# Patient Record
Sex: Female | Born: 1955
Health system: Southern US, Community
[De-identification: ages and names within clinical notes are randomized; demographics above are authoritative.]

## PROBLEM LIST (undated history)

## (undated) ENCOUNTER — Ambulatory Visit: Admission: EM

## (undated) DIAGNOSIS — R739 Hyperglycemia, unspecified: Secondary | ICD-10-CM

## (undated) DIAGNOSIS — D649 Anemia, unspecified: Secondary | ICD-10-CM

## (undated) DIAGNOSIS — E785 Hyperlipidemia, unspecified: Secondary | ICD-10-CM

## (undated) DIAGNOSIS — O159 Eclampsia, unspecified as to time period: Secondary | ICD-10-CM

## (undated) DIAGNOSIS — E669 Obesity, unspecified: Secondary | ICD-10-CM

## (undated) DIAGNOSIS — K219 Gastro-esophageal reflux disease without esophagitis: Secondary | ICD-10-CM

## (undated) DIAGNOSIS — I10 Essential (primary) hypertension: Secondary | ICD-10-CM

## (undated) HISTORY — DX: Obesity, unspecified: E66.9

## (undated) HISTORY — DX: Gastro-esophageal reflux disease without esophagitis: K21.9

## (undated) HISTORY — DX: Hyperlipidemia, unspecified: E78.5

## (undated) HISTORY — DX: Anemia, unspecified: D64.9

## (undated) HISTORY — PX: OTHER SURGICAL HISTORY: SHX169

## (undated) HISTORY — DX: Essential (primary) hypertension: I10

## (undated) HISTORY — DX: Hyperglycemia, unspecified: R73.9

## (undated) HISTORY — DX: Eclampsia, unspecified as to time period: O15.9

## (undated) HISTORY — PX: ABDOMINAL HYSTERECTOMY: SHX81

---

## 1997-09-28 ENCOUNTER — Inpatient Hospital Stay (HOSPITAL_COMMUNITY): Admission: AD | Admit: 1997-09-28 | Discharge: 1997-10-13 | Payer: Self-pay | Admitting: Obstetrics and Gynecology

## 1999-06-04 ENCOUNTER — Other Ambulatory Visit: Admission: RE | Admit: 1999-06-04 | Discharge: 1999-06-04 | Payer: Self-pay | Admitting: Obstetrics and Gynecology

## 2000-08-15 ENCOUNTER — Other Ambulatory Visit: Admission: RE | Admit: 2000-08-15 | Discharge: 2000-08-15 | Payer: Self-pay | Admitting: Obstetrics and Gynecology

## 2000-09-12 ENCOUNTER — Encounter (INDEPENDENT_AMBULATORY_CARE_PROVIDER_SITE_OTHER): Payer: Self-pay

## 2000-09-12 ENCOUNTER — Other Ambulatory Visit: Admission: RE | Admit: 2000-09-12 | Discharge: 2000-09-12 | Payer: Self-pay | Admitting: Obstetrics and Gynecology

## 2001-05-12 ENCOUNTER — Encounter: Admission: RE | Admit: 2001-05-12 | Discharge: 2001-05-12 | Payer: Self-pay | Admitting: Obstetrics and Gynecology

## 2001-05-12 ENCOUNTER — Encounter: Payer: Self-pay | Admitting: Obstetrics and Gynecology

## 2002-03-19 ENCOUNTER — Encounter: Payer: Self-pay | Admitting: Internal Medicine

## 2002-05-03 ENCOUNTER — Other Ambulatory Visit: Admission: RE | Admit: 2002-05-03 | Discharge: 2002-05-03 | Payer: Self-pay | Admitting: Obstetrics and Gynecology

## 2003-02-11 ENCOUNTER — Encounter: Admission: RE | Admit: 2003-02-11 | Discharge: 2003-02-11 | Payer: Self-pay | Admitting: Obstetrics and Gynecology

## 2003-02-11 ENCOUNTER — Encounter: Payer: Self-pay | Admitting: Obstetrics and Gynecology

## 2003-06-10 ENCOUNTER — Ambulatory Visit (HOSPITAL_COMMUNITY): Admission: RE | Admit: 2003-06-10 | Discharge: 2003-06-10 | Payer: Self-pay | Admitting: Obstetrics and Gynecology

## 2004-03-01 ENCOUNTER — Other Ambulatory Visit: Admission: RE | Admit: 2004-03-01 | Discharge: 2004-03-01 | Payer: Self-pay | Admitting: Obstetrics and Gynecology

## 2004-03-14 ENCOUNTER — Encounter: Payer: Self-pay | Admitting: Internal Medicine

## 2004-04-12 ENCOUNTER — Encounter (INDEPENDENT_AMBULATORY_CARE_PROVIDER_SITE_OTHER): Payer: Self-pay | Admitting: *Deleted

## 2004-04-12 ENCOUNTER — Ambulatory Visit (HOSPITAL_COMMUNITY): Admission: RE | Admit: 2004-04-12 | Discharge: 2004-04-12 | Payer: Self-pay | Admitting: Obstetrics and Gynecology

## 2004-05-07 ENCOUNTER — Encounter: Admission: RE | Admit: 2004-05-07 | Discharge: 2004-05-07 | Payer: Self-pay | Admitting: Internal Medicine

## 2004-05-07 ENCOUNTER — Encounter: Payer: Self-pay | Admitting: Internal Medicine

## 2004-10-23 ENCOUNTER — Ambulatory Visit: Payer: Self-pay | Admitting: Internal Medicine

## 2004-11-21 ENCOUNTER — Observation Stay (HOSPITAL_COMMUNITY): Admission: RE | Admit: 2004-11-21 | Discharge: 2004-11-22 | Payer: Self-pay | Admitting: Obstetrics and Gynecology

## 2004-11-21 ENCOUNTER — Encounter (INDEPENDENT_AMBULATORY_CARE_PROVIDER_SITE_OTHER): Payer: Self-pay | Admitting: *Deleted

## 2004-12-03 ENCOUNTER — Encounter: Payer: Self-pay | Admitting: Obstetrics and Gynecology

## 2004-12-03 ENCOUNTER — Inpatient Hospital Stay (HOSPITAL_COMMUNITY): Admission: AD | Admit: 2004-12-03 | Discharge: 2004-12-06 | Payer: Self-pay | Admitting: Obstetrics and Gynecology

## 2005-05-17 ENCOUNTER — Encounter: Admission: RE | Admit: 2005-05-17 | Discharge: 2005-05-17 | Payer: Self-pay | Admitting: Obstetrics and Gynecology

## 2005-06-02 ENCOUNTER — Emergency Department (HOSPITAL_COMMUNITY): Admission: EM | Admit: 2005-06-02 | Discharge: 2005-06-02 | Payer: Self-pay | Admitting: Family Medicine

## 2005-07-08 ENCOUNTER — Other Ambulatory Visit: Admission: RE | Admit: 2005-07-08 | Discharge: 2005-07-08 | Payer: Self-pay | Admitting: Obstetrics and Gynecology

## 2005-07-23 ENCOUNTER — Ambulatory Visit: Payer: Self-pay | Admitting: Internal Medicine

## 2005-07-30 ENCOUNTER — Ambulatory Visit: Payer: Self-pay | Admitting: Internal Medicine

## 2005-10-29 ENCOUNTER — Ambulatory Visit: Payer: Self-pay | Admitting: Internal Medicine

## 2006-05-19 ENCOUNTER — Encounter: Admission: RE | Admit: 2006-05-19 | Discharge: 2006-05-19 | Payer: Self-pay | Admitting: Obstetrics and Gynecology

## 2006-06-16 ENCOUNTER — Other Ambulatory Visit: Admission: RE | Admit: 2006-06-16 | Discharge: 2006-06-16 | Payer: Self-pay | Admitting: Obstetrics and Gynecology

## 2006-07-09 ENCOUNTER — Ambulatory Visit (HOSPITAL_COMMUNITY): Admission: RE | Admit: 2006-07-09 | Discharge: 2006-07-09 | Payer: Self-pay | Admitting: Obstetrics and Gynecology

## 2006-07-25 ENCOUNTER — Ambulatory Visit: Payer: Self-pay | Admitting: Internal Medicine

## 2006-07-25 LAB — CONVERTED CEMR LAB
ALT: 24 units/L (ref 0–40)
AST: 25 units/L (ref 0–37)
Albumin: 3.8 g/dL (ref 3.5–5.2)
Alkaline Phosphatase: 54 units/L (ref 39–117)
BUN: 6 mg/dL (ref 6–23)
Basophils Absolute: 0 10*3/uL (ref 0.0–0.1)
Basophils Relative: 0.4 % (ref 0.0–1.0)
CO2: 31 meq/L (ref 19–32)
Calcium: 9.4 mg/dL (ref 8.4–10.5)
Chloride: 106 meq/L (ref 96–112)
Chol/HDL Ratio, serum: 4.8
Cholesterol: 204 mg/dL (ref 0–200)
Creatinine, Ser: 0.9 mg/dL (ref 0.4–1.2)
Eosinophil percent: 1.7 % (ref 0.0–5.0)
GFR calc non Af Amer: 71 mL/min
Glomerular Filtration Rate, Af Am: 86 mL/min/{1.73_m2}
Glucose, Bld: 100 mg/dL — ABNORMAL HIGH (ref 70–99)
HCT: 41.7 % (ref 36.0–46.0)
HDL: 42.5 mg/dL (ref >39.0)
Hemoglobin: 14.2 g/dL (ref 12.0–15.0)
LDL DIRECT: 135.8 mg/dL
Lymphocytes Relative: 41.1 % (ref 12.0–46.0)
MCHC: 33.9 g/dL (ref 30.0–36.0)
MCV: 95.6 fL (ref 78.0–100.0)
Monocytes Absolute: 0.3 10*3/uL (ref 0.2–0.7)
Monocytes Relative: 6.9 % (ref 3.0–11.0)
Neutro Abs: 2.2 10*3/uL (ref 1.4–7.7)
Neutrophils Relative %: 49.9 % (ref 43.0–77.0)
Platelets: 328 10*3/uL (ref 150–400)
Potassium: 4.4 meq/L (ref 3.5–5.1)
RBC: 4.37 M/uL (ref 3.87–5.11)
RDW: 11.4 % — ABNORMAL LOW (ref 11.5–14.6)
Sodium: 141 meq/L (ref 135–145)
TSH: 0.8 microintl units/mL (ref 0.35–5.50)
Total Bilirubin: 1 mg/dL (ref 0.3–1.2)
Total Protein: 6.9 g/dL (ref 6.0–8.3)
Triglyceride fasting, serum: 149 mg/dL (ref 0–149)
VLDL: 30 mg/dL (ref 0–40)
WBC: 4.4 10*3/uL — ABNORMAL LOW (ref 4.5–10.5)

## 2006-08-01 ENCOUNTER — Ambulatory Visit: Payer: Self-pay | Admitting: Internal Medicine

## 2007-01-19 DIAGNOSIS — I10 Essential (primary) hypertension: Secondary | ICD-10-CM | POA: Insufficient documentation

## 2007-02-11 ENCOUNTER — Ambulatory Visit: Payer: Self-pay | Admitting: Internal Medicine

## 2007-02-17 ENCOUNTER — Telehealth: Payer: Self-pay | Admitting: Internal Medicine

## 2007-04-10 ENCOUNTER — Ambulatory Visit: Payer: Self-pay | Admitting: Internal Medicine

## 2007-04-14 ENCOUNTER — Ambulatory Visit: Payer: Self-pay | Admitting: Internal Medicine

## 2007-05-04 ENCOUNTER — Ambulatory Visit: Payer: Self-pay | Admitting: Gastroenterology

## 2007-05-18 ENCOUNTER — Ambulatory Visit: Payer: Self-pay | Admitting: Gastroenterology

## 2007-05-18 ENCOUNTER — Encounter: Payer: Self-pay | Admitting: Internal Medicine

## 2007-05-21 ENCOUNTER — Encounter: Admission: RE | Admit: 2007-05-21 | Discharge: 2007-05-21 | Payer: Self-pay | Admitting: Obstetrics and Gynecology

## 2007-06-15 ENCOUNTER — Ambulatory Visit: Payer: Self-pay | Admitting: Internal Medicine

## 2007-06-17 ENCOUNTER — Telehealth: Payer: Self-pay | Admitting: Internal Medicine

## 2007-07-23 LAB — CONVERTED CEMR LAB: Pap Smear: NORMAL

## 2007-09-09 ENCOUNTER — Ambulatory Visit: Payer: Self-pay | Admitting: Internal Medicine

## 2007-09-11 LAB — CONVERTED CEMR LAB
BUN: 12 mg/dL (ref 6–23)
CO2: 29 meq/L (ref 19–32)
Calcium: 9.9 mg/dL (ref 8.4–10.5)
Chloride: 99 meq/L (ref 96–112)
Creatinine, Ser: 0.9 mg/dL (ref 0.4–1.2)
GFR calc Af Amer: 85 mL/min
GFR calc non Af Amer: 70 mL/min
Glucose, Bld: 93 mg/dL (ref 70–99)
Potassium: 4.3 meq/L (ref 3.5–5.1)
Sodium: 138 meq/L (ref 135–145)

## 2007-10-31 IMAGING — CT CT ABDOMEN W/ CM
2 of 5 series · 13 of 32 positions shown, 18 images · IV contrast (omnipaque)
Comparison: 12/03/04

CLINICAL DATA: Right-sided lower abdominal and pelvic pain.
 ABDOMEN CT WITH CONTRAST:
TECHNIQUE: Multidetector CT imaging of the abdomen was performed following the standard protocol during bolus administration of intravenous contrast.
 Contrast:  100 cc Omnipaque 300 and oral contrast.
TECHNIQUE: Multidetector CT imaging of the pelvis was performed following the standard protocol during bolus administration of intravenous contrast.

[Series 2: abd pelvis · axial · 0.70mm/px · z∈[-372,-122]mm · 5 of 76 slices shown, 10 images]
[im 13/76  soft-tissue]
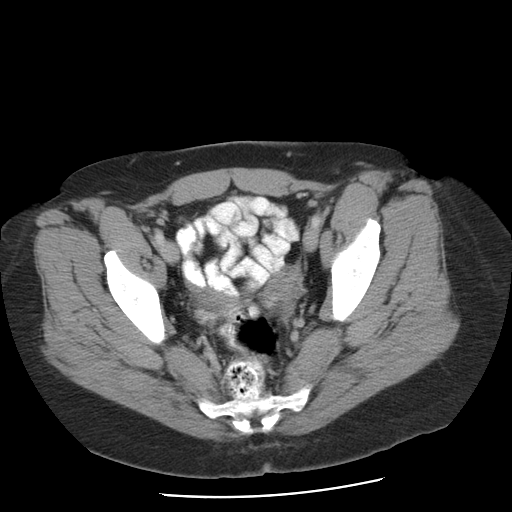
[im 13/76  bone]
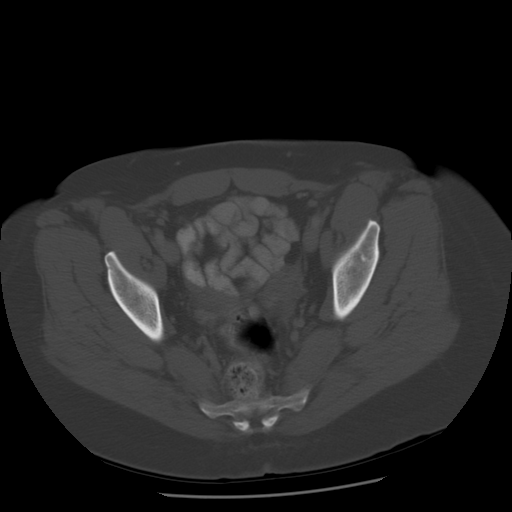
[im 26/76  soft-tissue]
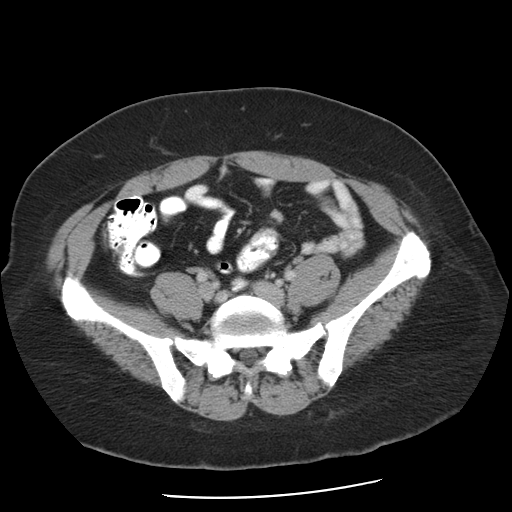
[im 26/76  lung]
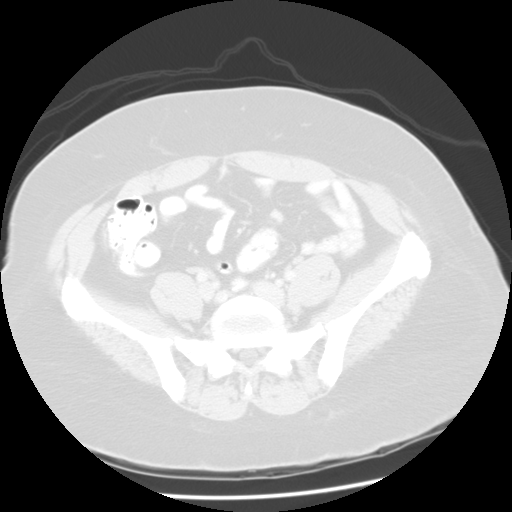
[im 38/76  soft-tissue]
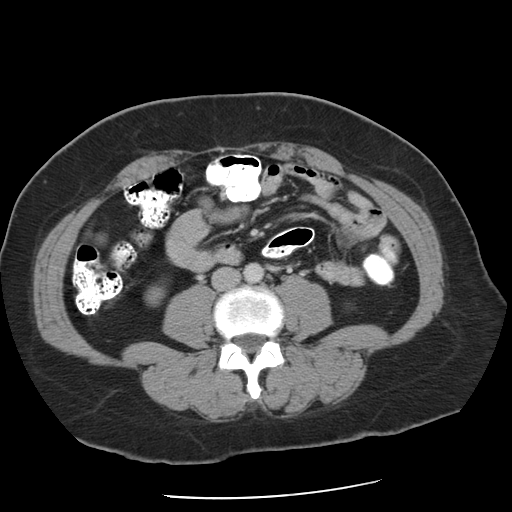
[im 38/76  lung]
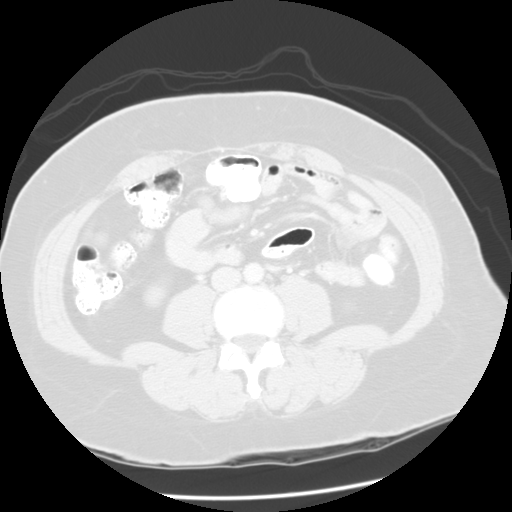
[im 51/76  soft-tissue]
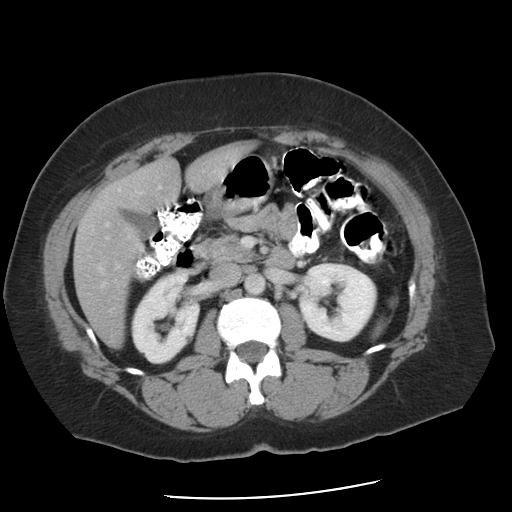
[im 51/76  lung]
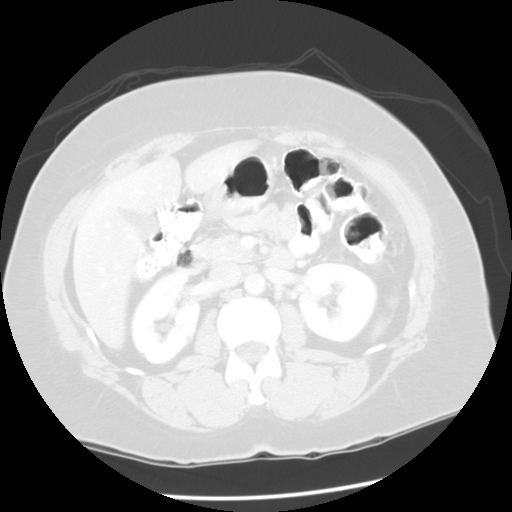
[im 63/76  soft-tissue]
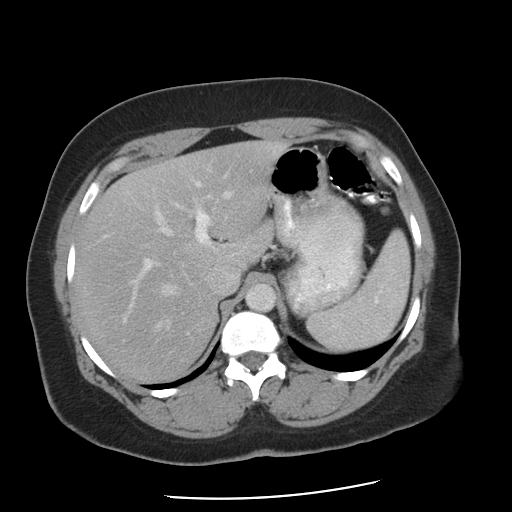
[im 63/76  lung]
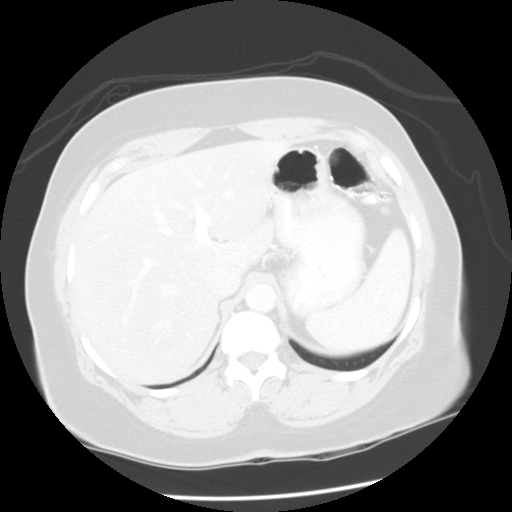

[Series 401: reformatted · sagittal · 0.80mm/px · 8 of 153 slices shown]
[im 13/153  soft-tissue]
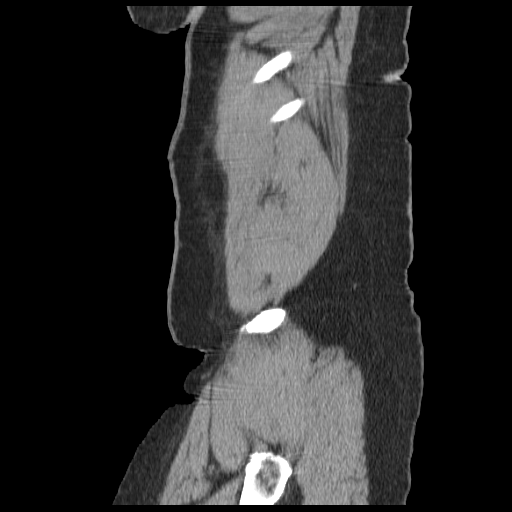
[im 39/153  soft-tissue]
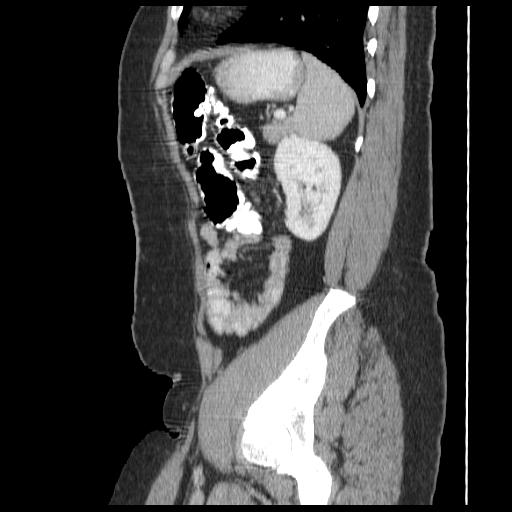
[im 51/153  soft-tissue]
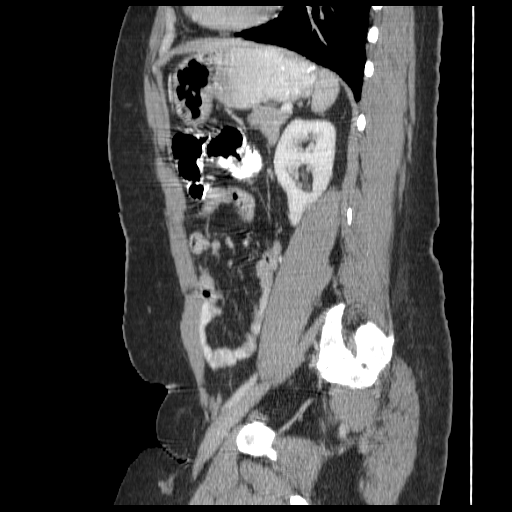
[im 64/153  soft-tissue]
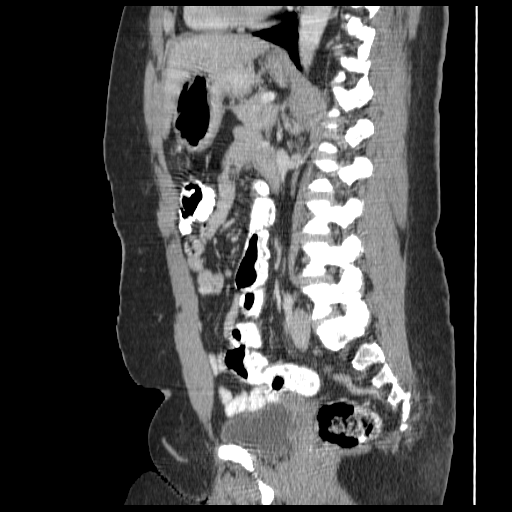
[im 89/153  soft-tissue]
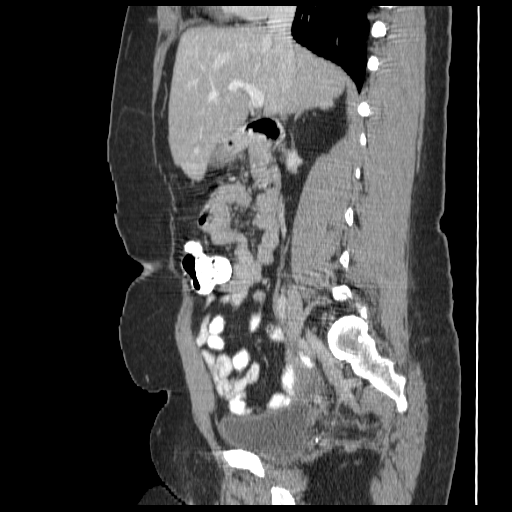
[im 102/153  soft-tissue]
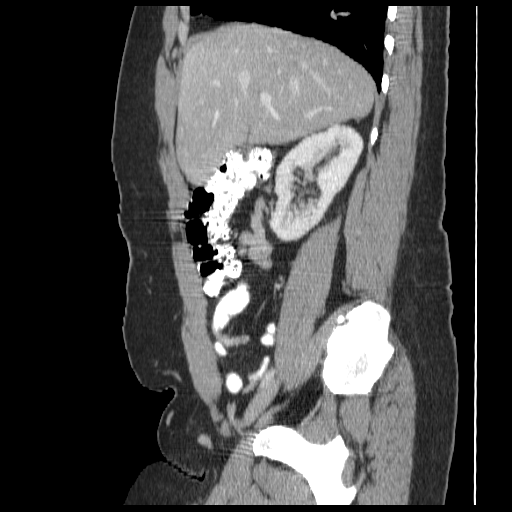
[im 115/153  soft-tissue]
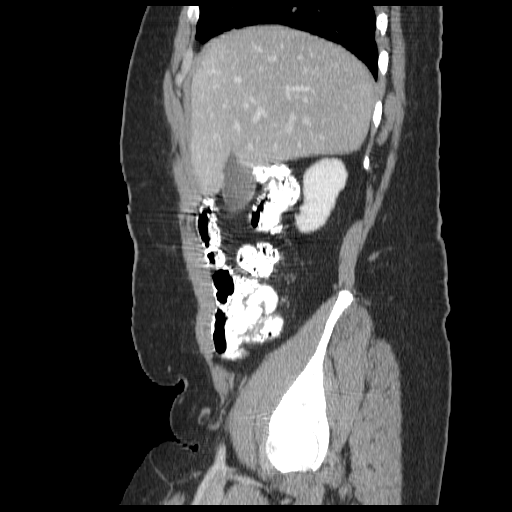
[im 140/153  soft-tissue]
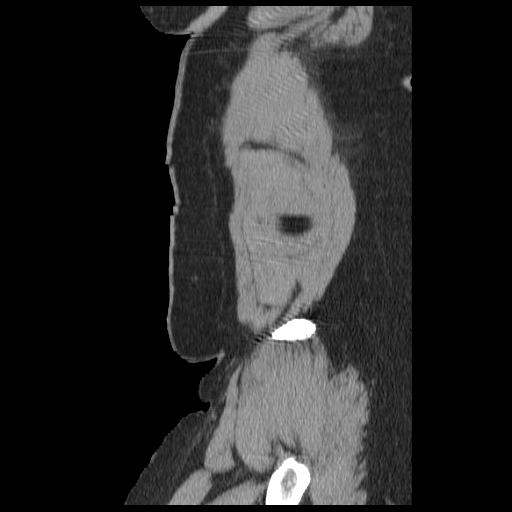

[13 of 32 positions shown; findings below may reference images not displayed]

FINDINGS: Tiny left hepatic lobe cysts remain stable. The liver is otherwise normal in appearance. Small right renal cysts are also unchanged. The abdominal parenchymal organs are otherwise normal in appearance.  There is no evidence of mass or adenopathy. There is no evidence of inflammatory process or abnormal fluid collections. The gallbladder is normal in appearance. The bowel loops are unremarkable.
IMPRESSION: 1.  No acute findings. 
 2.  Stable right renal and hepatic cysts. 
 PELVIS CT WITH CONTRAST:
FINDINGS: The patient has undergone a previous hysterectomy. There is no evidence of pelvic mass or lymphadenopathy.  There is no evidence of inflammatory process or abnormal fluid collections. Pelvic bowel loops are unremarkable in appearance. The abscess seen on the prior study has resolved.
IMPRESSION: Negative pelvis CT.

## 2008-02-26 ENCOUNTER — Ambulatory Visit: Payer: Self-pay | Admitting: Internal Medicine

## 2008-03-07 LAB — CONVERTED CEMR LAB
ALT: 24 units/L (ref 0–35)
AST: 21 units/L (ref 0–37)
Albumin: 4.1 g/dL (ref 3.5–5.2)
Alkaline Phosphatase: 61 units/L (ref 39–117)
BUN: 12 mg/dL (ref 6–23)
Basophils Absolute: 0 10*3/uL (ref 0.0–0.1)
Basophils Relative: 0.5 % (ref 0.0–3.0)
Bilirubin, Direct: 0.1 mg/dL (ref 0.0–0.3)
CO2: 31 meq/L (ref 19–32)
Calcium: 9.5 mg/dL (ref 8.4–10.5)
Chloride: 104 meq/L (ref 96–112)
Cholesterol: 173 mg/dL (ref 0–200)
Creatinine, Ser: 1.1 mg/dL (ref 0.4–1.2)
Eosinophils Absolute: 0 10*3/uL (ref 0.0–0.7)
Eosinophils Relative: 0.5 % (ref 0.0–5.0)
GFR calc Af Amer: 67 mL/min
GFR calc non Af Amer: 56 mL/min
Glucose, Bld: 112 mg/dL — ABNORMAL HIGH (ref 70–99)
HCT: 37.4 % (ref 36.0–46.0)
HDL: 44.1 mg/dL (ref >39.0)
Hemoglobin: 13 g/dL (ref 12.0–15.0)
LDL Cholesterol: 109 mg/dL — ABNORMAL HIGH (ref 0–99)
Lymphocytes Relative: 34.2 % (ref 12.0–46.0)
MCHC: 34.7 g/dL (ref 30.0–36.0)
MCV: 94.7 fL (ref 78.0–100.0)
Monocytes Absolute: 0.2 10*3/uL (ref 0.1–1.0)
Monocytes Relative: 6.9 % (ref 3.0–12.0)
Neutro Abs: 2 10*3/uL (ref 1.4–7.7)
Neutrophils Relative %: 57.9 % (ref 43.0–77.0)
Platelets: 327 10*3/uL (ref 150–400)
Potassium: 3.6 meq/L (ref 3.5–5.1)
RBC: 3.94 M/uL (ref 3.87–5.11)
RDW: 11.7 % (ref 11.5–14.6)
Sodium: 140 meq/L (ref 135–145)
TSH: 0.56 microintl units/mL (ref 0.35–5.50)
Total Bilirubin: 1.2 mg/dL (ref 0.3–1.2)
Total CHOL/HDL Ratio: 3.9
Total Protein: 7.3 g/dL (ref 6.0–8.3)
Triglycerides: 99 mg/dL (ref 0–149)
VLDL: 20 mg/dL (ref 0–40)
WBC: 3.4 10*3/uL — ABNORMAL LOW (ref 4.5–10.5)

## 2008-04-08 ENCOUNTER — Ambulatory Visit: Payer: Self-pay | Admitting: Internal Medicine

## 2008-04-08 LAB — CONVERTED CEMR LAB
Bilirubin Urine: NEGATIVE
Glucose, Urine, Semiquant: NEGATIVE
Ketones, urine, test strip: NEGATIVE
Nitrite: POSITIVE
Specific Gravity, Urine: 1.02
Urobilinogen, UA: 1
pH: 7

## 2008-04-12 ENCOUNTER — Telehealth: Payer: Self-pay | Admitting: Internal Medicine

## 2008-04-13 ENCOUNTER — Ambulatory Visit: Payer: Self-pay | Admitting: Internal Medicine

## 2008-04-19 LAB — CONVERTED CEMR LAB
Bilirubin Urine: NEGATIVE
Blood in Urine, dipstick: NEGATIVE
Glucose, Urine, Semiquant: NEGATIVE
Ketones, urine, test strip: NEGATIVE
Nitrite: NEGATIVE
Protein, U semiquant: NEGATIVE
Specific Gravity, Urine: 1.01
Urobilinogen, UA: 0.2
pH: 7

## 2008-05-23 ENCOUNTER — Encounter: Admission: RE | Admit: 2008-05-23 | Discharge: 2008-05-23 | Payer: Self-pay | Admitting: Obstetrics and Gynecology

## 2008-07-06 LAB — HM PAP SMEAR

## 2008-09-22 ENCOUNTER — Encounter: Payer: Self-pay | Admitting: Internal Medicine

## 2008-11-08 ENCOUNTER — Ambulatory Visit: Payer: Self-pay | Admitting: Internal Medicine

## 2008-11-21 ENCOUNTER — Ambulatory Visit: Payer: Self-pay

## 2008-11-21 ENCOUNTER — Encounter: Payer: Self-pay | Admitting: Internal Medicine

## 2009-04-07 ENCOUNTER — Ambulatory Visit: Payer: Self-pay | Admitting: Internal Medicine

## 2009-04-19 ENCOUNTER — Ambulatory Visit: Payer: Self-pay | Admitting: Internal Medicine

## 2009-04-21 ENCOUNTER — Telehealth: Payer: Self-pay | Admitting: *Deleted

## 2009-04-24 ENCOUNTER — Encounter: Admission: RE | Admit: 2009-04-24 | Discharge: 2009-04-24 | Payer: Self-pay | Admitting: Internal Medicine

## 2009-05-02 ENCOUNTER — Encounter: Admission: RE | Admit: 2009-05-02 | Discharge: 2009-06-13 | Payer: Self-pay | Admitting: Internal Medicine

## 2009-05-03 ENCOUNTER — Encounter: Payer: Self-pay | Admitting: Internal Medicine

## 2009-05-24 ENCOUNTER — Encounter: Admission: RE | Admit: 2009-05-24 | Discharge: 2009-05-24 | Payer: Self-pay | Admitting: Obstetrics and Gynecology

## 2009-05-29 ENCOUNTER — Ambulatory Visit: Payer: Self-pay | Admitting: Internal Medicine

## 2009-06-02 ENCOUNTER — Encounter (INDEPENDENT_AMBULATORY_CARE_PROVIDER_SITE_OTHER): Payer: Self-pay | Admitting: *Deleted

## 2009-06-13 ENCOUNTER — Encounter (INDEPENDENT_AMBULATORY_CARE_PROVIDER_SITE_OTHER): Payer: Self-pay | Admitting: *Deleted

## 2009-07-16 ENCOUNTER — Encounter: Payer: Self-pay | Admitting: Internal Medicine

## 2009-10-18 ENCOUNTER — Encounter: Payer: Self-pay | Admitting: Internal Medicine

## 2009-10-25 ENCOUNTER — Encounter: Payer: Self-pay | Admitting: Internal Medicine

## 2010-01-17 ENCOUNTER — Ambulatory Visit: Payer: Self-pay | Admitting: Family Medicine

## 2010-05-25 ENCOUNTER — Encounter: Admission: RE | Admit: 2010-05-25 | Discharge: 2010-05-25 | Payer: Self-pay | Admitting: Obstetrics and Gynecology

## 2010-05-25 LAB — HM MAMMOGRAPHY

## 2010-07-31 ENCOUNTER — Other Ambulatory Visit: Payer: Self-pay | Admitting: Internal Medicine

## 2010-07-31 ENCOUNTER — Ambulatory Visit
Admission: RE | Admit: 2010-07-31 | Discharge: 2010-07-31 | Payer: Self-pay | Source: Home / Self Care | Attending: Internal Medicine | Admitting: Internal Medicine

## 2010-07-31 LAB — LIPID PANEL
Cholesterol: 186 mg/dL (ref 0–200)
HDL: 54.7 mg/dL (ref >39.00)
LDL Cholesterol: 107 mg/dL — ABNORMAL HIGH (ref 0–99)
Total CHOL/HDL Ratio: 3
Triglycerides: 122 mg/dL (ref 0.0–149.0)
VLDL: 24.4 mg/dL (ref 0.0–40.0)

## 2010-07-31 LAB — BASIC METABOLIC PANEL
BUN: 13 mg/dL (ref 6–23)
CO2: 31 mEq/L (ref 19–32)
Calcium: 9.4 mg/dL (ref 8.4–10.5)
Chloride: 106 mEq/L (ref 96–112)
Creatinine, Ser: 0.8 mg/dL (ref 0.4–1.2)
GFR: 94.85 mL/min (ref >60.00)
Glucose, Bld: 79 mg/dL (ref 70–99)
Potassium: 4.6 mEq/L (ref 3.5–5.1)
Sodium: 142 mEq/L (ref 135–145)

## 2010-07-31 LAB — CBC WITH DIFFERENTIAL/PLATELET
Basophils Absolute: 0 10*3/uL (ref 0.0–0.1)
Basophils Relative: 0.3 % (ref 0.0–3.0)
Eosinophils Absolute: 0.1 10*3/uL (ref 0.0–0.7)
Eosinophils Relative: 1.2 % (ref 0.0–5.0)
HCT: 35.9 % — ABNORMAL LOW (ref 36.0–46.0)
Hemoglobin: 12.3 g/dL (ref 12.0–15.0)
Lymphocytes Relative: 39.9 % (ref 12.0–46.0)
Lymphs Abs: 1.7 10*3/uL (ref 0.7–4.0)
MCHC: 34.2 g/dL (ref 30.0–36.0)
MCV: 95.4 fl (ref 78.0–100.0)
Monocytes Absolute: 0.3 10*3/uL (ref 0.1–1.0)
Monocytes Relative: 7.8 % (ref 3.0–12.0)
Neutro Abs: 2.1 10*3/uL (ref 1.4–7.7)
Neutrophils Relative %: 50.8 % (ref 43.0–77.0)
Platelets: 303 10*3/uL (ref 150.0–400.0)
RBC: 3.76 Mil/uL — ABNORMAL LOW (ref 3.87–5.11)
RDW: 12.9 % (ref 11.5–14.6)
WBC: 4.2 10*3/uL — ABNORMAL LOW (ref 4.5–10.5)

## 2010-07-31 LAB — HEPATIC FUNCTION PANEL
ALT: 16 U/L (ref 0–35)
AST: 18 U/L (ref 0–37)
Albumin: 3.8 g/dL (ref 3.5–5.2)
Alkaline Phosphatase: 59 U/L (ref 39–117)
Bilirubin, Direct: 0.1 mg/dL (ref 0.0–0.3)
Total Bilirubin: 1 mg/dL (ref 0.3–1.2)
Total Protein: 6.5 g/dL (ref 6.0–8.3)

## 2010-07-31 LAB — TSH: TSH: 0.55 u[IU]/mL (ref 0.35–5.50)

## 2010-08-21 NOTE — Letter (Signed)
Summary: [facility]  [facility]   Imported By: Maryln Gottron 11/20/2009 09:52:00  _____________________________________________________________________  External Attachment:    Type:   Image     Comment:   External Document

## 2010-08-21 NOTE — Assessment & Plan Note (Signed)
Summary: abd pain/ok per dm/njr   Vital Signs:  Patient profile:   55 year old female Weight:      185 pounds O2 Sat:      91 % Temp:     98.2 degrees F Pulse rate:   93 / minute BP sitting:   108 / 78  Vitals Entered By: Pura Spice, RN (January 17, 2010 3:41 PM) CC: upper epigastric pain that started this AM states better now but area feels tender   History of Present Illness: History 33-year-old African American female complains of epigastric pain her pain up into her sternal area of her chest and has increased in severity over the past week she relates she has never had pain of this nature. She has no nausea vomiting no diarrhea no cough. On questioning she relates at home some movement she does have pain in this area  Allergies (verified): No Known Drug Allergies  Past History:  Past Medical History: Last updated: 01/19/2007 Anemia-iron deficiency Hypertension pre-eclampsia  Past Surgical History: Last updated: 01/19/2007 Hysterectomy, post op infection  Social History: Last updated: 02/11/2007 Occupation: HR mgr  Risk Factors: Smoking Status: quit > 6 months (05/29/2009)  Review of Systems      See HPI  The patient denies anorexia, fever, weight loss, weight gain, vision loss, decreased hearing, hoarseness, chest pain, syncope, dyspnea on exertion, peripheral edema, prolonged cough, headaches, hemoptysis, abdominal pain, melena, hematochezia, severe indigestion/heartburn, hematuria, incontinence, genital sores, muscle weakness, suspicious skin lesions, transient blindness, difficulty walking, depression, unusual weight change, abnormal bleeding, enlarged lymph nodes, angioedema, breast masses, and testicular masses.    Physical Exam  General:  Well-developed,well-nourished,in no acute distress; alert,appropriate and cooperative throughout examinationoverweight-appearing.   Neck:  No deformities, masses, or tenderness noted. Chest Wall:  marked tenderness over the  left costochondral area of the chest 5 through 8 Lungs:  Normal respiratory effort, chest expands symmetrically. Lungs are clear to auscultation, no crackles or wheezes. Heart:  Normal rate and regular rhythm. S1 and S2 normal without gallop, murmur, click, rub or other extra sounds. Abdomen:  Bowel sounds positive,abdomen soft and non-tender without masses, organomegaly or hernias noted. Msk:  has tenderness over the right sacroiliac joint   Impression & Recommendations:  Problem # 1:  COSTOCHONDRITIS, LEFT (ICD-733.6) Assessment New diclofenac 75 mg b.i.d.  Problem # 2:  SACROILIAC STRAIN (ICD-846.9) Assessment: New diclofenac 75 mg b.i.d. p.c.  Complete Medication List: 1)  Benazepril Hcl 20 Mg Tabs (Benazepril hcl) .... Take 1 tablet by mouth once a day 2)  Hydrochlorothiazide 25 Mg Tabs (Hydrochlorothiazide) .... Take 1 tablet by mouth once a day 3)  Amlodipine Besylate 5 Mg Tabs (Amlodipine besylate) .... Take 1 tablet by mouth once a day 4)  Diclofenac Sodium 75 Mg Tbec (Diclofenac sodium) .Marland Kitchen.. 1 two times a day, am and pm for infkamation chest wall and sacroiliac joints  Patient Instructions: 1)  CHEST PAIN IS COSTOCHONDRITIS WHICH WILL RESPOND TO DICLOFENAC AS i HVE PRESCRIBED 2)  It will also helpSaro iliac inflammation and stop tingling in feet Prescriptions: DICLOFENAC SODIUM 75 MG TBEC (DICLOFENAC SODIUM) 1 two times a day, AM and PM for infkamation chest wall AND SACROILIAC JOINTS  #60 x 5   Entered and Authorized by:   Judithann Sheen MD   Signed by:   Judithann Sheen MD on 01/17/2010   Method used:   Electronically to        [facility] [address] (retail)  [address]       [place], [place]  [id number]       Ph: [phone]       Fax: [phone]   RxID:   [id number]

## 2010-08-21 NOTE — Letter (Signed)
Summary: [facility]  [facility]   Imported By: Maryln Gottron 11/08/2009 14:01:21  _____________________________________________________________________  External Attachment:    Type:   Image     Comment:   External Document

## 2010-08-23 NOTE — Assessment & Plan Note (Signed)
Summary: med check//ccm   Vital Signs:  Patient profile:   55 year old female Weight:      188 pounds Temp:     97.7 degrees F oral Pulse rate:   68 / minute Pulse rhythm:   regular BP sitting:   128 / 80  (left arm) Cuff size:   regular  Vitals Entered By: Azucena Freed,  MA Student CC: med check    CC:  med check .  History of Present Illness:  Follow-Up Visit      This is a 55 year old woman who presents for Follow-up visit.  The patient denies chest pain and palpitations.  Since the last visit the patient notes no new problems or concerns.  The patient reports taking meds as prescribed and dietary compliance.  When questioned about possible medication side effects, the patient notes none.  has not taken BP meds in 3 days.   All other systems reviewed and were negative   Current Problems (verified): 1)  Preventive Health Care  (ICD-V70.0) 2)  Hypertension  (ICD-401.9) 3)  Anemia-iron Deficiency  (ICD-280.9)  Medications Prior to Update: 1)  Benazepril Hcl 20 Mg Tabs (Benazepril Hcl) .... Take 1 Tablet By Mouth Once A Day 2)  Hydrochlorothiazide 25 Mg  Tabs (Hydrochlorothiazide) .... Take 1 Tablet By Mouth Once A Day 3)  Amlodipine Besylate 5 Mg Tabs (Amlodipine Besylate) .... Take 1 Tablet By Mouth Once A Day 4)  Diclofenac Sodium 75 Mg Tbec (Diclofenac Sodium) .Marland Kitchen.. 1 Two Times A Day, Am and Pm For Infkamation Chest Wall and Sacroiliac Joints  Current Medications (verified): 1)  Benazepril Hcl 20 Mg Tabs (Benazepril Hcl) .... Take 1 Tablet By Mouth Once A Day 2)  Hydrochlorothiazide 25 Mg  Tabs (Hydrochlorothiazide) .... Take 1 Tablet By Mouth Once A Day 3)  Amlodipine Besylate 5 Mg Tabs (Amlodipine Besylate) .... Take 1 Tablet By Mouth Once A Day 4)  Diclofenac Sodium 75 Mg Tbec (Diclofenac Sodium) .Marland Kitchen.. 1 Two Times A Day, Am and Pm For Infkamation Chest Wall and Sacroiliac Joints  Allergies (verified): No Known Drug Allergies  Past History:  Past Medical  History: Last updated: 01/19/2007 Anemia-iron deficiency Hypertension pre-eclampsia  Family History: Last updated: 03/05/07 mother deceased 84 Family History Diabetes 1st degree relative-mother father-lever disease 41  Social History: Last updated: Mar 05, 2007 Occupation: HR mgr  Risk Factors: Smoking Status: quit > 6 months (05/29/2009)  Past Surgical History: Hysterectomy, post op infection shoulder surgery  Physical Exam  General:   overweight female in no acute distress. HEENT exam atraumatic, normocephalic symmetric muscles are intact her neck is supple. Chest clear to auscultation cardiac exam S1 and S2 are regular. Abdominal exam overweight white bowel sounds, soft. Extremities no edema.   Impression & Recommendations:  Problem # 1:  HYPERTENSION (ICD-401.9)  Her updated medication list for this problem includes:    Benazepril Hcl 20 Mg Tabs (Benazepril hcl) .Marland Kitchen... Take 1 tablet by mouth once a day    Hydrochlorothiazide 25 Mg Tabs (Hydrochlorothiazide) .Marland Kitchen... Take 1 tablet by mouth once a day    Amlodipine Besylate 5 Mg Tabs (Amlodipine besylate) .Marland Kitchen... Take 1 tablet by mouth once a day  Orders: Venipuncture ([address]109) TLB-Lipid Panel (80061-LIPID) TLB-CBC Platelet - w/Differential (85025-CBCD) TLB-BMP (Basic Metabolic Panel-BMET) (80048-METABOL) TLB-Hepatic/Liver Function Pnl (80076-HEPATIC) TLB-TSH (Thyroid Stimulating Hormone) (84443-TSH)  Complete Medication List: 1)  Benazepril Hcl 20 Mg Tabs (Benazepril hcl) .... Take 1 tablet by mouth once a day 2)  Hydrochlorothiazide 25 Mg  Tabs (Hydrochlorothiazide) .... Take 1 tablet by mouth once a day 3)  Amlodipine Besylate 5 Mg Tabs (Amlodipine besylate) .... Take 1 tablet by mouth once a day 4)  Diclofenac Sodium 75 Mg Tbec (Diclofenac sodium) .Marland Kitchen.. 1 two times a day, am and pm for infkamation chest wall and sacroiliac joints  Patient Instructions: 1)  . Prescriptions: AMLODIPINE BESYLATE 5 MG TABS (AMLODIPINE  BESYLATE) Take 1 tablet by mouth once a day  #90 x 3   Entered and Authorized by:   Birdie Sons MD   Signed by:   Birdie Sons MD on 07/31/2010   Method used:   Electronically to        [facility] (retail)       [address]       [place], [place]  [id number]       Ph: [phone]       Fax: [phone]   RxID:   [id number] HYDROCHLOROTHIAZIDE 25 MG  TABS (HYDROCHLOROTHIAZIDE) Take 1 tablet by mouth once a day  #90 Tablet x 3   Entered and Authorized by:   Birdie Sons MD   Signed by:   Birdie Sons MD on 07/31/2010   Method used:   Electronically to        [facility] (retail)       [address]       [place], [place]  [id number]       Ph: [phone]       Fax: [phone]   RxID:   [id number] BENAZEPRIL HCL 20 MG TABS (BENAZEPRIL HCL) Take 1 tablet by mouth once a day  #90 x 3   Entered and Authorized by:   Birdie Sons MD   Signed by:   Birdie Sons MD on 07/31/2010   Method used:   Electronically to        [facility] (retail)       [address]       [place], [place]  [id number]       Ph: [phone]       Fax: [phone]   RxID:   [id number]    Orders Added: 1)  Venipuncture [62694] 2)  TLB-Lipid Panel [80061-LIPID] 3)  TLB-CBC Platelet - w/Differential [85025-CBCD] 4)  TLB-BMP (Basic Metabolic Panel-BMET) [80048-METABOL] 5)  TLB-Hepatic/Liver Function Pnl [80076-HEPATIC] 6)  TLB-TSH (Thyroid Stimulating Hormone) [84443-TSH] 7)  Est. Patient Level III [85462]  Appended Document: Orders Update     Clinical Lists Changes  Orders: Added new Service order of Specimen Handling (70350) - Signed

## 2010-12-07 NOTE — H&P (Signed)
NAMEYENNIFER, Erica Sutton                  ACCOUNT NO.:  [id number]   MEDICAL RECORD NO.:  [id number]          PATIENT TYPE:  INP   LOCATION:  9320                          [facility]ILITY:  [facility]   PHYSICIAN:  Janine Limbo, M.D.DATE OF BIRTH:  [birth year]   DATE OF ADMISSION:  12/03/2004  DATE OF DISCHARGE:                                HISTORY & PHYSICAL   HISTORY OF PRESENT ILLNESS:  [name] is a 55 year old female, para 3-1-1-3,  who presents complaining of abdominal pain and fever.  The patient had an  uneventful vaginal hysterectomy on Nov 21, 2004.  Operative findings included  a fibroid uterus.  The patient reports that on Dec 02, 2004, she had a  temperature of 103.  She presented to the office today for evaluation.  She  reports that she is having constipation, but no rectal bleeding. She is  having daily bowel movements.  She denies nausea and vomiting.  She denies  urinary tract infection symptoms.   PAST MEDICAL HISTORY:  Significant for hysteroscopy with dilatation and  curettage in September of 2005.  Her pathology report was benign.  The  patient had a normal Pap smear in August of 2005.  The patient has  hypertension.   OBSTETRICAL HISTORY:  The patient had a cesarean section in 1999.  She has  had three normal spontaneous vaginal deliveries.  She had a miscarriage in  1988.   ALLERGIES:  No known drug allergies.   CURRENT MEDICATIONS:  1. Norvasc 5 mg each day.  2. Toprol 50 mg each day.     SOCIAL HISTORY:  The patient denies cigarette use, alcohol use, and  recreational drug use.   REVIEW OF SYSTEMS:  Please see history of present illness.   FAMILY HISTORY:  Noncontributory.   PHYSICAL EXAMINATION:  VITAL SIGNS:  Temperature 98.3, pulse 93,  respirations 18, blood pressure 147/99.  HEENT:  Within normal limits.  CHEST:  Clear.  HEART:  Regular rate and rhythm.  ABDOMEN:  Soft and nontender.  EXTREMITIES:  Grossly normal.  NEUROLOGY:  Grossly normal.  PELVIC:  External genitalia is normal.  Vagina is normal.  Cervix is absent.  Uterus is absent.  The bimanual examination shows appropriate tenderness,  but no masses were appreciated.   LABORATORY DATA:  Hemoglobin 10.5, white blood cell count 16,200, hematocrit  33.4%, platelet count 582,000.  Chemistries are within normal limits except  for a glucose of 101.  CT scan of the abdomen and pelvis shows a 6 cm pelvic  mass at the vaginal cuff consistent with a pelvic abscess.  Bowel was noted  anteriorly such that the radiologist felt that it would be difficult to do a  transabdominal drainage of the mass.  Large blood vessels were noted  posteriorly and there was concern that a gluteal approach to the mass would  be difficult.   ASSESSMENT:  1. Pelvic abscess.  2. Status post vaginal hysterectomy on Nov 21, 2004.  3. Hypertension.     PLAN:  The patient will be admitted for IV Zosyn.  She will be  made NPO  after midnight tonight.  I have spoken with the radiologist and they will  consider trying a transvaginal approach to the pelvic abscess.  If they feel  that they will not be able to perform this procedure, then we will consider  taking the patient back to the operating room and opening the vaginal cuff  to drain the abscess.      AVS/MEDQ  D:  12/03/2004  T:  12/03/2004  Job:  [id number]

## 2010-12-07 NOTE — H&P (Signed)
NAME:  Erica Sutton, Erica Sutton                  ACCOUNT NO.:  [id number]   MEDICAL RECORD NO.:  [id number]          PATIENT TYPE:  INP   LOCATION:  NA                            [facility]   PHYSICIAN:  Janine Limbo, M.D.DATE OF BIRTH:  Oct 03, 1955   DATE OF ADMISSION:  11/22/2004  DATE OF DISCHARGE:                                HISTORY & PHYSICAL   HISTORY OF PRESENT ILLNESS:  Erica Sutton is a 55 year old female, para 3-1-1-3,  who presents for a vaginal hysterectomy. The patient has been followed at  the [facility] division of [facility]. She  complains of menorrhagia, anemia, and fibroids. The patient had hysteroscopy  and dilatation and curettage in September of 2005. The pathology report  returned showing microglandular hyperplasia. There was no evidence of  malignancy. An ultrasound was performed consistent with the above-mentioned  diagnosis. The patient's most recent Pap smear was in August of 2005 and it  was within normal limits.   The patient has not responded to hormonal therapy nor nonsteroidal anti-  inflammatory agents.   OBSTETRICAL HISTORY:  The patient had a cesarean section in 1999 for  premature infant. Prior to that, she has had three normal spontaneous  vaginal deliveries. She had a miscarriage in 1998 in the first trimester.   ALLERGIES:  None known.   PAST MEDICAL HISTORY:  The patient has hypertension.   CURRENT MEDICATIONS:  Norvasc, Toprol, and iron therapy.   SOCIAL HISTORY:  The patient denies cigarette use, alcohol use, and  recreational drug use.   REVIEW OF SYMPTOMS:  The patient complains of fatigue as well as irregular  cycles.   FAMILY HISTORY:  Noncontributory.   PHYSICAL EXAMINATION:  VITAL SIGNS:  Weight is 192 pounds.  HEENT:  Within normal limits.  CHEST:  Clear.  HEART:  Regular rate and rhythm.  BREASTS:  Without masses.  ABDOMEN:  Nontender.  EXTREMITIES:  Within normal limits.  NEUROLOGICAL:   Grossly normal.  PELVIC:  External genitalia is normal. The vagina is normal. Cervix is  nontender. The uterus is 10 to 12 weeks size and irregular. Adnexa has no  masses. Rectovaginal exam confirms.   ASSESSMENT:  1.  Menorrhagia.  2.  Anemia.  3.  Fibroid uterus.   PLAN:  The patient will undergo a vaginal hysterectomy. She wishes to keep  her ovaries if they appear normal but agrees to have an oophorectomy  performed if they are abnormal. The patient understands the indications for  her procedure and she accepts the risks of, but not limited to, anesthetic  complications, bleeding, infections, and possible damage to the surrounding  organs.      AVS/MEDQ  D:  11/20/2004  T:  11/20/2004  Job:  [id number]

## 2010-12-07 NOTE — H&P (Signed)
NAME:  Erica Sutton, Erica Sutton                  ACCOUNT NO.:  [id number]   MEDICAL RECORD NO.:  [id number]          PATIENT TYPE:  INP   LOCATION:  NA                            [facility]   PHYSICIAN:  Janine Limbo, M.D.DATE OF BIRTH:  Nov 28, 1955   DATE OF ADMISSION:  11/22/2004  DATE OF DISCHARGE:                                HISTORY & PHYSICAL   HISTORY OF PRESENT ILLNESS:  Ms. Storbeck is a 55 year old female, para 3-1-1-3,  who presents for a vaginal hysterectomy because of menorrhagia, anemia, and  fibroids. The patient has been followed at the [facility]  division of [facility]. The patient has had a NovaSure  endometrial ablation but this did not stop her bleeding.   Dictation ended at this point.      AVS/MEDQ  D:  11/20/2004  T:  11/20/2004  Job:  [id number]

## 2010-12-07 NOTE — Discharge Summary (Signed)
NAMEDREMA, EDDINGTON                  ACCOUNT NO.:  [id number]   MEDICAL RECORD NO.:  [id number]          PATIENT TYPE:  OBV   LOCATION:  9310                          [facility]   PHYSICIAN:  Janine Limbo, M.D.DATE OF BIRTH:  [birth year]   DATE OF ADMISSION:  11/21/2004  DATE OF DISCHARGE:  11/22/2004                                 DISCHARGE SUMMARY   DISCHARGE DIAGNOSES:  1.  Adenomyosis.  2.  Endometrial polyp.  3.  Menometrorrhagia.  4.  Anemia (hemoglobin 10.9).   OPERATION:  On the date of admission the patient underwent a vaginal  hysterectomy, tolerating procedure well. The patient was found to have an  enlarged uterus measuring 10- to 12-week size with normal-appearing tubes  and ovaries bilaterally.   HISTORY OF PRESENT ILLNESS:  Ms. Groesbeck is a 55 year old female para 3-1-1-3  who presents for vaginal hysterectomy because of menometrorrhagia, severe  anemia, and questionable uterine fibroids. Please see the patient's dictated  History and Physical Examination for details.   PREOPERATIVE PHYSICAL EXAMINATION:  VITAL SIGNS:  Weight is 192 pounds.  GENERAL:  Within normal limits.  PELVIC:  External genitalia is normal, vagina is normal, cervix is  nontender. Uterus is 10- to 12-week size and irregular. Adnexa without  masses. Rectovaginal exam confirms.   HOSPITAL COURSE:  On the date of admission the patient underwent  aforementioned procedure, tolerating it well. Postoperative course was  unremarkable with the patient tolerating a postoperative hemoglobin of 8.5  (preoperative hemoglobin 10.9). By postoperative day #1 the patient had  resumed bowel and bladder function and was therefore deemed ready for  discharge home.   DISCHARGE MEDICATIONS:  1.  Vicodin one to two tablets q.4h. as needed for pain.  2.  Iron 325 mg one tablet q.12h. for 6 weeks.  3.  Motrin 200 mg three tablets q.6h. with food as needed for pain.   FOLLOW-UP:  The patient is to call  [facility] for an  appointment with Dr. Stefano Gaul in 6 weeks.   DISCHARGE INSTRUCTIONS:  The patient was given a copy of [facility] postoperative instruction sheet. She was further advised to call for  temperature greater than 100.4 or severe problems, not to drive for 2 weeks,  no heavy lifting for 4 weeks, and no intercourse for 6 weeks. The patient's  diet was without restrictions.   FINAL PATHOLOGY:  Uterus, excision:  Benign cervical mucosa with nabothian  cyst, no dysplasia identified. Findings consistent with infarcted  endometrial polyp, benign proliferative endometrium, extensive adenomyosis,  serosa with no pathologic abnormalities.       EJP/MEDQ  D:  12/20/2004  T:  12/20/2004  Job:  [id number]

## 2010-12-07 NOTE — H&P (Signed)
NAME:  Erica Sutton, Erica Sutton NO.:  [id number]   MEDICAL RECORD NO.:  [id number]          PATIENT TYPE:  AMB   LOCATION:  SDC                           FACILITY:  [facility]   PHYSICIAN:  Janine Limbo, M.D.DATE OF BIRTH:  10-15-1955   DATE OF ADMISSION:  04/12/2004  DATE OF DISCHARGE:                                HISTORY & PHYSICAL   HISTORY OF PRESENT ILLNESS:  Erica Sutton is a 55 year old female, para 3-1-1-3,  who presents for uterine curettage and endometrial ablation, using NovaSure.  The patient complains of menometrorrhagia.  Gonorrhea and Chlamydia cultures  of the cervix have been negative.  The patient had an endometrial biopsy in  January of 2005 that showed benign elements.  An ultrasound of the pelvis  was performed, including a sonohistogram, which showed a very small area in  the endometrium which measured 0.51 cm that may represent a polyp.  The  patient's most recent Pap smear was in August of 2005, and it was within  normal limits.   OBSTETRICAL HISTORY:  The patient had a cesarean section in 1999 for a  premature infant.  Prior to that, she had 3 normal spontaneous vaginal  deliveries.  She also had a miscarriage in 1998 in the first trimester.   DRUG ALLERGIES:  None known.   PAST MEDICAL HISTORY:  The patient has hypertension.   CURRENT MEDICATIONS:  1.  Norvasc.  2.  Toprol.  3.  Iron.   SOCIAL HISTORY:  The patient denies cigarettes use, alcohol use, and  recreational drug use.   REVIEW OF SYSTEMS:  The patient complains of irregular bleeding and fatigue  (hemoglobin 8.8).   FAMILY HISTORY:  Noncontributory.   PHYSICAL EXAMINATION:  VITAL SIGNS:  Weight is 192 pounds.  HEENT:  Within normal limits.  CHEST:  Clear.  HEART:  Regular rate and rhythm.  BREASTS:  Without masses.  ABDOMEN : Nontender.  EXTREMITIES:  Within normal limits.  NEUROLOGIC:  Grossly normal.  PELVIC:  External genitalia is normal.  Vagina is normal.  Cervix is  nontender.  The uterus is 10-12 weeks size.  Adnexa - no masses.  RECTOVAGINAL:  Confirms.   ASSESSMENT:  1.  Menometrorrhagia.  2.  Anemia.  3.  Questionable small endometrial polyp.   PLAN:  The patient will undergo hysteroscopy with uterine curettage.  She  will also undergo NovaSure endometrial ablation.  The patient understands  the indications for her procedure, and she accepts the risk of, but not  limited to, anesthetic complications, bleeding, infections, and possible  damage to the surrounding organs.      AVS/MEDQ  D:  04/10/2004  T:  04/10/2004  Job:  [id number]

## 2010-12-07 NOTE — Op Note (Signed)
Erica Sutton, Erica Sutton                  ACCOUNT NO.:  [id number]   MEDICAL RECORD NO.:  [id number]          PATIENT TYPE:  INP   LOCATION:  9320                          FACILITY:  [facility]   PHYSICIAN:  Janine Limbo, M.D.DATE OF BIRTH:  1955-10-03   DATE OF PROCEDURE:  12/04/2004  DATE OF DISCHARGE:                                 OPERATIVE REPORT   PREOPERATIVE DIAGNOSIS:  Pelvic abscess.   POSTOPERATIVE DIAGNOSIS:  Pelvic abscess.   PROCEDURE:  Vaginal drainage of pelvic abscess.   SURGEON:  Janine Limbo, M.D.   FIRST ASSISTANT:  Osborn Coho, M.D.   ANESTHETIC:  General.   DISPOSITION:  Erica Sutton is a 55 year old female who had a vaginal  hysterectomy on Nov 21, 2004.  She presented on Dec 03, 2004 complaining of  fever and abdominal pain. A CT scan showed a 6 cm pelvic abscess at the  vaginal cuff. Her white blood cell count was elevated to 16,200. The patient  was offered radiologic drainage of the abscess. The radiologist felt,  however that transabdominal and transgluteal drainage were not appropriate  options. They also felt that vaginal drainage via radiologic techniques were  not appropriate for this patient. For that reason we elected to proceed with  operative vaginal drainage. The patient understands the indications for  procedure and she accepts the risk of, but not limited to, anesthetic  complications, bleeding, infection, and possible damage to surrounding  organs.   FINDINGS:  100 mL of blood-tinged fluid was removed from the vaginal cuff.  No purulent material was actually identified. The procedure was monitored  using the ultrasound and the ovaries appeared normal. We were able to  visualize her right ovary through the vagina. It appeared normal. The left  ovary could be palpated and it palpated normally.   PROCEDURE:  The patient was taken to the operating room where general  anesthetic was given. The patient's abdomen, perineum, and vagina were  prepped with multiple layers of Betadine. Foley catheter was placed in the  bladder but then was clamped. The patient was sterilely draped. Examination  under anesthesia was performed. The vaginal cuff was opened and 100 mL of  old blood and serosanguineous fluid was drained from the cuff of the vagina.  The vagina was then explored digitally and using a Babcock clamp. No other  masses were appreciated. Ultrasound was used and the large fluid collection  in the pelvis was noted to resolve. Hemostasis was felt to be adequate. The  vaginal cuff was then sutured using a running locking suture of zero Vicryl.  The cuff was left open. The pelvis was vigorously drained with a liter of  normal saline. Sponge, needle, instrument counts were correct. The  Foley catheter was unclamped and the patient was noted to drain clear yellow  urine. Estimated blood loss for the procedure was 10 mL. The patient  tolerated her procedure well. She was awakened from her anesthetic and taken  to the recovery room in stable condition.      AVS/MEDQ  D:  12/04/2004  T:  12/04/2004  Job:  [id number]

## 2010-12-07 NOTE — Op Note (Signed)
NAMEGEORGANNE, SIPLE                  ACCOUNT NO.:  [id number]   MEDICAL RECORD NO.:  [id number]          PATIENT TYPE:  INP   LOCATION:  9399                          [facility]ACILITY:  [facility]   PHYSICIAN:  Janine Limbo, M.D.DATE OF BIRTH:  1955-11-05   DATE OF PROCEDURE:  11/21/2004  DATE OF DISCHARGE:                                 OPERATIVE REPORT   PREOPERATIVE DIAGNOSES:  1.  Fibroid uterus.  2.  Menometrorrhagia.  3.  Anemia (hemoglobin 10.9).   POSTOPERATIVE DIAGNOSES:  1.  Fibroid uterus.  2.  Menometrorrhagia.  3.  Anemia (hemoglobin 10.9).   OPERATION/PROCEDURE:  Vaginal hysterectomy.   SURGEON:  Janine Limbo, M.D.   FIRST ASSISTANT:  Naima A. Normand Sloop, M.D.   ANESTHESIA:  General.   DISPOSITION:  Mrs. Barna is a 55 year old female who presents with the above-  mentioned diagnosis.  She has not responded to hormonal therapy,  nonsteroidal therapy, nor dilatation and curettage.  She understands the  indications for her procedure and she accepts the risks of but not limited  to anesthetic complications, bleeding, infections, and possible damage to  the surrounding organs.   FINDINGS:  A 10-week size fibroid uterus was present.  The fallopian tubes  and the ovaries were normal.   DESCRIPTION OF PROCEDURE:  The patient was taken to the operating room where  a general anesthesia was given.  The patient's lower abdomen, perineum, and  vagina were prepped with multiple areas of Betadine.  The examination under  anesthesia was performed.  A Foley catheter was placed in the bladder.  The  patient was sterilely draped.  A tenaculum was placed in the cervix. The  cervix was injected with 30 mL of diluted solution of Pitressin and saline.  A circumferential incision was made around the cervix and the vaginal mucosa  was advanced both anteriorly and posteriorly.  The posterior cul-de-sac was  sharply entered.  Alternating from left to right, the uterosacral ligaments,  paracervical tissues, parametrial tissues, and the uterine arteries were  clamped, cut, sutured, and tied securely.  The uterus was inverted through  the posterior colpotomy.  We then pushed the bladder out of the way.  We  sharply entered the anterior cul-de-sac.  The upper pedicles were then  secured and the uterus was transected from the apex of the vagina.  The  upper pedicles were then suture ligated.  Hemostasis was adequate.  The  sutures attached to the uterosacral ligaments were brought out through the  vaginal angles and tied securely.  A McCall culdoplasty suture was placed in  the posterior cul-de-sac incorporating the posterior cul-de-sac and the  uterosacral ligaments bilaterally.  There was noted to be a small amount of  bleeding from the posterior cuff of vagina and hemostasis was achieved using  figure-of-eight sutures.  We then felt that hemostasis was adequate  throughout.  The vaginal cuff was closed using figure-of-eight sutures  incorporating the anterior vaginal mucosa, the anterior peritoneum, the  posterior peritoneum, and the posterior vaginal mucosa.  Hemostasis was  again noted to be adequate.  The McCall culdoplasty suture was tied securely  and the apex of the vagina was noted to elevate into the mid pelvis.  The  patient was awakened from her anesthesia and taken to the recovery room in  stable condition.  0 Vicryl was the suture material used throughout the  procedure.  Sponge, needle and instrument counts were correct on two  occasions.      AVS/MEDQ  D:  11/21/2004  T:  11/21/2004  Job:  [id number]

## 2010-12-07 NOTE — Discharge Summary (Signed)
Sutton, Erica                  ACCOUNT NO.:  [id number]   MEDICAL RECORD NO.:  [id number]          PATIENT TYPE:  INP   LOCATION:  9320                          [facility]ACILITY:  [facility]   PHYSICIAN:  Janine Limbo, M.D.DATE OF BIRTH:  [birth year]   DATE OF ADMISSION:  12/03/2004  DATE OF DISCHARGE:  12/06/2004                                 DISCHARGE SUMMARY   DISCHARGE DIAGNOSES:  1.  Pelvic abscess.  2.  Status post total vaginal hysterectomy.   OPERATION:  On hospital day #2, the patient underwent the vaginal drainage  of a pelvic abscess, tolerating procedure well. The patient was found to  have 100 mL of fluid which was drained from the patient's vaginal cuff.   HISTORY OF PRESENT ILLNESS:  [name] is a 55 year old female para 3-1-1-3  who presents to [facility] with complaints of abdominal pain  and fever. The patient had had an uneventful vaginal hysterectomy on Nov 21, 2004. However, she also had on the day prior to presentation a temperature  of 103 degrees Fahrenheit orally. The patient was admitted for further  evaluation of her symptoms. Please see the patient's dictated History and  Physical Examination for details.   PREOPERATIVE PHYSICAL EXAMINATION:  VITAL SIGNS:  Blood pressure 147/99,  temperature 98.3, pulse 93, respirations 18.  GENERAL:  Within normal limits.  PELVIC:  External genitalia is normal, vagina is normal, cervix is absent,  uterus is absent. The bimanual exam showed appropriate tenderness but no  masses were appreciated.   ADMISSION LABORATORY DATA:  White blood count 16,200; hemoglobin 10.5;  hematocrit 33.4%; platelet count 582,000. Blood chemistries were within  normal limits with the exception of a glucose of 101. CT scan of the abdomen  and pelvis revealed a 6 cm pelvic mass in the vaginal cuff consistent with a  pelvic abscess.   HOSPITAL COURSE:  On the date of admission the patient was placed on IV  antibiotics and given  the results of her abdominopelvic CT scan which showed  a 6 cm pelvic mass of the vaginal cuff. The patient was given the option of  IV therapy versus IV therapy with abscess drainage. The patient opted for  the latter, and on hospital day #2 proceeded with vaginal drainage of her  pelvic abscess. The patient tolerated the procedure well along with her  postoperative hemoglobin of 8.3 (preoperative hemoglobin 8.6). By  postoperative day #1 (hospital day #3) the patient's white blood count had  decreased to 7900 with both urine and blood cultures being negative for  growth. By postoperative day #2 and hospital day #4, the patient had resumed  the maximum benefit of her hospital stay, was afebrile, and admitted to  minimal pain, and was therefore deemed ready for discharge home.   DISCHARGE MEDICATIONS:  1.  Vicodin one to two tablets q.4h. as needed for pain.  2.  Iron 325 mg one tablet twice daily for 6 weeks.  3.  Augmentin 500 mg one tablet twice daily for 10 days.   FOLLOW-UP:  The patient has a  2-week follow-up with Dr. Stefano Gaul at [facility].   DISCHARGE INSTRUCTIONS:  The patient was advised to call for any temperature  greater than 100.4 or other problems. Her diet was without restriction.       EJP/MEDQ  D:  12/20/2004  T:  12/20/2004  Job:  [id number]

## 2010-12-07 NOTE — Op Note (Signed)
NAME:  Erica Sutton, Erica Sutton                  ACCOUNT NO.:  [id number]   MEDICAL RECORD NO.:  [id number]          PATIENT TYPE:  AMB   LOCATION:  SDC                           [facility]ILITY:  [facility]   PHYSICIAN:  Janine Limbo, M.D.DATE OF BIRTH:  1956/02/17   DATE OF PROCEDURE:  04/12/2004  DATE OF DISCHARGE:                                 OPERATIVE REPORT   PREOPERATIVE DIAGNOSES:  1.  Fibroid uterus.  2.  Anemia (hemoglobin 7.8).  3.  Menorrhagia.   POSTOPERATIVE DIAGNOSES:  1.  Fibroid uterus.  2.  Anemia (hemoglobin 7.8).  3.  Menorrhagia.   PROCEDURE:  1.  Dilatation and curettage.  2.  Endometrial ablation using Novasure.  3.  Hysteroscopy.   ANESTHESIA:  Monitored anesthetic control, paracervical block using 0.5%  Marcaine with epinephrine.   DISPOSITION:  Erica Sutton is a 55 year old female who presents with the above  diagnosis.  She understands the indications for her procedure and she  accepts the risk of, but not limited to, anesthetic complications, bleeding,  infections and possible damage to the surrounding organs.   FINDINGS:  The uterus was 10-12 week size and it was irregular. The uterus  was firm and this was felt to be consistent with a fibroid uterus.  Adnexa  had no masses.  The uterus sounded to 10 cm. The endocervix sounded to 4 cm.  A large amount of endometrial tissue was obtained on curettage.   DESCRIPTION OF PROCEDURE:  The patient was taken to the operating room where  she was given medication through her IV line. The patient's abdomen,  perineum and vagina were prepped with multiple layers of Betadine. The  bladder was drained of urine.  The patient was then sterilely draped.  Examination under anesthesia was performed. A paracervical block was placed  using 10 mL of 0.5% Marcaine with epinephrine.  An endocervical curettage  was performed. The endocervix sounded to 4 cm.  The uterus sounded to 10 cm.  The cervix was grasped with a dilator.  The uterine  cavity was then curetted  using a medium sharp curette.  The Novasure device was then inserted  according to instructions.  The cavity was then ablated using the Novasure.  The cavity width was noted to be 4.7 cm.  The power gauge was set at 155.  The total ablation time was 1 minute and 23 seconds. The patient tolerated  her procedure well. After the ablation was completed, the diagnostic  hysteroscope was inserted and the cavity was inspected.  The endometrial  cavity was noted to be nicely ablated. No lesions were seen.  All  instruments were removed.  Silver nitrate was placed on the cervix where  there was a small amount of bleeding from the single tooth tenaculum.  Examination was repeated and again the uterus was noted to be firm and 10-12  week size. Sponge, needle and instrument counts were correct.  The estimated  blood loss was less than 5 mL.  The estimated fluid deficit from the  hysteroscopy was less than 5 mL.  The patient tolerated  her procedure well.  She was awakened from her anesthetic without difficulty and taken to the  recovery room in stable condition.   FOLLOW UP:  The patient was given a prescription for Vicodin and she will  take 1 or 2 tablets every 4 hours as she needs it for severe pain.  She will  use ibuprofen 600 mg every 6 hours for mild to moderate pain. She was given  a copy of the postoperative instruction sheet as prepared by the [facility] for patients who have undergone a dilatation and  curettage.  She will return to see Dr. Stefano Gaul in 2-3 weeks for a followup  examination.  She was told to refrain from intercourse until all bleeding  and discharge had stopped.  She will call for questions or concerns.      AVS/MEDQ  D:  04/12/2004  T:  04/13/2004  Job:  [id number]

## 2011-05-06 ENCOUNTER — Other Ambulatory Visit: Payer: Self-pay | Admitting: Obstetrics and Gynecology

## 2011-05-06 DIAGNOSIS — Z1231 Encounter for screening mammogram for malignant neoplasm of breast: Secondary | ICD-10-CM

## 2011-05-28 ENCOUNTER — Ambulatory Visit
Admission: RE | Admit: 2011-05-28 | Discharge: 2011-05-28 | Disposition: A | Discharge status: Home / Self Care | Payer: 59 | Source: Ambulatory Visit | Attending: Obstetrics and Gynecology | Admitting: Obstetrics and Gynecology

## 2011-05-28 DIAGNOSIS — Z1231 Encounter for screening mammogram for malignant neoplasm of breast: Secondary | ICD-10-CM

## 2011-10-25 ENCOUNTER — Other Ambulatory Visit: Payer: Self-pay | Admitting: Internal Medicine

## 2011-11-11 ENCOUNTER — Other Ambulatory Visit (INDEPENDENT_AMBULATORY_CARE_PROVIDER_SITE_OTHER): Payer: 59

## 2011-11-11 DIAGNOSIS — Z0000 Encounter for general adult medical examination without abnormal findings: Secondary | ICD-10-CM

## 2011-11-11 LAB — CBC WITH DIFFERENTIAL/PLATELET
Basophils Absolute: 0 10*3/uL (ref 0.0–0.1)
Eosinophils Absolute: 0.1 10*3/uL (ref 0.0–0.7)
Hemoglobin: 13.6 g/dL (ref 12.0–15.0)
Lymphocytes Relative: 41.2 % (ref 12.0–46.0)
MCHC: 34.2 g/dL (ref 30.0–36.0)
Monocytes Relative: 7.8 % (ref 3.0–12.0)
Neutrophils Relative %: 48.7 % (ref 43.0–77.0)
Platelets: 290 10*3/uL (ref 150.0–400.0)
RDW: 12.8 % (ref 11.5–14.6)

## 2011-11-11 LAB — BASIC METABOLIC PANEL
CO2: 25 mEq/L (ref 19–32)
Calcium: 9.3 mg/dL (ref 8.4–10.5)
Creatinine, Ser: 1 mg/dL (ref 0.4–1.2)
GFR: 75.77 mL/min (ref >60.00)
Glucose, Bld: 97 mg/dL (ref 70–99)
Sodium: 140 mEq/L (ref 135–145)

## 2011-11-11 LAB — HEPATIC FUNCTION PANEL
AST: 23 U/L (ref 0–37)
Albumin: 4.2 g/dL (ref 3.5–5.2)
Alkaline Phosphatase: 70 U/L (ref 39–117)
Bilirubin, Direct: 0.1 mg/dL (ref 0.0–0.3)
Total Bilirubin: 0.8 mg/dL (ref 0.3–1.2)

## 2011-11-11 LAB — POCT URINALYSIS DIPSTICK
Bilirubin, UA: NEGATIVE
Blood, UA: NEGATIVE
Glucose, UA: NEGATIVE
Spec Grav, UA: 1.025
Urobilinogen, UA: 1

## 2011-11-11 LAB — LIPID PANEL
Cholesterol: 195 mg/dL (ref 0–200)
HDL: 48.5 mg/dL (ref >39.00)
LDL Cholesterol: 117 mg/dL — ABNORMAL HIGH (ref 0–99)
Triglycerides: 148 mg/dL (ref 0.0–149.0)

## 2011-11-18 ENCOUNTER — Ambulatory Visit (INDEPENDENT_AMBULATORY_CARE_PROVIDER_SITE_OTHER): Payer: 59 | Admitting: Internal Medicine

## 2011-11-18 ENCOUNTER — Encounter: Payer: Self-pay | Admitting: Internal Medicine

## 2011-11-18 VITALS — BP 130/90 | HR 64 | Temp 97.6°F | Ht 64.0 in | Wt 198.0 lb

## 2011-11-18 DIAGNOSIS — Z0000 Encounter for general adult medical examination without abnormal findings: Secondary | ICD-10-CM

## 2011-11-18 DIAGNOSIS — Z23 Encounter for immunization: Secondary | ICD-10-CM

## 2011-11-18 MED ORDER — HYDROCHLOROTHIAZIDE 25 MG PO TABS: 25.0000 mg | ORAL_TABLET | Freq: Every day | ORAL | Status: DC

## 2011-11-18 MED ORDER — AMLODIPINE BESYLATE 5 MG PO TABS: 5.0000 mg | ORAL_TABLET | Freq: Every day | ORAL | Status: DC

## 2011-11-18 MED ORDER — BENAZEPRIL HCL 20 MG PO TABS: 20.0000 mg | ORAL_TABLET | Freq: Every day | ORAL | Status: DC

## 2011-11-18 NOTE — Progress Notes (Signed)
Patient ID: Erica Sutton, female   DOB: 13-Jun-1956, 56 y.o.   MRN: [id number]  CPX  Past Medical History  Diagnosis Date  . Anemia   . Hypertension   . Eclampsia     History   Social History  . Marital Status: Married    Spouse Name: N/A    Number of Children: N/A  . Years of Education: N/A   Occupational History  . Not on file.   Social History Main Topics  . Smoking status: Never Smoker   . Smokeless tobacco: Not on file  . Alcohol Use:   . Drug Use:   . Sexually Active:    Other Topics Concern  . Not on file   Social History Narrative  . No narrative on file    Past Surgical History  Procedure Date  . Abdominal hysterectomy   . Shoulder shoulder     Family History  Problem Relation Age of Onset  . Diabetes Mother   . Liver disease Father     No Known Allergies  Current Outpatient Prescriptions on File Prior to Visit  Medication Sig Dispense Refill  . amLODipine (NORVASC) 5 MG tablet Take 5 mg by mouth daily.      . benazepril (LOTENSIN) 20 MG tablet Take 20 mg by mouth daily.      . hydrochlorothiazide (HYDRODIURIL) 25 MG tablet Take 25 mg by mouth daily.         patient denies chest pain, shortness of breath, orthopnea. Denies lower extremity edema, abdominal pain, change in appetite, change in bowel movements. Patient denies rashes, musculoskeletal complaints. No other specific complaints in a complete review of systems.   BP 130/90  Pulse 64  Temp(Src) 97.6 F (36.4 C) (Oral)  Ht 5\' 4"  (1.626 m)  Wt 198 lb (89.812 kg)  BMI 33.99 kg/m2  Well-developed well-nourished female in no acute distress. HEENT exam atraumatic, normocephalic, extraocular muscles are intact. Neck is supple. No jugular venous distention no thyromegaly. Chest clear to auscultation without increased work of breathing. Cardiac exam S1 and S2 are regular. Abdominal exam active bowel sounds, soft, nontender. Extremities no edema. Neurologic exam she is alert without any motor  sensory deficits. Gait is normal.   Well Visit: health maint UTD

## 2012-05-08 ENCOUNTER — Other Ambulatory Visit: Payer: Self-pay | Admitting: Internal Medicine

## 2012-05-08 DIAGNOSIS — Z1231 Encounter for screening mammogram for malignant neoplasm of breast: Secondary | ICD-10-CM

## 2012-06-09 ENCOUNTER — Ambulatory Visit
Admission: RE | Admit: 2012-06-09 | Discharge: 2012-06-09 | Disposition: A | Discharge status: Home / Self Care | Payer: 59 | Source: Ambulatory Visit | Attending: Internal Medicine | Admitting: Internal Medicine

## 2012-06-09 DIAGNOSIS — Z1231 Encounter for screening mammogram for malignant neoplasm of breast: Secondary | ICD-10-CM

## 2012-08-06 ENCOUNTER — Ambulatory Visit: Payer: 59 | Admitting: Obstetrics and Gynecology

## 2012-08-06 ENCOUNTER — Encounter: Payer: Self-pay | Admitting: Obstetrics and Gynecology

## 2012-08-06 VITALS — BP 140/98 | HR 72 | Ht 62.0 in | Wt 199.0 lb

## 2012-08-06 DIAGNOSIS — Z01419 Encounter for gynecological examination (general) (routine) without abnormal findings: Secondary | ICD-10-CM

## 2012-08-06 NOTE — Progress Notes (Signed)
Subjective:    Erica Sutton is a 57 y.o. female G4P4 who presents for annual exam. The patient complaints of hypertension. She is status post hysterectomy.  The following portions of the patient's history were reviewed and updated as appropriate: allergies, current medications, past family history, past medical history, past social history, past surgical history and problem list.  Review of Systems Pertinent items are noted in HPI. Gastrointestinal:No change in bowel habits, no abdominal pain, no rectal bleeding Genitourinary:negative for dysuria, frequency, hematuria, nocturia and urinary incontinence    Objective:     BP 140/98  Pulse 72  Ht 5\' 2"  (1.575 m)  Wt 199 lb (90.266 kg)  BMI 36.40 kg/m2  Weight:  Wt Readings from Last 1 Encounters:  08/06/12 199 lb (90.266 kg)     BMI: Body mass index is 36.40 kg/(m^2). General Appearance: Alert, appropriate appearance for age. No acute distress HEENT: Grossly normal Neck / Thyroid: Supple, no masses, nodes or enlargement Lungs: clear to auscultation bilaterally Back: No CVA tenderness Breast Exam: No masses or nodes.No dimpling, nipple retraction or discharge. Cardiovascular: Regular rate and rhythm. S1, S2, no murmur Gastrointestinal: Soft, non-tender, no masses or organomegaly  ++++++++++++++++++++++++++++++++++++++++++++++++++++++++  Pelvic Exam: External genitalia: normal general appearance Vaginal: normal without tenderness, induration or masses and relaxation noted Cervix: absent Adnexa: normal bimanual exam Uterus: absent Rectovaginal: normal rectal, no masses  ++++++++++++++++++++++++++++++++++++++++++++++++++++++++  Lymphatic Exam: Non-palpable nodes in neck, clavicular, axillary, or inguinal regions  Psychiatric: Alert and oriented, appropriate affect.      Assessment:    Normal gyn exam   Overweight or obese: Yes  Pelvic relaxation: Yes  Menopausal symptoms: No. Severe: No.  hypertension   Plan:    hypertension discussed   Follow-up:  for annual exam  The updated Pap smear screening guidelines were discussed with the patient. The patient requested that I obtain a Pap smear: No.  Kegel exercises discussed: Yes.  Proper diet and regular exercise were reviewed.  Annual mammograms recommended starting at age 54. Proper breast care was discussed.  Screening colonoscopy is recommended beginning at age 87.  Regular health maintenance was reviewed.  Sleep hygiene was discussed.  Adequate calcium and vitamin D intake was emphasized.  Leonard Schwartz M.D.   Regular Periods: no Mammogram: yes  Monthly Breast Ex.: yes Exercise: yes  Tetanus < 10 years: yes Seatbelts: yes  NI. Bladder Functn.: yes Abuse at home: no   Daily BM's: yes Stressful Work: yes  Healthy Diet: yes Sigmoid-Colonoscopy: 5 years ago  Calcium: no Medical problems this year: none   LAST PAP: 07/05/08  Contraception: hysterectomy   Mammogram:  06/2012  PCP: Dr. Cato Mulligan  PMH: no changes  FMH: no changes  Last Bone Scan: n/a

## 2012-10-02 ENCOUNTER — Ambulatory Visit (INDEPENDENT_AMBULATORY_CARE_PROVIDER_SITE_OTHER): Payer: 59 | Admitting: Family Medicine

## 2012-10-02 ENCOUNTER — Encounter: Payer: Self-pay | Admitting: Family Medicine

## 2012-10-02 VITALS — BP 102/76 | HR 71 | Temp 98.2°F | Wt 200.0 lb

## 2012-10-02 DIAGNOSIS — R109 Unspecified abdominal pain: Secondary | ICD-10-CM

## 2012-10-02 DIAGNOSIS — K5289 Other specified noninfective gastroenteritis and colitis: Secondary | ICD-10-CM

## 2012-10-02 DIAGNOSIS — K529 Noninfective gastroenteritis and colitis, unspecified: Secondary | ICD-10-CM

## 2012-10-02 NOTE — Progress Notes (Signed)
Chief Complaint  Patient presents with  . Abdominal Pain    on right side; sharpe pain,     HPI:  Acute visit for abdominal pain: -last night started having some R sided abd pain again and then stopped after a few hours - had several loose bowel movements today and yesterday - no hematochezia, melena, mucus in stools -Denies: fevers, chills, vomiting constipation, vaginal symptoms or urinary symptoms, vaginal bleeding, bloating - feels fine today -Postmenopausal, no vaginal bleeding -no hx abd surgeries, thinks had similar episode about 1 month ago    ROS: See pertinent positives and negatives per HPI.  Past Medical History  Diagnosis Date  . Anemia   . Hypertension   . Eclampsia     Family History  Problem Relation Age of Onset  . Diabetes Mother   . Liver disease Father   . Alcohol abuse Father     History   Social History  . Marital Status: Married    Spouse Name: N/A    Number of Children: N/A  . Years of Education: N/A   Social History Main Topics  . Smoking status: Never Smoker   . Smokeless tobacco: None  . Alcohol Use: 0.6 oz/week    1 Glasses of wine per week  . Drug Use: No  . Sexually Active: Yes    Birth Control/ Protection: Surgical     Comment: hysterectomy    Other Topics Concern  . None   Social History Narrative  . None    Current outpatient prescriptions:amLODipine (NORVASC) 5 MG tablet, Take 1 tablet (5 mg total) by mouth daily., Disp: 90 tablet, Rfl: 3;  benazepril (LOTENSIN) 20 MG tablet, Take 1 tablet (20 mg total) by mouth daily., Disp: 90 tablet, Rfl: 3;  hydrochlorothiazide (HYDRODIURIL) 25 MG tablet, Take 1 tablet (25 mg total) by mouth daily., Disp: 90 tablet, Rfl: 3 Multiple Vitamins-Minerals (MULTIVITAMIN WITH MINERALS) tablet, Take 1 tablet by mouth daily., Disp: , Rfl:   EXAM:  Filed Vitals:   10/02/12 1554  BP: 102/76  Pulse: 71  Temp: 98.2 F (36.8 C)    Body mass index is 36.57 kg/(m^2).  GENERAL: vitals reviewed  and listed above, alert, oriented, appears well hydrated and in no acute distress  HEENT: atraumatic, conjunttiva clear, no obvious abnormalities on inspection of external nose and ears  NECK: no obvious masses on inspection  LUNGS: clear to auscultation bilaterally, no wheezes, rales or rhonchi, Ganser air movement  CV: HRRR, no peripheral edema  ABD:BS+, soft, NTTP, no rebound or guarding with deep palpation  MS: moves all extremities without noticeable abnormality  PSYCH: pleasant and cooperative, no obvious depression or anxiety  ASSESSMENT AND PLAN:  Discussed the following assessment and plan:  Gastroenteritis  Abdominal pain, unspecified site  -57 yo f with brief episode of abd pain and several loose bowels now resolved with completely normal exam and no symptoms today - suspect gastroenteritis or gas discussed other etiologies for RLQ pain but all seem much less likely with brief nature and resolution of symptoms  -advised if recurs, persists to return -advised to schedule yearly physical -Patient advised to return or notify a doctor immediately if symptoms worsen or persist or new concerns arise.  There are no Patient Instructions on file for this visit.   Kriste Basque R.

## 2012-10-09 ENCOUNTER — Encounter: Payer: Self-pay | Admitting: Family Medicine

## 2012-10-09 ENCOUNTER — Ambulatory Visit: Payer: 59

## 2012-10-09 ENCOUNTER — Telehealth: Payer: Self-pay | Admitting: Internal Medicine

## 2012-10-09 ENCOUNTER — Ambulatory Visit (INDEPENDENT_AMBULATORY_CARE_PROVIDER_SITE_OTHER): Payer: 59 | Admitting: Family Medicine

## 2012-10-09 VITALS — BP 140/80 | Temp 98.3°F | Wt 201.0 lb

## 2012-10-09 DIAGNOSIS — R1011 Right upper quadrant pain: Secondary | ICD-10-CM

## 2012-10-09 LAB — HEPATIC FUNCTION PANEL
Alkaline Phosphatase: 68 U/L (ref 39–117)
Bilirubin, Direct: 0.1 mg/dL (ref 0.0–0.3)
Total Bilirubin: 0.8 mg/dL (ref 0.3–1.2)

## 2012-10-09 LAB — CBC WITH DIFFERENTIAL/PLATELET
Eosinophils Relative: 1 % (ref 0–5)
HCT: 39.9 % (ref 36.0–46.0)
Lymphocytes Relative: 45 % (ref 12–46)
Lymphs Abs: 2.8 10*3/uL (ref 0.7–4.0)
MCV: 91.9 fL (ref 78.0–100.0)
Monocytes Absolute: 0.4 10*3/uL (ref 0.1–1.0)
Monocytes Relative: 7 % (ref 3–12)
RBC: 4.34 MIL/uL (ref 3.87–5.11)
WBC: 6.3 10*3/uL (ref 4.0–10.5)

## 2012-10-09 NOTE — Telephone Encounter (Signed)
°  Patient Information:  Caller Name: [place]  Phone: [phone]  Patient: Erica Sutton  Gender: Female  DOB: Sep 22, 1955  Age: 57 Years  PCP: Birdie Sons (Adults only)  Pregnant: No  Office Follow Up:  Does the office need to follow up with this patient?: No  Instructions For The Office: N/A   Symptoms  Reason For Call & Symptoms: Pt was seen on Thursday 10/02/16  for an acute severe right sided abdominal pain she had had the night before. She had a similar episode 26month before that and again last night. She was not satisfied with the diagnosis at that time because she didn't feel that a "stomach bug" would last this long. . Pt states last night again she had severe right sided abdominal pain from 1-3am. Pt states the pain usually happens at night - it is severe for several hours and then it stops. Pt is pain free this am/.afeb.  Reviewed Health History In EMR: Yes  Reviewed Medications In EMR: Yes  Reviewed Allergies In EMR: Yes  Reviewed Surgeries / Procedures: Yes  Date of Onset of Symptoms: 10/09/2012 OB / GYN:  LMP: Unknown  Guideline(s) Used:  Abdominal Pain - Female  Disposition Per Guideline:   See Today in Office  Reason For Disposition Reached:   Patient wants to be seen  Advice Given:  N/A  Patient Will Follow Care Advice:  YES  Appointment Scheduled:  10/09/2012 14:45:00 Appointment Scheduled Provider:  Evelena Peat (Family Practice)

## 2012-10-09 NOTE — Progress Notes (Signed)
  Subjective:    Patient ID: Erica Sutton, female    DOB: Mar 05, 1956, 57 y.o.   MRN: [id number]  HPI  Abdominal pain off and on for one month Late night pain. RUQ 2-3 hours with 3 separate episodes. Moderate to severe pain.  Burning pain to stabbing pain. No pleuritic pain Mild nausea with no vomiting. No fever. Normal bowel movements. Radiates to shoulder blade area. Normal appetite and no weight loss. No clear exacerbating or alleviating factors.  Past Medical History  Diagnosis Date  . Anemia   . Hypertension   . Eclampsia    Past Surgical History  Procedure Laterality Date  . Abdominal hysterectomy    . Shoulder shoulder      reports that she has never smoked. She does not have any smokeless tobacco history on file. She reports that she drinks about 0.6 ounces of alcohol per week. She reports that she does not use illicit drugs. family history includes Alcohol abuse in her father; Diabetes in her mother; and Liver disease in her father. No Known Allergies    Review of Systems  Constitutional: Negative for fever, chills, appetite change and unexpected weight change.  Respiratory: Negative for cough and shortness of breath.   Cardiovascular: Negative for chest pain.  Gastrointestinal: Positive for abdominal pain. Negative for abdominal distention.  Endocrine: Negative for polyuria.  Genitourinary: Negative for dysuria.       Objective:   Physical Exam  Constitutional: She appears well-developed and well-nourished.  Cardiovascular: Normal rate and normal heart sounds.   Pulmonary/Chest: Effort normal and breath sounds normal. No respiratory distress. She has no wheezes. She has no rales.  Abdominal: Soft. Bowel sounds are normal. She exhibits no distension and no mass. There is tenderness. There is no rebound and no guarding.  Patient has tenderness right upper quadrant. No guarding or rebound. No hepatomegaly.  Skin: No rash noted.          Assessment &  Plan:  Intermittent right upper quadrant abdominal pain. Suspect symptomatic gallstones. She's not having fever or other indicators of acute infection. Check labs with CBC, lipase, hepatic panel. Set up abdominal ultrasound. Followup immediately for fever, recurrent vomiting, or worsening pain. Avoid high fat foods in the meantime.

## 2012-10-09 NOTE — Patient Instructions (Addendum)
Follow up immediately for any fever or worsening pain We will call you about ultrasound Avoid fatty foods until evaluated.

## 2012-10-12 NOTE — Progress Notes (Signed)
Quick Note:  All labs are back, pt informed on VM ______

## 2012-10-12 NOTE — Progress Notes (Signed)
Quick Note:  Pt informed on VM ______ 

## 2012-10-15 ENCOUNTER — Ambulatory Visit
Admission: RE | Admit: 2012-10-15 | Discharge: 2012-10-15 | Disposition: A | Discharge status: Home / Self Care | Payer: 59 | Source: Ambulatory Visit | Attending: Family Medicine | Admitting: Family Medicine

## 2012-10-15 DIAGNOSIS — R1011 Right upper quadrant pain: Secondary | ICD-10-CM

## 2012-10-19 NOTE — Progress Notes (Signed)
Quick Note:  Pt informed on personally identified VM, I requested a call back if she is interrested in referral ______

## 2012-10-20 ENCOUNTER — Telehealth: Payer: Self-pay | Admitting: *Deleted

## 2012-10-20 DIAGNOSIS — K8020 Calculus of gallbladder without cholecystitis without obstruction: Secondary | ICD-10-CM

## 2012-10-20 NOTE — Progress Notes (Signed)
Quick Note:  Yes, pt requesting referral ______

## 2012-10-20 NOTE — Telephone Encounter (Signed)
VM received from pt and yes, she is interested in a referall for gall stones found on [place]

## 2012-10-21 NOTE — Telephone Encounter (Signed)
Set up referral to [facility].

## 2012-10-28 ENCOUNTER — Encounter (INDEPENDENT_AMBULATORY_CARE_PROVIDER_SITE_OTHER): Payer: Self-pay | Admitting: General Surgery

## 2012-10-28 ENCOUNTER — Ambulatory Visit (INDEPENDENT_AMBULATORY_CARE_PROVIDER_SITE_OTHER): Payer: 59 | Admitting: General Surgery

## 2012-10-28 VITALS — BP 126/74 | HR 69 | Temp 98.6°F | Resp 12 | Ht 64.0 in | Wt 198.6 lb

## 2012-10-28 DIAGNOSIS — K8020 Calculus of gallbladder without cholecystitis without obstruction: Secondary | ICD-10-CM | POA: Insufficient documentation

## 2012-10-28 NOTE — Progress Notes (Signed)
Patient ID: Erica Sutton, female   DOB: 1955/09/17, 57 y.o.   MRN: [id number]  Chief Complaint  Patient presents with  . Abdominal Pain    gallstones    HPI Erica Sutton is a 57 y.o. female.  57 year old [place] American female referred by Dr. Caryl Never for evaluation of abdominal pain. The patient states she's had 3 episodes of where she developed right upper quadrant pain that radiates to her back. She describes it as a intense, sharp, and burning sensation. It would generally occur at night. The pain will generally last 2-3 hours. It is associated with nausea but no emesis. She denies any change in her bowel habits, weight loss, NSAID use. She denies any jaundice. She did not receive any relief with Mylanta. She denies any belching or burning sensation in the back of her throat her mouth. She has noticed some correlation with fatty and greasy foods.  Abdominal Pain Associated symptoms include nausea. Pertinent negatives include no arthralgias, constipation, diarrhea, fever, headaches, hematuria or vomiting.     Past Medical History  Diagnosis Date  . Anemia   . Hypertension   . Eclampsia     Past Surgical History  Procedure Laterality Date  . Abdominal hysterectomy    . Shoulder shoulder      Family History  Problem Relation Age of Onset  . Diabetes Mother   . Liver disease Father   . Alcohol abuse Father     Social History History  Substance Use Topics  . Smoking status: Never Smoker   . Smokeless tobacco: Not on file  . Alcohol Use: 0.6 oz/week    1 Glasses of wine per week    No Known Allergies  Current Outpatient Prescriptions  Medication Sig Dispense Refill  . amLODipine (NORVASC) 5 MG tablet Take 1 tablet (5 mg total) by mouth daily.  90 tablet  3  . benazepril (LOTENSIN) 20 MG tablet Take 1 tablet (20 mg total) by mouth daily.  90 tablet  3  . hydrochlorothiazide (HYDRODIURIL) 25 MG tablet Take 1 tablet (25 mg total) by mouth daily.  90 tablet  3  .  Multiple Vitamins-Minerals (MULTIVITAMIN WITH MINERALS) tablet Take 1 tablet by mouth daily.       No current facility-administered medications for this visit.    Review of Systems Review of Systems  Constitutional: Negative for fever, chills and unexpected weight change.  HENT: Negative for hearing loss, congestion, sore throat, trouble swallowing and voice change.   Eyes: Negative for visual disturbance.  Respiratory: Negative for cough and wheezing.   Cardiovascular: Negative for chest pain, palpitations and leg swelling.       Denies chest pain, shortness of breath, orthopnea, paroxysmal nocturnal dyspnea  Gastrointestinal: Positive for nausea and abdominal pain. Negative for vomiting, diarrhea, constipation, blood in stool, abdominal distention and anal bleeding.  Genitourinary: Negative for hematuria, vaginal bleeding and difficulty urinating.  Musculoskeletal: Negative for arthralgias.  Skin: Negative for rash and wound.  Neurological: Negative for seizures, syncope and headaches.       Denies amaurosis fugax and TIAs  Hematological: Negative for adenopathy. Does not bruise/bleed easily.  Psychiatric/Behavioral: Negative for confusion.    Blood pressure 126/74, pulse 69, temperature 98.6 F (37 C), temperature source Temporal, resp. rate 12, height 5\' 4"  (1.626 m), weight 198 lb 9.6 oz (90.084 kg).  Physical Exam Physical Exam  Vitals reviewed. Constitutional: She is oriented to person, place, and time. She appears well-developed and well-nourished. No distress.  HENT:  Head: Normocephalic and atraumatic.  Right Ear: External ear normal.  Left Ear: External ear normal.  Eyes: Conjunctivae are normal. No scleral icterus.  Neck: Normal range of motion. Neck supple. No tracheal deviation present.  Cardiovascular: Normal rate, regular rhythm and normal heart sounds.   Pulmonary/Chest: Effort normal and breath sounds normal. No respiratory distress.  Abdominal: Soft. She  exhibits no distension. There is no tenderness. There is no rebound and no guarding.    Musculoskeletal: She exhibits no edema and no tenderness.  Lymphadenopathy:    She has no cervical adenopathy.  Neurological: She is alert and oriented to person, place, and time.  Skin: Skin is warm and dry. No rash noted. She is not diaphoretic. No erythema.  Psychiatric: She has a normal mood and affect. Her behavior is normal. Judgment and thought content normal.    Data Reviewed Drs Erica Sutton and Erica Sutton's office notes  nml cbc, lfts, lipase  COMPLETE ABDOMINAL ULTRASOUND  Comparison: CT abdomen pelvis dated 07/09/2006  Findings:  Gallbladder: Multiple gallstones. No gallbladder wall thickening  or pericholecystic fluid. Negative sonographic Murphy's sign.  Common bile duct: Measures 6 mm.  Liver: Hyperechoic hepatic parenchyma, suggesting hepatic  steatosis. No focal hepatic lesion is seen.  IVC: Appears normal.  Pancreas: Poorly visualized due to overlying bowel gas.  Spleen: Measures 5.7 cm.  Right Kidney: Measures 11.2 cm. 1.7 x 2.1 x 1.6 cm lower pole  cyst. No hydronephrosis.  Left Kidney: Measures 12.7 cm. No mass or hydronephrosis  Abdominal aorta: No aneurysm identified.  IMPRESSION:  Cholelithiasis, without associated findings to suggest acute  cholecystitis.  Hepatic steatosis.  2.1 cm right lower pole cyst.   Assessment    Symptomatic cholelithiasis     Plan    I believe the patient's symptoms are consistent with gallbladder disease.  We discussed gallbladder disease. The patient was given Agricultural engineer. We discussed non-operative and operative management. We discussed the signs & symptoms of acute cholecystitis  I discussed laparoscopic cholecystectomy with IOC in detail.  The patient was given educational material as well as diagrams detailing the procedure.  We discussed the risks and benefits of a laparoscopic cholecystectomy including, but not limited to  bleeding, infection, injury to surrounding structures such as the intestine or liver, bile leak, retained gallstones, need to convert to an open procedure, prolonged diarrhea, blood clots such as  DVT, common bile duct injury, anesthesia risks, and possible need for additional procedures.  We discussed the typical post-operative recovery course. I explained that the likelihood of improvement of their symptoms is Papadopoulos.  The patient has elected to proceed with scheduling surgery. She will be scheduled for gallbladder surgery in the near future  Zayonna Ayuso M. Andrey Campanile, MD, FACS General, Bariatric, & Minimally Invasive Surgery [facility], [place]         [facility] M 10/28/2012, 1:18 PM

## 2012-10-28 NOTE — Patient Instructions (Signed)
Cholelithiasis Cholelithiasis (also called gallstones) is a form of gallbladder disease where gallstones form in your gallbladder. The gallbladder is a non-essential organ that stores bile made in the liver, which helps digest fats. Gallstones begin as small crystals and slowly grow into stones. Gallstone pain occurs when the gallbladder spasms, and a gallstone is blocking the duct. Pain can also occur when a stone passes out of the duct.  Women are more likely to develop gallstones than men. Other factors that increase the risk of gallbladder disease are:  Having multiple pregnancies. Physicians sometimes advise removing diseased gallbladders before future pregnancies.  Obesity.  Diets heavy in fried foods and fat.  Increasing age (older than 60).  Prolonged use of medications containing female hormones.  Diabetes mellitus.  Rapid weight loss.  Family history of gallstones (heredity). SYMPTOMS  Feeling sick to your stomach (nauseous).  Abdominal pain.  Yellowing of the skin (jaundice).  Sudden pain. It may persist from several minutes to several hours.  Worsening pain with deep breathing or when jarred.  Fever.  Tenderness to the touch. In some cases, when gallstones do not move into the bile duct, people have no pain or symptoms. These are called "silent" gallstones. TREATMENT In severe cases, emergency surgery may be required. HOME CARE INSTRUCTIONS   Only take over-the-counter or prescription medicines for pain, discomfort, or fever as directed by your caregiver.  Follow a low-fat diet until seen again. Fat causes the gallbladder to contract, which can result in pain.  Follow up as instructed. Attacks are almost always recurrent and surgery is usually required for permanent treatment. SEEK IMMEDIATE MEDICAL CARE IF:   Your pain increases and is not controlled by medications.  You have an oral temperature above 102 F (38.9 C), not controlled by medication.  You  develop nausea and vomiting. MAKE SURE YOU:   Understand these instructions.  Will watch your condition.  Will get help right away if you are not doing well or get worse. Document Released: 07/04/2005 Document Revised: 09/30/2011 Document Reviewed: 09/06/2010 Exit[employer] Patient Information 2013 [employer].  

## 2012-11-10 ENCOUNTER — Other Ambulatory Visit (INDEPENDENT_AMBULATORY_CARE_PROVIDER_SITE_OTHER): Payer: Self-pay | Admitting: General Surgery

## 2012-11-10 DIAGNOSIS — K8010 Calculus of gallbladder with chronic cholecystitis without obstruction: Secondary | ICD-10-CM

## 2012-11-10 DIAGNOSIS — K824 Cholesterolosis of gallbladder: Secondary | ICD-10-CM

## 2012-11-10 HISTORY — PX: CHOLECYSTECTOMY: SHX55

## 2012-11-24 ENCOUNTER — Other Ambulatory Visit: Payer: Self-pay | Admitting: Internal Medicine

## 2012-12-02 ENCOUNTER — Ambulatory Visit (INDEPENDENT_AMBULATORY_CARE_PROVIDER_SITE_OTHER): Payer: 59 | Admitting: General Surgery

## 2012-12-02 ENCOUNTER — Encounter (INDEPENDENT_AMBULATORY_CARE_PROVIDER_SITE_OTHER): Payer: Self-pay | Admitting: General Surgery

## 2012-12-02 VITALS — BP 124/90 | HR 78 | Temp 96.7°F | Ht 64.0 in | Wt 193.4 lb

## 2012-12-02 DIAGNOSIS — Z09 Encounter for follow-up examination after completed treatment for conditions other than malignant neoplasm: Secondary | ICD-10-CM

## 2012-12-02 NOTE — Progress Notes (Signed)
Subjective:     Patient ID: [name], female   DOB: 12/03/1955, 57 y.o.   MRN: [id number]  HPI 57 year old [place] American female comes in for followup after undergoing laparoscopic cholecystectomy with cholangiogram on April 22 of the surgical center at [facility]. She states that she has done well. She still has some soreness in her umbilicus. She denies any fevers, vomiting, nausea, diarrhea or constipation. She denies any difficulty with her incisions.  Review of Systems     Objective:   Physical Exam BP 124/90  Pulse 78  Temp(Src) 96.7 F (35.9 C) (Temporal)  Ht 5\' 4"  (1.626 m)  Wt 193 lb 6.4 oz (87.726 kg)  BMI 33.18 kg/m2  SpO2 98% Alert, no apparent distress, no scleral icterus Abdomen-soft, nontender, nondistended. Well-healed trocar incisions. At her subxiphoid trocar incision she has spit a stitch.. No hernia    Assessment:     Status post laparoscopic cholecystectomy for chronic cholecystitis and cholelithiasis     Plan:     We reviewed her pathology report and she was given a copy of it. She had evidence of chronic cholecystitis and cholelithiasis. I have released her to full activities. I did trim the suture that was sticking out of her skin. Followup as needed  Mary Sella. Andrey Campanile, MD, FACS General, Bariatric, & Minimally Invasive Surgery [facility], [place]

## 2012-12-09 ENCOUNTER — Other Ambulatory Visit: Payer: 59

## 2012-12-16 ENCOUNTER — Encounter: Payer: 59 | Admitting: Internal Medicine

## 2013-02-12 ENCOUNTER — Other Ambulatory Visit (INDEPENDENT_AMBULATORY_CARE_PROVIDER_SITE_OTHER): Payer: 59

## 2013-02-12 DIAGNOSIS — Z0000 Encounter for general adult medical examination without abnormal findings: Secondary | ICD-10-CM

## 2013-02-12 DIAGNOSIS — I10 Essential (primary) hypertension: Secondary | ICD-10-CM

## 2013-02-12 LAB — BASIC METABOLIC PANEL
GFR: 92.65 mL/min (ref >60.00)
Glucose, Bld: 117 mg/dL — ABNORMAL HIGH (ref 70–99)
Potassium: 4 mEq/L (ref 3.5–5.1)
Sodium: 138 mEq/L (ref 135–145)

## 2013-02-12 LAB — POCT URINALYSIS DIPSTICK
Glucose, UA: NEGATIVE
Nitrite, UA: NEGATIVE
Protein, UA: NEGATIVE
Urobilinogen, UA: 1

## 2013-02-12 LAB — HEPATIC FUNCTION PANEL
AST: 22 U/L (ref 0–37)
Albumin: 4.1 g/dL (ref 3.5–5.2)
Total Bilirubin: 0.8 mg/dL (ref 0.3–1.2)

## 2013-02-12 LAB — LIPID PANEL
HDL: 43.8 mg/dL (ref >39.00)
Total CHOL/HDL Ratio: 5
VLDL: 21.2 mg/dL (ref 0.0–40.0)

## 2013-02-12 LAB — CBC WITH DIFFERENTIAL/PLATELET
Basophils Absolute: 0 10*3/uL (ref 0.0–0.1)
Basophils Relative: 0.3 % (ref 0.0–3.0)
Eosinophils Relative: 2.1 % (ref 0.0–5.0)
HCT: 40.5 % (ref 36.0–46.0)
Hemoglobin: 13.6 g/dL (ref 12.0–15.0)
Lymphocytes Relative: 46.8 % — ABNORMAL HIGH (ref 12.0–46.0)
Lymphs Abs: 2 10*3/uL (ref 0.7–4.0)
Monocytes Relative: 8.8 % (ref 3.0–12.0)
Neutro Abs: 1.8 10*3/uL (ref 1.4–7.7)
RBC: 4.19 Mil/uL (ref 3.87–5.11)
RDW: 12.8 % (ref 11.5–14.6)

## 2013-02-12 LAB — TSH: TSH: 0.69 u[IU]/mL (ref 0.35–5.50)

## 2013-02-12 LAB — LDL CHOLESTEROL, DIRECT: Direct LDL: 134.3 mg/dL

## 2013-02-19 ENCOUNTER — Ambulatory Visit (INDEPENDENT_AMBULATORY_CARE_PROVIDER_SITE_OTHER): Payer: 59 | Admitting: Internal Medicine

## 2013-02-19 ENCOUNTER — Encounter: Payer: Self-pay | Admitting: Internal Medicine

## 2013-02-19 VITALS — BP 132/88 | HR 68 | Temp 97.9°F | Ht 63.0 in | Wt 194.0 lb

## 2013-02-19 DIAGNOSIS — R739 Hyperglycemia, unspecified: Secondary | ICD-10-CM

## 2013-02-19 DIAGNOSIS — E785 Hyperlipidemia, unspecified: Secondary | ICD-10-CM

## 2013-02-19 DIAGNOSIS — Z0000 Encounter for general adult medical examination without abnormal findings: Secondary | ICD-10-CM

## 2013-02-19 DIAGNOSIS — R7309 Other abnormal glucose: Secondary | ICD-10-CM

## 2013-02-19 HISTORY — DX: Hyperlipidemia, unspecified: E78.5

## 2013-02-19 HISTORY — DX: Hyperglycemia, unspecified: R73.9

## 2013-02-19 NOTE — Progress Notes (Signed)
cpx  Past Medical History  Diagnosis Date  . Anemia   . Hypertension   . Eclampsia     History   Social History  . Marital Status: Married    Spouse Name: N/A    Number of Children: N/A  . Years of Education: N/A   Occupational History  . Not on file.   Social History Main Topics  . Smoking status: Never Smoker   . Smokeless tobacco: Not on file  . Alcohol Use: 0.6 oz/week    1 Glasses of wine per week  . Drug Use: No  . Sexually Active: Yes    Birth Control/ Protection: Surgical     Comment: hysterectomy    Other Topics Concern  . Not on file   Social History Narrative  . No narrative on file    Past Surgical History  Procedure Laterality Date  . Abdominal hysterectomy    . Shoulder shoulder    . Cholecystectomy  11/10/12    lap chole with IOC    Family History  Problem Relation Age of Onset  . Diabetes Mother   . Liver disease Father   . Alcohol abuse Father     No Known Allergies  Current Outpatient Prescriptions on File Prior to Visit  Medication Sig Dispense Refill  . amLODipine (NORVASC) 5 MG tablet take 1 tablet by mouth once daily  90 tablet  0  . benazepril (LOTENSIN) 20 MG tablet take 1 tablet by mouth once daily  90 tablet  0  . hydrochlorothiazide (HYDRODIURIL) 25 MG tablet take 1 tablet by mouth daily  90 tablet  3  . Multiple Vitamins-Minerals (MULTIVITAMIN WITH MINERALS) tablet Take 1 tablet by mouth daily.       No current facility-administered medications on file prior to visit.     patient denies chest pain, shortness of breath, orthopnea. Denies lower extremity edema, abdominal pain, change in appetite, change in bowel movements. Patient denies rashes, musculoskeletal complaints. No other specific complaints in a complete review of systems.   BP 132/88  Pulse 68  Temp(Src) 97.9 F (36.6 C) (Oral)  Ht 5\' 3"  (1.6 m)  Wt 194 lb (87.998 kg)  BMI 34.37 kg/m2   Well-developed well-nourished female in no acute distress. HEENT exam  atraumatic, normocephalic, extraocular muscles are intact. Neck is supple. No jugular venous distention no thyromegaly. Chest clear to auscultation without increased work of breathing. Cardiac exam S1 and S2 are regular. Abdominal exam active bowel sounds, soft, nontender. Extremities no edema. Neurologic exam she is alert without any motor sensory deficits. Gait is normal.  Well visit- health maint utd  Weight loss to address blood sugar and lipids

## 2013-05-01 ENCOUNTER — Other Ambulatory Visit: Payer: Self-pay | Admitting: Internal Medicine

## 2013-05-19 ENCOUNTER — Other Ambulatory Visit: Payer: Self-pay

## 2013-05-19 DIAGNOSIS — Z1231 Encounter for screening mammogram for malignant neoplasm of breast: Secondary | ICD-10-CM

## 2013-05-24 ENCOUNTER — Other Ambulatory Visit (INDEPENDENT_AMBULATORY_CARE_PROVIDER_SITE_OTHER): Payer: 59

## 2013-05-24 DIAGNOSIS — R739 Hyperglycemia, unspecified: Secondary | ICD-10-CM

## 2013-05-24 DIAGNOSIS — R7309 Other abnormal glucose: Secondary | ICD-10-CM

## 2013-05-24 DIAGNOSIS — E785 Hyperlipidemia, unspecified: Secondary | ICD-10-CM

## 2013-05-24 LAB — LIPID PANEL
Cholesterol: 213 mg/dL — ABNORMAL HIGH (ref 0–200)
HDL: 50.3 mg/dL (ref >39.00)
Triglycerides: 118 mg/dL (ref 0.0–149.0)

## 2013-05-24 LAB — LDL CHOLESTEROL, DIRECT: Direct LDL: 149 mg/dL

## 2013-05-24 LAB — BASIC METABOLIC PANEL
CO2: 31 mEq/L (ref 19–32)
Calcium: 9.4 mg/dL (ref 8.4–10.5)
Creatinine, Ser: 1.1 mg/dL (ref 0.4–1.2)
GFR: 68.82 mL/min (ref >60.00)
Sodium: 139 mEq/L (ref 135–145)

## 2013-05-24 LAB — HEMOGLOBIN A1C: Hgb A1c MFr Bld: 5.4 % (ref 4.6–6.5)

## 2013-05-28 NOTE — Progress Notes (Signed)
Quick Note:  Left a message for return call. ______ 

## 2013-06-09 ENCOUNTER — Ambulatory Visit (INDEPENDENT_AMBULATORY_CARE_PROVIDER_SITE_OTHER): Payer: 59 | Admitting: Family Medicine

## 2013-06-09 ENCOUNTER — Encounter: Payer: Self-pay | Admitting: Family Medicine

## 2013-06-09 VITALS — BP 140/84 | HR 78 | Temp 98.0°F | Wt 197.0 lb

## 2013-06-09 DIAGNOSIS — H6992 Unspecified Eustachian tube disorder, left ear: Secondary | ICD-10-CM

## 2013-06-09 DIAGNOSIS — H6990 Unspecified Eustachian tube disorder, unspecified ear: Secondary | ICD-10-CM

## 2013-06-09 NOTE — Progress Notes (Signed)
  Subjective:    Patient ID: Erica Sutton, female    DOB: 22-Nov-1955, 57 y.o.   MRN: [id number]  HPI Here for 2 months of intermittent decreased hearing or "muffling" in the left ear. No pain. No sinus symptoms. No HA.    Review of Systems  Constitutional: Negative.   HENT: Positive for congestion and hearing loss. Negative for ear discharge, ear pain, postnasal drip, rhinorrhea and sinus pressure.   Eyes: Negative.   Respiratory: Negative.        Objective:   Physical Exam  Constitutional: She appears well-developed and well-nourished.  HENT:  Right Ear: External ear normal.  Left Ear: External ear normal.  Nose: Nose normal.  Mouth/Throat: Oropharynx is clear and moist.  Eyes: Conjunctivae are normal.  Neck: No thyromegaly present.  Pulmonary/Chest: Effort normal and breath sounds normal.  Lymphadenopathy:    She has no cervical adenopathy.          Assessment & Plan:  Try Claritin D bid prn

## 2013-06-09 NOTE — Progress Notes (Signed)
Pre visit review using our clinic review tool, if applicable. No additional management support is needed unless otherwise documented below in the visit note. 

## 2013-06-21 ENCOUNTER — Ambulatory Visit: Payer: 59

## 2013-07-12 ENCOUNTER — Ambulatory Visit
Admission: RE | Admit: 2013-07-12 | Discharge: 2013-07-12 | Disposition: A | Discharge status: Home / Self Care | Payer: 59 | Source: Ambulatory Visit

## 2013-07-12 DIAGNOSIS — Z1231 Encounter for screening mammogram for malignant neoplasm of breast: Secondary | ICD-10-CM

## 2013-07-27 ENCOUNTER — Emergency Department (HOSPITAL_COMMUNITY)
Admission: EM | Admit: 2013-07-27 | Discharge: 2013-07-28 | Disposition: A | Discharge status: Home / Self Care | Payer: 59 | Attending: Emergency Medicine | Admitting: Emergency Medicine

## 2013-07-27 ENCOUNTER — Encounter (HOSPITAL_COMMUNITY): Payer: Self-pay | Admitting: Emergency Medicine

## 2013-07-27 ENCOUNTER — Emergency Department (INDEPENDENT_AMBULATORY_CARE_PROVIDER_SITE_OTHER): Payer: 59

## 2013-07-27 ENCOUNTER — Emergency Department (HOSPITAL_COMMUNITY)
Admission: EM | Admit: 2013-07-27 | Discharge: 2013-07-27 | Disposition: A | Discharge status: Nursing Home (Includes ICF) | Payer: 59 | Source: Home / Self Care | Attending: Family Medicine | Admitting: Family Medicine

## 2013-07-27 DIAGNOSIS — R0789 Other chest pain: Secondary | ICD-10-CM

## 2013-07-27 DIAGNOSIS — R21 Rash and other nonspecific skin eruption: Secondary | ICD-10-CM | POA: Insufficient documentation

## 2013-07-27 DIAGNOSIS — Z791 Long term (current) use of non-steroidal anti-inflammatories (NSAID): Secondary | ICD-10-CM | POA: Insufficient documentation

## 2013-07-27 DIAGNOSIS — M549 Dorsalgia, unspecified: Secondary | ICD-10-CM | POA: Insufficient documentation

## 2013-07-27 DIAGNOSIS — Z79899 Other long term (current) drug therapy: Secondary | ICD-10-CM | POA: Insufficient documentation

## 2013-07-27 DIAGNOSIS — I10 Essential (primary) hypertension: Secondary | ICD-10-CM | POA: Insufficient documentation

## 2013-07-27 DIAGNOSIS — Z862 Personal history of diseases of the blood and blood-forming organs and certain disorders involving the immune mechanism: Secondary | ICD-10-CM | POA: Insufficient documentation

## 2013-07-27 LAB — COMPREHENSIVE METABOLIC PANEL
ALK PHOS: 72 U/L (ref 39–117)
ALT: 19 U/L (ref 0–35)
AST: 22 U/L (ref 0–37)
Albumin: 4.1 g/dL (ref 3.5–5.2)
BILIRUBIN TOTAL: 0.5 mg/dL (ref 0.3–1.2)
BUN: 12 mg/dL (ref 6–23)
CO2: 26 mEq/L (ref 19–32)
Calcium: 9.5 mg/dL (ref 8.4–10.5)
Chloride: 101 mEq/L (ref 96–112)
Creatinine, Ser: 0.85 mg/dL (ref 0.50–1.10)
GFR calc non Af Amer: 75 mL/min — ABNORMAL LOW (ref >90)
GFR, EST AFRICAN AMERICAN: 87 mL/min — AB (ref >90)
GLUCOSE: 100 mg/dL — AB (ref 70–99)
POTASSIUM: 4.1 meq/L (ref 3.7–5.3)
Sodium: 139 mEq/L (ref 137–147)
Total Protein: 7.8 g/dL (ref 6.0–8.3)

## 2013-07-27 LAB — CBC WITH DIFFERENTIAL/PLATELET
Basophils Absolute: 0 10*3/uL (ref 0.0–0.1)
Basophils Relative: 0 % (ref 0–1)
EOS ABS: 0.1 10*3/uL (ref 0.0–0.7)
Eosinophils Relative: 1 % (ref 0–5)
HEMATOCRIT: 41 % (ref 36.0–46.0)
HEMOGLOBIN: 14.1 g/dL (ref 12.0–15.0)
LYMPHS ABS: 2.5 10*3/uL (ref 0.7–4.0)
Lymphocytes Relative: 41 % (ref 12–46)
MCH: 32.6 pg (ref 26.0–34.0)
MCHC: 34.4 g/dL (ref 30.0–36.0)
MCV: 94.7 fL (ref 78.0–100.0)
Monocytes Absolute: 0.4 10*3/uL (ref 0.1–1.0)
Monocytes Relative: 7 % (ref 3–12)
NEUTROS PCT: 51 % (ref 43–77)
Neutro Abs: 3.1 10*3/uL (ref 1.7–7.7)
Platelets: 333 10*3/uL (ref 150–400)
RBC: 4.33 MIL/uL (ref 3.87–5.11)
RDW: 12.2 % (ref 11.5–15.5)
WBC: 6.1 10*3/uL (ref 4.0–10.5)

## 2013-07-27 LAB — POCT I-STAT TROPONIN I: Troponin i, poc: 0 ng/mL (ref 0.00–0.08)

## 2013-07-27 NOTE — ED Provider Notes (Signed)
CSN: [id number]     Arrival date & time 07/27/13  1831 History   First MD Initiated Contact with Patient 07/27/13 1846     Chief Complaint  Patient presents with  . Chest Pain   (Consider location/radiation/quality/duration/timing/severity/associated sxs/prior Treatment) Patient is a 58 y.o. female presenting with chest pain. The history is provided by the patient.  Chest Pain Pain location:  R chest Pain quality: sharp and tightness   Pain radiates to:  Upper back Pain radiates to the back: yes   Onset quality:  Sudden Duration:  2 hours Progression:  Waxing and waning Chronicity:  New Context comment:  Onset while driving home tonight . Relieved by:  None tried Worsened by:  Nothing tried Associated symptoms: no abdominal pain, no claudication, no cough, no fever, no lower extremity edema, no nausea, no palpitations, no shortness of breath and not vomiting     Past Medical History  Diagnosis Date  . Anemia   . Hypertension   . Eclampsia    Past Surgical History  Procedure Laterality Date  . Abdominal hysterectomy    . Shoulder shoulder    . Cholecystectomy  11/10/12    lap chole with IOC   Family History  Problem Relation Age of Onset  . Diabetes Mother   . Liver disease Father   . Alcohol abuse Father    History  Substance Use Topics  . Smoking status: Never Smoker   . Smokeless tobacco: Never Used  . Alcohol Use: 0.6 oz/week    1 Glasses of wine per week     Comment: occ   OB History   Grav Para Term Preterm Abortions TAB SAB Ect Mult Living   4 4        4      Review of Systems  Constitutional: Negative.  Negative for fever.  HENT: Negative.   Respiratory: Positive for chest tightness. Negative for cough, shortness of breath and wheezing.   Cardiovascular: Negative for chest pain, palpitations, claudication and leg swelling.  Gastrointestinal: Negative.  Negative for nausea, vomiting and abdominal pain.    Allergies  Review of patient's allergies  indicates no known allergies.  Home Medications   Current Outpatient Rx  Name  Route  Sig  Dispense  Refill  . amLODipine (NORVASC) 5 MG tablet      take 1 tablet by mouth once daily   90 tablet   0     NEEDS OV   . benazepril (LOTENSIN) 20 MG tablet      take 1 tablet by mouth once daily   90 tablet   0     NEEDS OV   . hydrochlorothiazide (HYDRODIURIL) 25 MG tablet      take 1 tablet by mouth daily   90 tablet   3     NEEDS OV   . Multiple Vitamins-Minerals (MULTIVITAMIN WITH MINERALS) tablet   Oral   Take 1 tablet by mouth daily.          BP 186/91  Pulse 67  Temp(Src) 98.1 F (36.7 C) (Oral)  Resp 16  SpO2 100% Physical Exam  Nursing note and vitals reviewed. Constitutional: She is oriented to person, place, and time. She appears well-developed and well-nourished.  Neck: Normal range of motion. Neck supple.  Cardiovascular: Normal rate, regular rhythm, normal heart sounds and intact distal pulses.   Pulmonary/Chest: Effort normal and breath sounds normal. No respiratory distress. She exhibits tenderness.  Abdominal: Soft. Bowel sounds are normal.  Lymphadenopathy:  She has no cervical adenopathy.  Neurological: She is alert and oriented to person, place, and time.  Skin: Skin is warm and dry.    ED Course  Procedures (including critical care time) Labs Review Labs Reviewed - No data to display Imaging Review Dg Chest 2 View  07/27/2013   CLINICAL DATA:  Right-sided chest pain.  EXAM: CHEST - 2 VIEW  COMPARISON:  None  FINDINGS: There is no evidence of pulmonary edema, consolidation, pneumothorax, nodule or pleural fluid. The heart size is normal. The aorta is mildly tortuous. The visualized skeletal structures are unremarkable.  IMPRESSION: Normal chest x-ray.   Electronically Signed   By: Aletta Edouard M.D.   On: 07/27/2013 19:31    EKG Interpretation    Date/Time:    Ventricular Rate:    PR Interval:    QRS Duration:   QT Interval:     QTC Calculation:   R Axis:     Text Interpretation:              MDM  ecg -wnl. X-rays reviewed and report per radiologist.  Sent for eval to r/o PE or other etiol of sudden onset of right sided chest pain., no apparent risk factors.    Billy Fischer, MD 07/27/13 [phone]

## 2013-07-27 NOTE — ED Notes (Signed)
C/o R ant. Chest pain that radiates to R shoulder and arm onset today with numbness in R hand and arm.  No pain with movement or palpation. No known injury. No pain with deep breath.  She was driving home from work and stopped at a friends house.  C/o SOB while driving but thinks she was getting anxious. No SOB now.  No nausea or sweating.  C/o burning and stinging in R ant shin.  It was swollen last night but not much today.  She thought it was a bug bite, but did not see or feel it bite her.

## 2013-07-27 NOTE — ED Notes (Addendum)
Pt from urgent care. Pt states CP that started today and radiates to her back and causes right arm numbness.

## 2013-07-28 ENCOUNTER — Telehealth: Payer: Self-pay | Admitting: Internal Medicine

## 2013-07-28 ENCOUNTER — Emergency Department (HOSPITAL_COMMUNITY): Payer: 59

## 2013-07-28 ENCOUNTER — Encounter (HOSPITAL_COMMUNITY): Payer: Self-pay | Admitting: Radiology

## 2013-07-28 LAB — TROPONIN I: Troponin I: <0.3 ng/mL (ref <0.30)

## 2013-07-28 LAB — D-DIMER, QUANTITATIVE: D-Dimer, Quant: 0.39 ug/mL-FEU (ref 0.00–0.48)

## 2013-07-28 MED ORDER — NAPROXEN 500 MG PO TABS: 500.0000 mg | ORAL_TABLET | Freq: Two times a day (BID) | ORAL | Status: DC

## 2013-07-28 MED ORDER — IOHEXOL 350 MG/ML SOLN
100.0000 mL | Freq: Once | INTRAVENOUS | Status: AC | PRN
  Administered 2013-07-28: 100 mL via INTRAVENOUS

## 2013-07-28 MED ORDER — DIPHENHYDRAMINE HCL 25 MG PO CAPS
25.0000 mg | ORAL_CAPSULE | Freq: Once | ORAL | Status: AC
  Administered 2013-07-28: 25 mg via ORAL
  Filled 2013-07-28: qty 1

## 2013-07-28 NOTE — Discharge Instructions (Signed)
Chest Pain (Nonspecific) There is no evidence of heart attack or blood clot in the lung. Followup with your doctor. Return to the ED if you develop new or worsening symptoms. It is often hard to give a specific diagnosis for the cause of chest pain. There is always a chance that your pain could be related to something serious, such as a heart attack or a blood clot in the lungs. You need to follow up with your caregiver for further evaluation. CAUSES   Heartburn.  Pneumonia or bronchitis.  Anxiety or stress.  Inflammation around your heart (pericarditis) or lung (pleuritis or pleurisy).  A blood clot in the lung.  A collapsed lung (pneumothorax). It can develop suddenly on its own (spontaneous pneumothorax) or from injury (trauma) to the chest.  Shingles infection (herpes zoster virus). The chest wall is composed of bones, muscles, and cartilage. Any of these can be the source of the pain.  The bones can be bruised by injury.  The muscles or cartilage can be strained by coughing or overwork.  The cartilage can be affected by inflammation and become sore (costochondritis). DIAGNOSIS  Lab tests or other studies, such as X-rays, electrocardiography, stress testing, or cardiac imaging, may be needed to find the cause of your pain.  TREATMENT   Treatment depends on what may be causing your chest pain. Treatment may include:  Acid blockers for heartburn.  Anti-inflammatory medicine.  Pain medicine for inflammatory conditions.  Antibiotics if an infection is present.  You may be advised to change lifestyle habits. This includes stopping smoking and avoiding alcohol, caffeine, and chocolate.  You may be advised to keep your head raised (elevated) when sleeping. This reduces the chance of acid going backward from your stomach into your esophagus.  Most of the time, nonspecific chest pain will improve within 2 to 3 days with rest and mild pain medicine. HOME CARE INSTRUCTIONS   If  antibiotics were prescribed, take your antibiotics as directed. Finish them even if you start to feel better.  For the next few days, avoid physical activities that bring on chest pain. Continue physical activities as directed.  Do not smoke.  Avoid drinking alcohol.  Only take over-the-counter or prescription medicine for pain, discomfort, or fever as directed by your caregiver.  Follow your caregiver's suggestions for further testing if your chest pain does not go away.  Keep any follow-up appointments you made. If you do not go to an appointment, you could develop lasting (chronic) problems with pain. If there is any problem keeping an appointment, you must call to reschedule. SEEK MEDICAL CARE IF:   You think you are having problems from the medicine you are taking. Read your medicine instructions carefully.  Your chest pain does not go away, even after treatment.  You develop a rash with blisters on your chest. SEEK IMMEDIATE MEDICAL CARE IF:   You have increased chest pain or pain that spreads to your arm, neck, jaw, back, or abdomen.  You develop shortness of breath, an increasing cough, or you are coughing up blood.  You have severe back or abdominal pain, feel nauseous, or vomit.  You develop severe weakness, fainting, or chills.  You have a fever. THIS IS AN EMERGENCY. Do not wait to see if the pain will go away. Get medical help at once. Call your local emergency services (911 in [place] Do not drive yourself to the hospital. MAKE SURE YOU:   Understand these instructions.  Will watch your condition.  Will get help right away if you are not doing well or get worse. Document Released: 04/17/2005 Document Revised: 09/30/2011 Document Reviewed: 02/11/2008 [facility] Patient Information 2014 [place].

## 2013-07-28 NOTE — ED Provider Notes (Signed)
CSN: [id number]     Arrival date & time 07/27/13  2052 History   First MD Initiated Contact with Patient 07/27/13 2336     Chief Complaint  Patient presents with  . Chest Pain   (Consider location/radiation/quality/duration/timing/severity/associated sxs/prior Treatment) HPI Comments: Around 5 PM, patient had sudden onset of right-sided chest pain that radiated to her upper arm and upper back. This lasted a few seconds while she was driving. The sharp pain has resolved but she still has a soreness in her chest it goes into her back and upper arm. She had some tingling in her arm which is now resolved. No weakness in the arm. No fever, cough, shortness of breath, nausea or vomiting. She was sent from urgent care to rule out PE. Denies any cardiac history. She did not take her blood pressure medication today. No similar episodes in the past. Never had a stress test. Denies any difficulty breathing or swallowing. No new medications or recent car trips.  The history is provided by the patient.    Past Medical History  Diagnosis Date  . Anemia   . Hypertension   . Eclampsia    Past Surgical History  Procedure Laterality Date  . Abdominal hysterectomy    . Shoulder shoulder    . Cholecystectomy  11/10/12    lap chole with IOC   Family History  Problem Relation Age of Onset  . Diabetes Mother   . Liver disease Father   . Alcohol abuse Father    History  Substance Use Topics  . Smoking status: Never Smoker   . Smokeless tobacco: Never Used  . Alcohol Use: 0.6 oz/week    1 Glasses of wine per week     Comment: occ   OB History   Grav Para Term Preterm Abortions TAB SAB Ect Mult Living   4 4        4      Review of Systems  Constitutional: Negative for fever, activity change and appetite change.  HENT: Negative for congestion and rhinorrhea.   Respiratory: Positive for chest tightness. Negative for cough and shortness of breath.   Cardiovascular: Positive for chest pain.   Gastrointestinal: Negative for nausea, vomiting and abdominal pain.  Genitourinary: Negative for dysuria and hematuria.  Musculoskeletal: Positive for back pain. Negative for arthralgias and myalgias.  Skin: Negative for rash.  Neurological: Negative for dizziness, weakness and headaches.  A complete 10 system review of systems was obtained and all systems are negative except as noted in the HPI and PMH.    Allergies  Review of patient's allergies indicates no known allergies.  Home Medications   Current Outpatient Rx  Name  Route  Sig  Dispense  Refill  . amLODipine (NORVASC) 5 MG tablet      take 1 tablet by mouth once daily   90 tablet   0     NEEDS OV   . benazepril (LOTENSIN) 20 MG tablet      take 1 tablet by mouth once daily   90 tablet   0     NEEDS OV   . hydrochlorothiazide (HYDRODIURIL) 25 MG tablet      take 1 tablet by mouth daily   90 tablet   3     NEEDS OV   . Multiple Vitamins-Minerals (MULTIVITAMIN WITH MINERALS) tablet   Oral   Take 1 tablet by mouth daily.         . naproxen (NAPROSYN) 500 MG tablet  Oral   Take 1 tablet (500 mg total) by mouth 2 (two) times daily.   30 tablet   0    BP 157/64  Pulse 71  Temp(Src) 97.5 F (36.4 C) (Oral)  Resp 16  Wt 201 lb 3 oz (91.258 kg)  SpO2 95% Physical Exam  Constitutional: She is oriented to person, place, and time. She appears well-developed and well-nourished. No distress.  HENT:  Head: Normocephalic and atraumatic.  Mouth/Throat: Oropharynx is clear and moist. No oropharyngeal exudate.  Eyes: Conjunctivae and EOM are normal. Pupils are equal, round, and reactive to light.  Neck: Normal range of motion. Neck supple.  Cardiovascular: Normal rate, regular rhythm and normal heart sounds.   No murmur heard. Pulmonary/Chest: Effort normal and breath sounds normal. No respiratory distress. She has no wheezes.  Abdominal: Soft. There is no tenderness. There is no rebound and no guarding.   Musculoskeletal: Normal range of motion. She exhibits no edema and no tenderness.  Equal grip strength bilaterally. Equal distal pulses  Neurological: She is alert and oriented to person, place, and time. No cranial nerve deficit. She exhibits normal muscle tone. Coordination normal.  CN 2-12 intact, no ataxia on finger to nose, no nystagmus, 5/5 strength throughout, no pronator drift, Romberg negative, normal gait.   Skin: Skin is warm. Rash noted.  Macular papular rash to bilateral cheeks, upper chest and upper back    ED Course  Procedures (including critical care time) Labs Review Labs Reviewed  COMPREHENSIVE METABOLIC PANEL - Abnormal; Notable for the following:    Glucose, Bld 100 (*)    GFR calc non Af Amer 75 (*)    GFR calc Af Amer 87 (*)    All other components within normal limits  CBC WITH DIFFERENTIAL  D-DIMER, QUANTITATIVE  TROPONIN I  POCT I-STAT TROPONIN I   Imaging Review Dg Chest 2 View  07/27/2013   CLINICAL DATA:  Right-sided chest pain.  EXAM: CHEST - 2 VIEW  COMPARISON:  None  FINDINGS: There is no evidence of pulmonary edema, consolidation, pneumothorax, nodule or pleural fluid. The heart size is normal. The aorta is mildly tortuous. The visualized skeletal structures are unremarkable.  IMPRESSION: Normal chest x-ray.   Electronically Signed   By: Aletta Edouard M.D.   On: 07/27/2013 19:31   Ct Angio Chest Aortic Dissect W &/or W/o  07/28/2013   CLINICAL DATA:  Chest and back pain.  EXAM: CT ANGIOGRAPHY CHEST, ABDOMEN AND PELVIS  TECHNIQUE: Multidetector CT imaging through the chest, abdomen and pelvis was performed using the standard protocol during bolus administration of intravenous contrast. Multiplanar reconstructed images including MIPs were obtained and reviewed to evaluate the vascular anatomy.  CONTRAST:  152mL OMNIPAQUE IOHEXOL 350 MG/ML SOLN  COMPARISON:  None.  FINDINGS: CTA CHEST FINDINGS  No significant findings at the thoracic inlet.  No cardiomegaly  or pericardial effusion. No aortic aneurysm or dissection. No evidence of pulmonary embolism, although not optimized for this purpose. No mediastinal lymphadenopathy. Unremarkable esophagus.  No consolidation, edema, effusion, or pneumothorax. Scarring in the medial right lower lobe secondary to vertebral osteophytes.  Review of the MIP images confirms the above findings.  CTA ABDOMEN AND PELVIS FINDINGS  No focal abnormality within the liver. Cholecystectomy. The pancreas, spleen, adrenal glands are unremarkable. No hydronephrosis or nephrolithiasis. There is symmetric renal enhancement. Bilateral low dense renal lesions, those large enough to characterize appears simple and cystic. The largest are on the right, measuring up to 1.5 cm from the  anterior interpolar kidney.  The bladder is unremarkable for distention. Hysterectomy. Symmetric and unremarkable appearing ovaries. No bowel obstruction. Few colonic diverticula. Normal appendix. No ascites or free air. No retroperitoneal lymph adenopathy.  No aortic aneurysm or dissection. Standard arterial anatomy. There is a tiny accessory right renal artery to the lower pole. No significant arterial stenosis.  Osteitis pubis.  Lower lumbar facet osteoarthritis.  Review of the MIP images confirms the above findings.  IMPRESSION: 1. Negative for aortic aneurysm or dissection. 2. No acute intrathoracic or intra-abdominal findings.   Electronically Signed   By: Jorje Guild M.D.   On: 07/28/2013 01:58   Ct Angio Abd/pel W/ And/or W/o  07/28/2013   CLINICAL DATA:  Chest and back pain.  EXAM: CT ANGIOGRAPHY CHEST, ABDOMEN AND PELVIS  TECHNIQUE: Multidetector CT imaging through the chest, abdomen and pelvis was performed using the standard protocol during bolus administration of intravenous contrast. Multiplanar reconstructed images including MIPs were obtained and reviewed to evaluate the vascular anatomy.  CONTRAST:  119mL OMNIPAQUE IOHEXOL 350 MG/ML SOLN  COMPARISON:   None.  FINDINGS: CTA CHEST FINDINGS  No significant findings at the thoracic inlet.  No cardiomegaly or pericardial effusion. No aortic aneurysm or dissection. No evidence of pulmonary embolism, although not optimized for this purpose. No mediastinal lymphadenopathy. Unremarkable esophagus.  No consolidation, edema, effusion, or pneumothorax. Scarring in the medial right lower lobe secondary to vertebral osteophytes.  Review of the MIP images confirms the above findings.  CTA ABDOMEN AND PELVIS FINDINGS  No focal abnormality within the liver. Cholecystectomy. The pancreas, spleen, adrenal glands are unremarkable. No hydronephrosis or nephrolithiasis. There is symmetric renal enhancement. Bilateral low dense renal lesions, those large enough to characterize appears simple and cystic. The largest are on the right, measuring up to 1.5 cm from the anterior interpolar kidney.  The bladder is unremarkable for distention. Hysterectomy. Symmetric and unremarkable appearing ovaries. No bowel obstruction. Few colonic diverticula. Normal appendix. No ascites or free air. No retroperitoneal lymph adenopathy.  No aortic aneurysm or dissection. Standard arterial anatomy. There is a tiny accessory right renal artery to the lower pole. No significant arterial stenosis.  Osteitis pubis.  Lower lumbar facet osteoarthritis.  Review of the MIP images confirms the above findings.  IMPRESSION: 1. Negative for aortic aneurysm or dissection. 2. No acute intrathoracic or intra-abdominal findings.   Electronically Signed   By: Jorje Guild M.D.   On: 07/28/2013 01:58    EKG Interpretation    Date/Time:  Tuesday July 27 2013 20:58:02 EST Ventricular Rate:  66 PR Interval:  142 QRS Duration: 72 QT Interval:  418 QTC Calculation: 438 R Axis:   51 Text Interpretation:  Normal sinus rhythm Normal ECG No significant change was found Confirmed by Wyvonnia Dusky  MD, Chester Sibert ([id number]) on 07/27/2013 11:35:46 PM            MDM   1.  Atypical chest pain   2. Hypertension    Sharp right-sided chest pain that radiated to upper back and arm. No shortness of breath, nausea or fever.  EKG normal sinus rhythm. 6 troponin is negative. D-dimer is negative. Patient is significantly hypertensive. She did not take her medications today. Upper extremity blood pressures are equal. No weakness or numbness in RUE currently.  Equal grip strengths and pulses.  No evidence of aortic dissection or pulmonary embolism. Patient's pain is likely musculoskeletal. There is no weakness, numbness or tingling in her right arm. She is equal grip strength and equal  pulses.   Ezequiel Essex, MD 07/28/13 [id number]

## 2013-07-28 NOTE — ED Notes (Signed)
Pt A&Ox4, ambulatory at discharge. 

## 2013-07-28 NOTE — Telephone Encounter (Signed)
Pt seen in ed for chest pain . They ruled out heart attack. But asked pt to fu w/ pcp. No appts except same day.  Is it ok to put two together to get pt in next week? pls advise?  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

## 2013-07-29 NOTE — Telephone Encounter (Signed)
Pt is aware waiting on md °

## 2013-07-30 NOTE — Telephone Encounter (Signed)
See if pt can come in next Friday at 9:30.  If not then see if she will follow up with [facility].

## 2013-08-06 NOTE — Telephone Encounter (Signed)
Pt had jury duty all week and will be there until mid week of the 20th. Offered feb2 sche

## 2013-08-23 ENCOUNTER — Ambulatory Visit (INDEPENDENT_AMBULATORY_CARE_PROVIDER_SITE_OTHER): Payer: 59 | Admitting: Internal Medicine

## 2013-08-23 ENCOUNTER — Encounter: Payer: Self-pay | Admitting: Internal Medicine

## 2013-08-23 VITALS — BP 144/90 | HR 72 | Temp 98.1°F | Ht 63.0 in | Wt 194.0 lb

## 2013-08-23 DIAGNOSIS — R079 Chest pain, unspecified: Secondary | ICD-10-CM

## 2013-08-23 MED ORDER — OMEPRAZOLE 20 MG PO CPDR: 20.0000 mg | DELAYED_RELEASE_CAPSULE | Freq: Every day | ORAL | Status: DC

## 2013-08-23 NOTE — Progress Notes (Signed)
Pre visit review using our clinic review tool, if applicable. No additional management support is needed unless otherwise documented below in the visit note. 

## 2013-08-23 NOTE — Progress Notes (Signed)
Reviewed ED note Had some right sided chest pains Pains are improved but discomfort still comes and goes She also can experience some discomfort to right leg  She has no specific sxs of GERD  Reviewed pmh, psh, sochx, meds  ROS- negative except above  BP 144/90  Pulse 72  Temp(Src) 98.1 F (36.7 C) (Oral)  Ht 5\' 3"  (1.6 m)  Wt 194 lb (87.998 kg)  BMI 34.37 kg/m2  Well-developed well-nourished female in no acute distress. HEENT exam atraumatic, normocephalic, extraocular muscles are intact. Neck is supple. No jugular venous distention no thyromegaly. Chest clear to auscultation without increased work of breathing. Cardiac exam S1 and S2 are regular. Abdominal exam active bowel sounds, soft, nontender. Extremities no edema. Neurologic exam she is alert without any motor sensory deficits. Gait is normal.  Chest pain- sill treat empirically for GERD Omeprazole- call if sxs persist

## 2013-11-14 ENCOUNTER — Other Ambulatory Visit: Payer: Self-pay | Admitting: Internal Medicine

## 2014-02-04 ENCOUNTER — Other Ambulatory Visit: Payer: Self-pay | Admitting: Internal Medicine

## 2014-02-06 IMAGING — US US ABDOMEN COMPLETE
1 series · 14 of 25 positions shown · non-contrast
Comparison: CT abdomen pelvis dated 07/09/2006

CLINICAL DATA: Right upper quadrant pain, nausea

COMPLETE ABDOMINAL ULTRASOUND

[Series 1: us abdomen complete · 0.39mm/px · 14 of 80 slices shown]
[im 1/80]
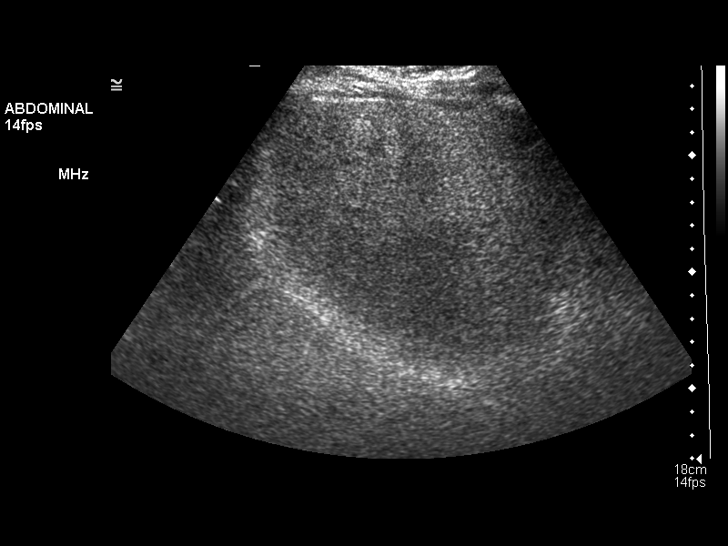
[im 7/80]
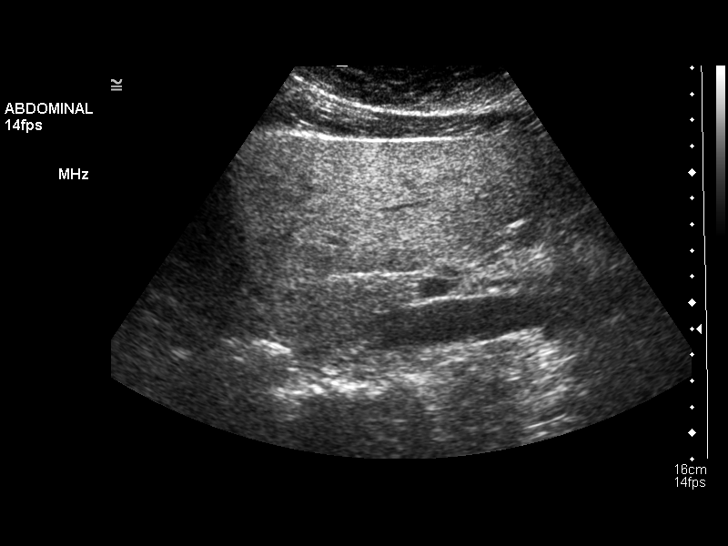
[im 14/80]
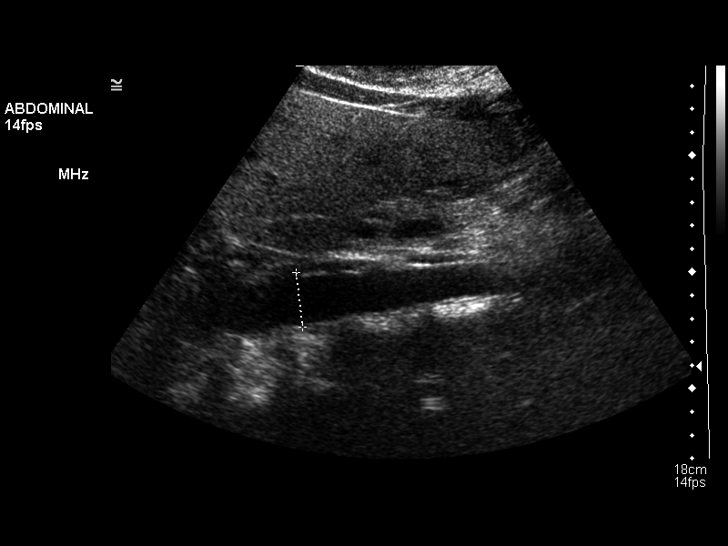
[im 20/80]
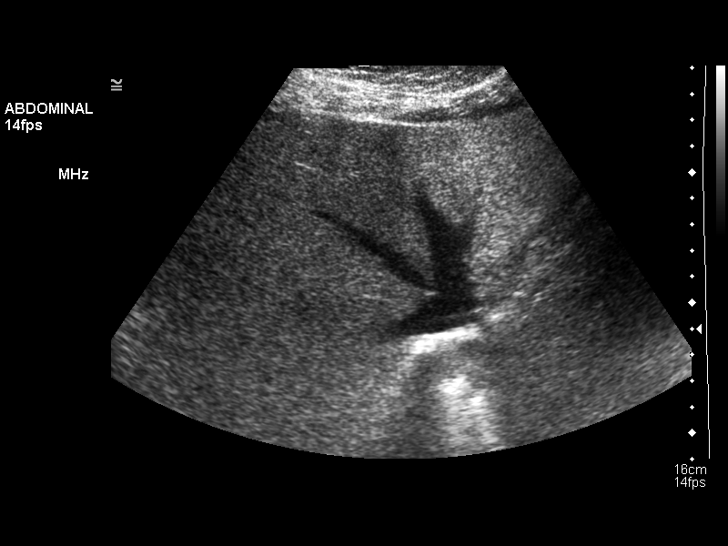
[im 27/80]
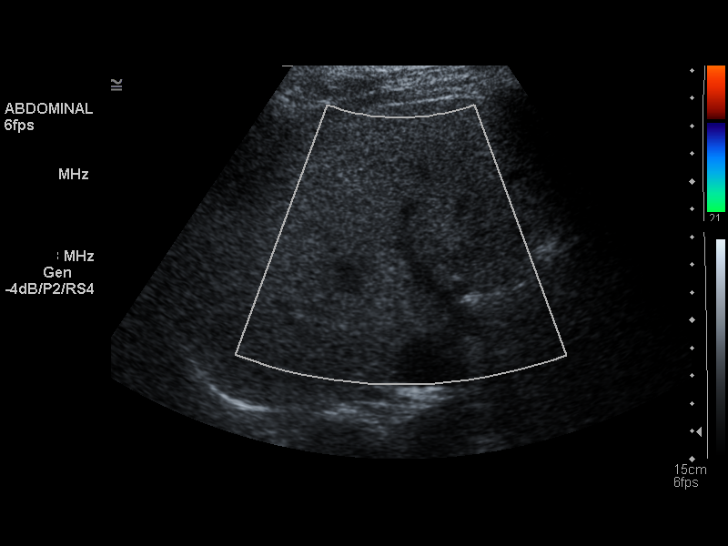
[im 30/80]
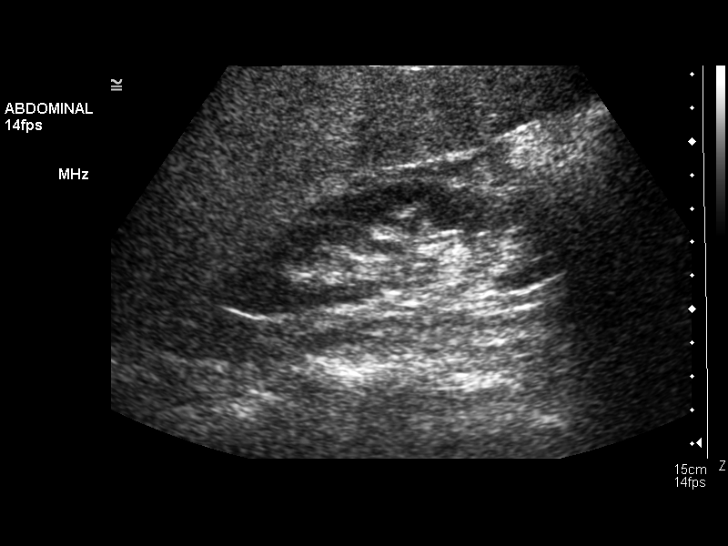
[im 37/80]
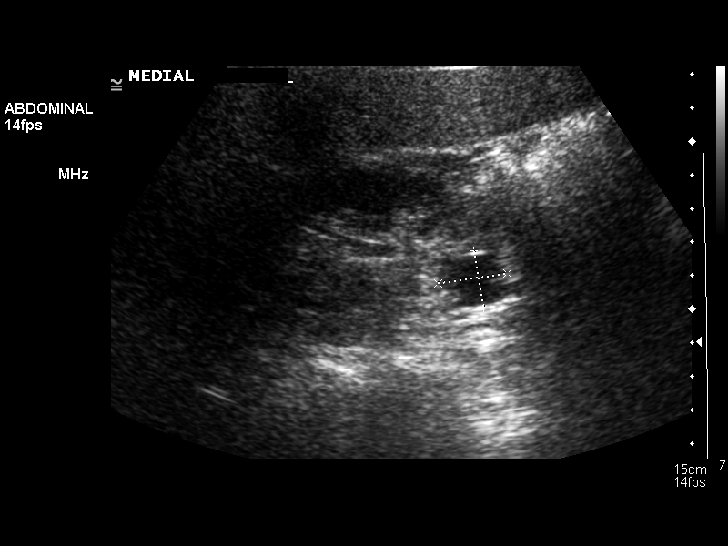
[im 43/80]
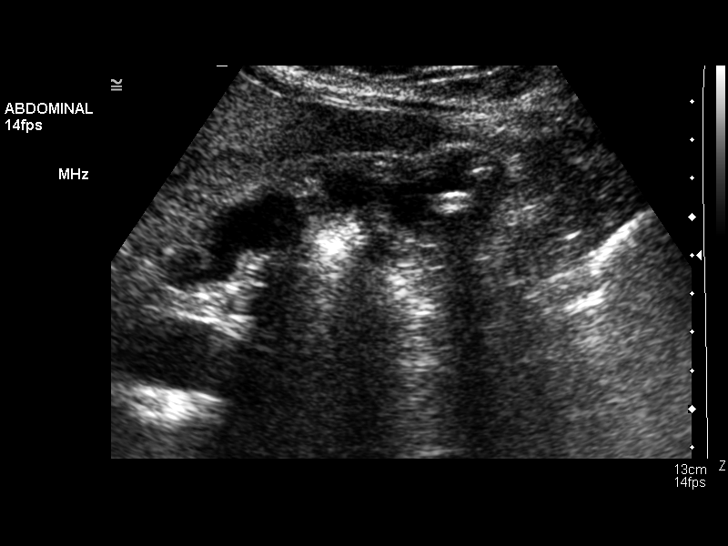
[im 50/80]
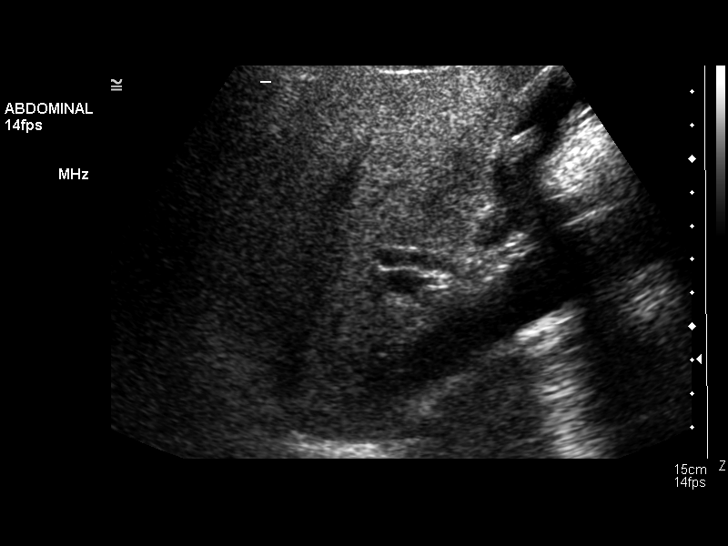
[im 53/80]
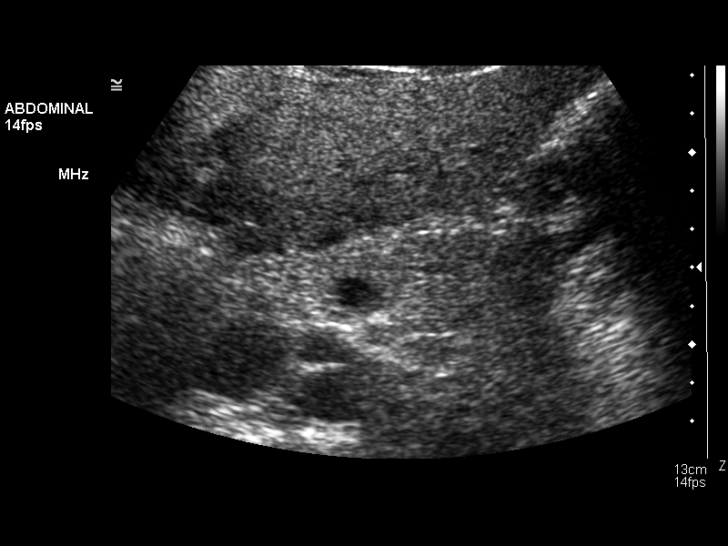
[im 60/80]
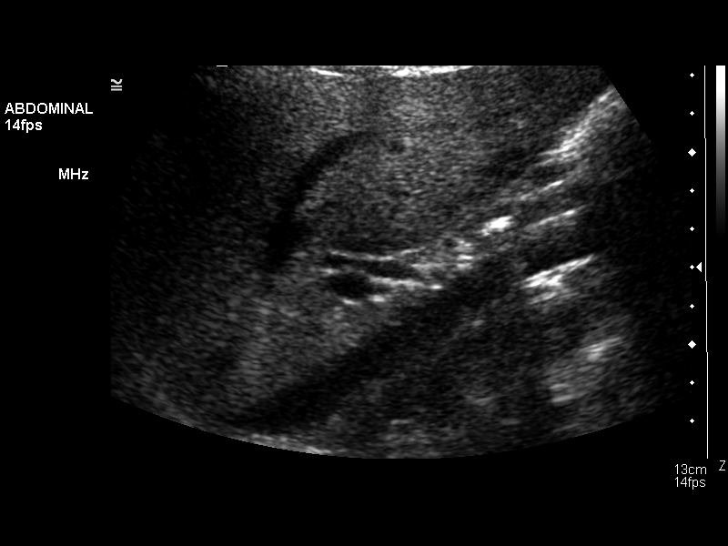
[im 66/80]
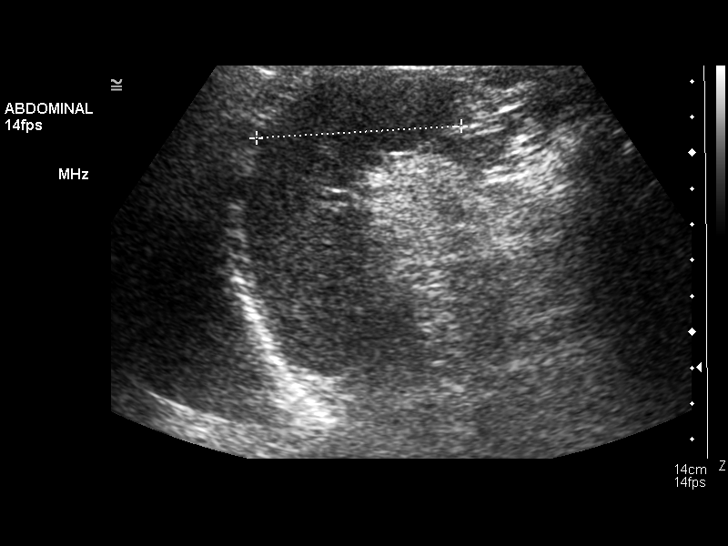
[im 73/80]
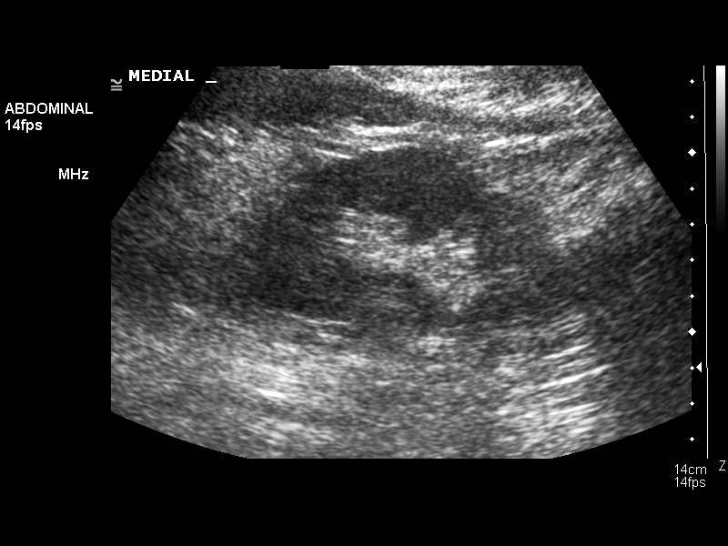
[im 80/80]
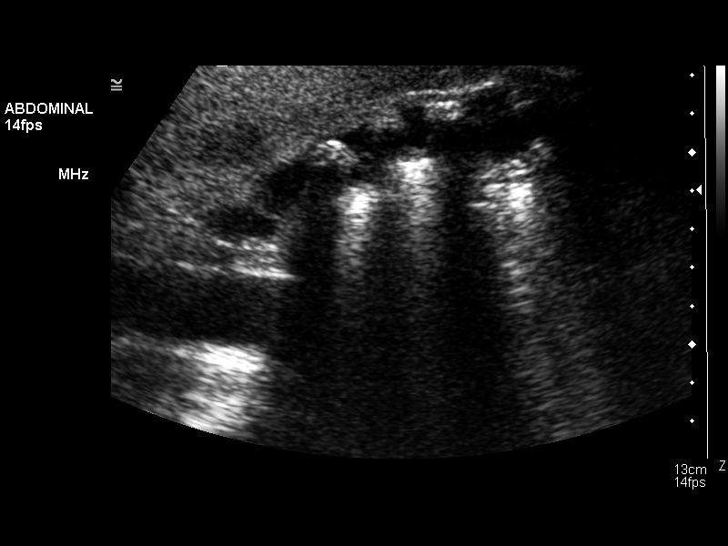

[14 of 25 positions shown; findings below may reference images not displayed]

FINDINGS: Gallbladder:  Multiple gallstones.  No gallbladder wall thickening
or pericholecystic fluid.  Negative sonographic Murphy's sign.

Common bile duct:  Measures 6 mm.

Liver:  Hyperechoic hepatic parenchyma, suggesting hepatic
steatosis.  No focal hepatic lesion is seen.

IVC:  Appears normal.

Pancreas:  Poorly visualized due to overlying bowel gas.

Spleen:  Measures 5.7 cm.

Right Kidney:  Measures 11.2 cm.  1.7 x 2.1 x 1.6 cm lower pole
cyst.  No hydronephrosis.

Left Kidney:  Measures 12.7 cm.  No mass or hydronephrosis

Abdominal aorta:  No aneurysm identified.
IMPRESSION: Cholelithiasis, without associated findings to suggest acute
cholecystitis.

Hepatic steatosis.

2.1 cm right lower pole cyst.

## 2014-05-23 ENCOUNTER — Encounter: Payer: Self-pay | Admitting: Internal Medicine

## 2014-05-25 ENCOUNTER — Other Ambulatory Visit: Payer: Self-pay | Admitting: Internal Medicine

## 2014-05-27 ENCOUNTER — Other Ambulatory Visit: Payer: Self-pay | Admitting: Internal Medicine

## 2014-06-24 ENCOUNTER — Other Ambulatory Visit: Payer: Self-pay

## 2014-06-24 DIAGNOSIS — Z1231 Encounter for screening mammogram for malignant neoplasm of breast: Secondary | ICD-10-CM

## 2014-07-13 ENCOUNTER — Ambulatory Visit
Admission: RE | Admit: 2014-07-13 | Discharge: 2014-07-13 | Disposition: A | Discharge status: Home / Self Care | Payer: 59 | Source: Ambulatory Visit

## 2014-07-13 DIAGNOSIS — Z1231 Encounter for screening mammogram for malignant neoplasm of breast: Secondary | ICD-10-CM

## 2014-11-11 ENCOUNTER — Other Ambulatory Visit: Payer: Self-pay | Admitting: Internal Medicine

## 2014-12-27 ENCOUNTER — Ambulatory Visit (INDEPENDENT_AMBULATORY_CARE_PROVIDER_SITE_OTHER): Payer: 59 | Admitting: Family Medicine

## 2014-12-27 ENCOUNTER — Encounter: Payer: Self-pay | Admitting: Family Medicine

## 2014-12-27 VITALS — BP 142/98 | HR 80 | Temp 97.7°F | Ht 63.0 in | Wt 197.2 lb

## 2014-12-27 DIAGNOSIS — R739 Hyperglycemia, unspecified: Secondary | ICD-10-CM | POA: Diagnosis not present

## 2014-12-27 DIAGNOSIS — Z6834 Body mass index (BMI) 34.0-34.9, adult: Secondary | ICD-10-CM | POA: Diagnosis not present

## 2014-12-27 DIAGNOSIS — Z7689 Persons encountering health services in other specified circumstances: Secondary | ICD-10-CM

## 2014-12-27 DIAGNOSIS — E785 Hyperlipidemia, unspecified: Secondary | ICD-10-CM | POA: Diagnosis not present

## 2014-12-27 DIAGNOSIS — I10 Essential (primary) hypertension: Secondary | ICD-10-CM

## 2014-12-27 DIAGNOSIS — Z7189 Other specified counseling: Secondary | ICD-10-CM

## 2014-12-27 LAB — BASIC METABOLIC PANEL
BUN: 15 mg/dL (ref 6–23)
CALCIUM: 9.9 mg/dL (ref 8.4–10.5)
CHLORIDE: 100 meq/L (ref 96–112)
CO2: 31 mEq/L (ref 19–32)
Creatinine, Ser: 0.89 mg/dL (ref 0.40–1.20)
GFR: 83.73 mL/min (ref >60.00)
GLUCOSE: 106 mg/dL — AB (ref 70–99)
Potassium: 4.4 mEq/L (ref 3.5–5.1)
SODIUM: 136 meq/L (ref 135–145)

## 2014-12-27 LAB — HEMOGLOBIN A1C: HEMOGLOBIN A1C: 5.5 % (ref 4.6–6.5)

## 2014-12-27 NOTE — Progress Notes (Signed)
Pre visit review using our clinic review tool, if applicable. No additional management support is needed unless otherwise documented below in the visit note. 

## 2014-12-27 NOTE — Patient Instructions (Signed)
BEFORE YOU LEAVE: -labs -schedule physical exam in 3 months - come fasting but drink plenty of water  Increase the benazapril to 1.5 tablets daily and monitor blood pressure. If running greater the 140/90 increase to 2 tablets once daily.  Take blood pressure medications every day in the morning.  We recommend the following healthy lifestyle measures: - eat a healthy diet consisting of lots of vegetables, fruits, beans, nuts, seeds, healthy meats such as white chicken and fish   - avoid fried foods, starches, sweets, fast food, processed foods, sodas, red meet and other fattening foods.  - get a least 150 minutes of aerobic exercise per week.

## 2014-12-27 NOTE — Progress Notes (Signed)
HPI:  Erica Sutton is here to establish care. Used to see Dr. Leanne Chang. Last PCP and physical: sees Dr. Trudee Grip for her gyn physicals  Has the following chronic problems that require follow up and concerns today:  HTN: -meds norvasc 5, benazapril 20, hctz 25 - takes at night, took her medications last night -denies: CP, SOB, swelling, HA, vision changes -reports BP is usually [place] at work  GERD: -hx of GERD and took PPI short term -denies any hx of dysphagia, hiatal hernia, esophagitis, stricture  Hx of mild anemia: -on and of her whole life -reports had remote eval for this and told just to take MV, resolved last check -denies: dizziness, syncope, hx blood transfusion, bleeding issues  HLD/Hyperglycemia: -mild -wellness program at work - CV exercise for 30-45 minutes 4 days per week -reports thinks could improve on watching starches in diet  ROS negative for unless reported above: fevers, unintentional weight loss, hearing or vision loss, chest pain, palpitations, struggling to breath, hemoptysis, melena, hematochezia, hematuria, falls, loc, si, thoughts of self harm  Past Medical History  Diagnosis Date  . Anemia   . Hypertension   . Eclampsia     Past Surgical History  Procedure Laterality Date  . Abdominal hysterectomy    . Shoulder shoulder    . Cholecystectomy  11/10/12    lap chole with IOC    Family History  Problem Relation Age of Onset  . Diabetes Mother   . Liver disease Father   . Alcohol abuse Father     History   Social History  . Marital Status: Married    Spouse Name: N/A  . Number of Children: N/A  . Years of Education: N/A   Social History Main Topics  . Smoking status: Former Research scientist (life sciences)  . Smokeless tobacco: Never Used     Comment: quit in her 55s, light smoker  . Alcohol Use: 0.6 oz/week    1 Glasses of wine per week     Comment: occ  . Drug Use: No  . Sexual Activity: Yes    Birth Control/ Protection: Surgical     Comment:  hysterectomy    Other Topics Concern  . None   Social History Narrative   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 Situation: lives with her husband and 30 yo son (baby of 4 children) (2016)      Spiritual Beliefs: Darrick Meigs, [employer]      Lifestyle: exercises 4 days per week; diet not great but working on this, needs to reduce starches and fats (2016)     Current outpatient prescriptions:  .  amLODipine (NORVASC) 5 MG tablet, take 1 tablet by mouth once daily, Disp: 90 tablet, Rfl: 0 .  benazepril (LOTENSIN) 20 MG tablet, take 1 tablet by mouth once daily, Disp: 90 tablet, Rfl: 0 .  hydrochlorothiazide (HYDRODIURIL) 25 MG tablet, take 1 tablet by mouth once daily, Disp: 90 tablet, Rfl: 3 .  Multiple Vitamins-Minerals (MULTIVITAMIN WITH MINERALS) tablet, Take 1 tablet by mouth daily., Disp: , Rfl:   EXAM:  Filed Vitals:   12/27/14 0926  BP: 142/98  Pulse: 80  Temp: 97.7 F (36.5 C)    Body mass index is 34.94 kg/(m^2).  GENERAL: vitals reviewed and listed above, alert, oriented, appears well hydrated and in no acute distress  HEENT: atraumatic, conjunttiva clear, no obvious abnormalities on inspection of external nose and ears  NECK: no obvious masses on  inspection  LUNGS: clear to auscultation bilaterally, no wheezes, rales or rhonchi, [place] air movement  CV: HRRR, no peripheral edema  MS: moves all extremities without noticeable abnormality  PSYCH: pleasant and cooperative, no obvious depression or anxiety  ASSESSMENT AND PLAN:  Discussed the following assessment and plan:  Essential hypertension - Plan: Basic metabolic panel -see pt instructions, increase benazapril, monitor, lifestyle changes  Hyperlipidemia - Plan: CANCELED: Lipid panel -do fasting at CPE, lifestyle recs  Hyperglycemia - Plan: Hemoglobin A1c  BMI 34.0-34.9,adult -lifestyle recs   Encounter to establish care -We reviewed the PMH, PSH, FH, SH, Meds and Allergies. -We  provided refills for any medications we will prescribe as needed. -We addressed current concerns per orders and patient instructions. -We have asked for records for pertinent exams, studies, vaccines and notes from previous providers. -We have advised patient to follow up per instructions below.   -Patient advised to return or notify a doctor immediately if symptoms worsen or persist or new concerns arise.  Patient Instructions  BEFORE YOU LEAVE: -labs -schedule physical exam in 3 months - come fasting but drink plenty of water  Increase the benazapril to 1.5 tablets daily and monitor blood pressure. If running greater the 140/90 increase to 2 tablets once daily.  Take blood pressure medications every day in the morning.  We recommend the following healthy lifestyle measures: - eat a healthy diet consisting of lots of vegetables, fruits, beans, nuts, seeds, healthy meats such as white chicken and fish   - avoid fried foods, starches, sweets, fast food, processed foods, sodas, red meet and other fattening foods.  - get a least 150 minutes of aerobic exercise per week.       Colin Benton R.

## 2015-03-16 ENCOUNTER — Other Ambulatory Visit: Payer: Self-pay | Admitting: Internal Medicine

## 2015-04-28 ENCOUNTER — Encounter: Payer: Self-pay | Admitting: Family Medicine

## 2015-04-28 ENCOUNTER — Ambulatory Visit (INDEPENDENT_AMBULATORY_CARE_PROVIDER_SITE_OTHER): Payer: 59 | Admitting: Family Medicine

## 2015-04-28 VITALS — BP 130/90 | HR 74 | Temp 97.8°F | Ht 62.0 in | Wt 197.3 lb

## 2015-04-28 DIAGNOSIS — I10 Essential (primary) hypertension: Secondary | ICD-10-CM

## 2015-04-28 DIAGNOSIS — Z0000 Encounter for general adult medical examination without abnormal findings: Secondary | ICD-10-CM

## 2015-04-28 DIAGNOSIS — E669 Obesity, unspecified: Secondary | ICD-10-CM

## 2015-04-28 DIAGNOSIS — E785 Hyperlipidemia, unspecified: Secondary | ICD-10-CM

## 2015-04-28 LAB — LIPID PANEL
CHOLESTEROL: 205 mg/dL — AB (ref 0–200)
HDL: 48.8 mg/dL (ref >39.00)
LDL Cholesterol: 131 mg/dL — ABNORMAL HIGH (ref 0–99)
NonHDL: 156.5
Total CHOL/HDL Ratio: 4
Triglycerides: 130 mg/dL (ref 0.0–149.0)
VLDL: 26 mg/dL (ref 0.0–40.0)

## 2015-04-28 NOTE — Progress Notes (Signed)
Pre visit review using our clinic review tool, if applicable. No additional management support is needed unless otherwise documented below in the visit note. 

## 2015-04-28 NOTE — Progress Notes (Signed)
HPI:  Erica Sutton is a pleasant 59 yo F here for a CPE:  -Concerns and/or follow up today:   HTN: -meds norvasc 5, benazapril 30 advised, but she is still taking 20mg , hctz 25  -denies: CP, SOB, swelling, HA, vision changes -reports BP is usually [place] at work  GERD: -hx of GERD and took PPI short term -denies any hx of dysphagia, hiatal hernia, esophagitis, stricture  Hx of mild anemia: -on and of her whole life -reports had remote eval for this and told just to take MV, resolved last check -denies: dizziness, syncope, hx blood transfusion, bleeding issues  HLD/Hyperglycemia/Obeisty: -wellness program at work - CV exercise for 30-45 minutes 4 days per week -reports thinks could improve on watching starches in diet -denies polyuria, polydipsia, malaise  -Diet: variety of foods, balance and well rounded -Exercise: regular exercise  -Diabetes and Dyslipidemia Screening: doing cholesterol screening today  -Vaccines: UTD  -pap history: UTD per her report - does with Dr. Raphael Gibney  -FDLMP: n/a  -sexual activity: yes, female partner, no new partners  -wants STI testing (Hep C if born [birth year]): no  -FH breast, colon or ovarian ca: see FH Last mammogram: UTD, does with gyn Last colon cancer screening: UTD per chart  Sees gyn for breast and women's health  -Alcohol, Tobacco, drug use: see social history  Review of Systems - no fevers, unintentional weight loss, vision loss, hearing loss, chest pain, sob, hemoptysis, melena, hematochezia, hematuria, genital discharge, changing or concerning skin lesions, bleeding, bruising, loc, thoughts of self harm or SI  Past Medical History  Diagnosis Date  . Anemia   . Hypertension   . Eclampsia     Past Surgical History  Procedure Laterality Date  . Abdominal hysterectomy    . Shoulder shoulder    . Cholecystectomy  11/10/12    lap chole with IOC    Family History  Problem Relation Age of Onset  . Diabetes Mother   .  Liver disease Father   . Alcohol abuse Father     Social History   Social History  . Marital Status: Married    Spouse Name: N/A  . Number of Children: N/A  . Years of Education: N/A   Social History Main Topics  . Smoking status: Former Research scientist (life sciences)  . Smokeless tobacco: Never Used     Comment: quit in her 78s, light smoker  . Alcohol Use: 0.6 oz/week    1 Glasses of wine per week     Comment: occ  . Drug Use: No  . Sexual Activity: Yes    Birth Control/ Protection: Surgical     Comment: hysterectomy    Other Topics Concern  . 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  amLODipine (NORVASC) 5 MG tablet, take 1 tablet by mouth once daily, Disp: 90 tablet, Rfl: 0 .  benazepril (LOTENSIN) 20 MG tablet, take 1 tablet by mouth once daily, Disp: 90 tablet, Rfl: 0 .  hydrochlorothiazide (HYDRODIURIL) 25 MG tablet, take 1 tablet by mouth once daily, Disp: 90 tablet, Rfl: 3 .  Multiple Vitamins-Minerals (MULTIVITAMIN WITH MINERALS) tablet, Take 1 tablet by mouth daily., Disp: , Rfl:   EXAM:  Filed Vitals:   04/28/15 0811  BP: 130/90  Pulse: 74  Temp: 97.8 F (36.6 C)   Body mass index is 36.08 kg/(m^2).  GENERAL: vitals reviewed and listed below, alert, oriented, appears well hydrated and in no acute distress  HEENT: head atraumatic, PERRLA, normal appearance of eyes, ears, nose and mouth. moist mucus membranes.  NECK: supple, no masses or lymphadenopathy  LUNGS: clear to auscultation bilaterally, no rales, rhonchi or wheeze  CV: HRRR, no peripheral edema or cyanosis, normal pedal pulses  BREAST: declined, does with gyn  ABDOMEN: bowel sounds  normal, soft, non tender to palpation, no masses, no rebound or guarding  GU: declined, does with gyn  SKIN: no rash or abnormal lesions  MS: normal gait, moves all extremities normally  NEURO: CN II-XII grossly intact, normal muscle strength and sensation to light touch on extremities  PSYCH: normal affect, pleasant and cooperative  ASSESSMENT AND PLAN:  Discussed the following assessment and plan:  Visit for preventive health examination  Essential hypertension  Hyperlipidemia - Plan: Lipid Panel  Obesity   -Discussed and advised all [place] preventive services health task force level A and B recommendations for age, sex and risks.  -Advised at least 150 minutes of exercise per week and a healthy diet low in saturated fats and sweets and consisting of fresh fruits and vegetables, lean meats such as fish and white chicken and whole grains.  -FASTING labs, studies and vaccines per orders this encounter  No orders of the defined types were placed in this encounter.    Patient advised to return to clinic immediately if symptoms worsen or persist or new concerns.  Patient Instructions  BEFORE YOU LEAVE: -labs -schedule follow up in 3-4 months  Increase Lotensin to 30mg  daily  -We have ordered labs or studies at this visit. It can take up to 1-2 weeks for results and processing. We will contact you with instructions IF your results are abnormal. Normal results will be released to your [facility]. If you have not heard from [place] or can not find your results in [facility] in 2 weeks please contact our office.  We recommend the following healthy lifestyle measures: - eat a healthy whole foods diet consisting of regular small meals composed of vegetables, fruits, beans, nuts, seeds, healthy meats such as white chicken and fish and whole grains.  - avoid sweets, white starchy foods, fried foods, fast food, processed foods, sodas, red meet and other fattening foods.  - get a least 150-300  minutes of aerobic exercise per week.      No Follow-up on file.  Colin Benton R.

## 2015-04-28 NOTE — Patient Instructions (Signed)
BEFORE YOU LEAVE: -labs -schedule follow up in 3-4 months  Increase Lotensin to 30mg  daily  -We have ordered labs or studies at this visit. It can take up to 1-2 weeks for results and processing. We will contact you with instructions IF your results are abnormal. Normal results will be released to your [facility]. If you have not heard from [place] or can not find your results in [facility] in 2 weeks please contact our office.  We recommend the following healthy lifestyle measures: - eat a healthy whole foods diet consisting of regular small meals composed of vegetables, fruits, beans, nuts, seeds, healthy meats such as white chicken and fish and whole grains.  - avoid sweets, white starchy foods, fried foods, fast food, processed foods, sodas, red meet and other fattening foods.  - get a least 150-300 minutes of aerobic exercise per week.

## 2015-05-08 ENCOUNTER — Other Ambulatory Visit: Payer: Self-pay | Admitting: Family Medicine

## 2015-07-07 ENCOUNTER — Other Ambulatory Visit: Payer: Self-pay | Admitting: Obstetrics and Gynecology

## 2015-07-07 ENCOUNTER — Other Ambulatory Visit: Payer: Self-pay

## 2015-07-07 DIAGNOSIS — Z1231 Encounter for screening mammogram for malignant neoplasm of breast: Secondary | ICD-10-CM

## 2015-07-19 ENCOUNTER — Ambulatory Visit
Admission: RE | Admit: 2015-07-19 | Discharge: 2015-07-19 | Disposition: A | Discharge status: Home / Self Care | Payer: 59 | Source: Ambulatory Visit

## 2015-07-19 DIAGNOSIS — Z1231 Encounter for screening mammogram for malignant neoplasm of breast: Secondary | ICD-10-CM

## 2015-08-03 ENCOUNTER — Encounter: Payer: Self-pay | Admitting: Family Medicine

## 2015-08-03 ENCOUNTER — Ambulatory Visit (INDEPENDENT_AMBULATORY_CARE_PROVIDER_SITE_OTHER): Payer: 59 | Admitting: Family Medicine

## 2015-08-03 VITALS — BP 124/88 | HR 76 | Temp 98.2°F | Ht 62.0 in | Wt 199.4 lb

## 2015-08-03 DIAGNOSIS — E785 Hyperlipidemia, unspecified: Secondary | ICD-10-CM | POA: Diagnosis not present

## 2015-08-03 DIAGNOSIS — I10 Essential (primary) hypertension: Secondary | ICD-10-CM | POA: Diagnosis not present

## 2015-08-03 DIAGNOSIS — E669 Obesity, unspecified: Secondary | ICD-10-CM | POA: Diagnosis not present

## 2015-08-03 DIAGNOSIS — K219 Gastro-esophageal reflux disease without esophagitis: Secondary | ICD-10-CM | POA: Insufficient documentation

## 2015-08-03 HISTORY — DX: Obesity, unspecified: E66.9

## 2015-08-03 HISTORY — DX: Gastro-esophageal reflux disease without esophagitis: K21.9

## 2015-08-03 NOTE — Patient Instructions (Signed)
BEFORE YOU LEAVE: -schedule follow up in 4 months, come fasting (no food) for 8 hours - but PLEASE drink plenty of WATER. You may also have black coffee.  We recommend the following healthy lifestyle measures: - eat a healthy whole foods diet consisting of regular small meals composed of vegetables, fruits, beans, nuts, seeds, healthy meats such as white chicken and fish and whole grains.  - avoid sweets, white starchy foods, fried foods, fast food, processed foods, sodas, red meet and other fattening foods.  - get a least 150-300 minutes of aerobic exercise per week.   Keep up the [place] work!

## 2015-08-03 NOTE — Progress Notes (Signed)
Pre visit review using our clinic review tool, if applicable. No additional management support is needed unless otherwise documented below in the visit note. 

## 2015-08-03 NOTE — Progress Notes (Signed)
HPI:  HTN: -meds norvasc 5, benazapril 30 advised at last visit, hctz 25  -denies: CP, SOB, swelling, HA, vision changes -reports BP is usually [place] at work  HLD/Hx Hyperglycemia/Obesity: -wellness program at work - gym, line dancing and walking on a regular basis for the last 1.5 weeks -also for a few weeks has started working on diet - cutting back on sweets and carbs -denies polyuria, polydipsia, malaise  GERD: -resolved  Hx of mild anemia: -on and of her whole life -reports had remote eval for this and told just to take MV, resolved last check -denies: dizziness, syncope, hx blood transfusion, bleeding issues   ROS: See pertinent positives and negatives per HPI.  Past Medical History  Diagnosis Date  . Anemia   . Hypertension   . Eclampsia   . Hyperlipidemia 02/19/2013  . Hyperglycemia 02/19/2013  . Obesity 08/03/2015  . GERD (gastroesophageal reflux disease) 08/03/2015    Past Surgical History  Procedure Laterality Date  . Abdominal hysterectomy    . Shoulder shoulder    . Cholecystectomy  11/10/12    lap chole with IOC    Family History  Problem Relation Age of Onset  . Diabetes Mother   . Liver disease Father   . Alcohol abuse Father     Social History   Social History  . Marital Status: Married    Spouse Name: N/A  . Number of Children: N/A  . Years of Education: N/A   Social History Main Topics  . Smoking status: Former Research scientist (life sciences)  . Smokeless tobacco: Never Used     Comment: quit in her 42s, light smoker  . Alcohol Use: 0.6 oz/week    1 Glasses of wine per week     Comment: occ  . Drug Use: No  . Sexual Activity: Yes    Birth Control/ Protection: Surgical     Comment: hysterectomy    Other Topics Concern  . 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  amLODipine (NORVASC) 5 MG tablet, take 1 tablet by mouth once daily, Disp: 90 tablet, Rfl: 0 .  benazepril (LOTENSIN) 20 MG tablet, take 1 tablet by mouth once daily, Disp: 90 tablet, Rfl: 0 .  hydrochlorothiazide (HYDRODIURIL) 25 MG tablet, take 1 tablet by mouth once daily, Disp: 90 tablet, Rfl: 3 .  Multiple Vitamins-Minerals (MULTIVITAMIN WITH MINERALS) tablet, Take 1 tablet by mouth daily., Disp: , Rfl:   EXAM:  Filed Vitals:   08/03/15 0825  BP: 124/88  Pulse: 76  Temp: 98.2 F (36.8 C)    Body mass index is 36.46 kg/(m^2).  GENERAL: vitals reviewed and listed above, alert, oriented, appears well hydrated and in no acute distress  HEENT: atraumatic, conjunttiva clear, no obvious abnormalities on inspection of external nose and ears  NECK: no obvious masses on inspection  LUNGS: clear to auscultation bilaterally, no wheezes, rales or rhonchi, [place] air movement  CV: HRRR, no peripheral edema  MS: moves all extremities without noticeable abnormality  PSYCH: pleasant and cooperative, no obvious depression or anxiety  ASSESSMENT AND PLAN:  Discussed  the following assessment and plan:  Essential hypertension  Hyperlipidemia  Obesity  -congratulated, encouraged and supported on lifestyle changes - advised lifelong changes -we opted to do labs at next visit after a longer amount of time on her new program -Patient advised to return or notify a doctor immediately if symptoms worsen or persist or new concerns arise.  Patient Instructions  BEFORE YOU LEAVE: -schedule follow up in 4 months, come fasting (no food) for 8 hours - but PLEASE drink plenty of WATER. You may also have black coffee.  We recommend the following healthy lifestyle measures: - eat a healthy whole foods diet consisting of regular small meals composed of  vegetables, fruits, beans, nuts, seeds, healthy meats such as white chicken and fish and whole grains.  - avoid sweets, white starchy foods, fried foods, fast food, processed foods, sodas, red meet and other fattening foods.  - get a least 150-300 minutes of aerobic exercise per week.   Keep up the [place] work!     Colin Benton R.

## 2015-09-24 ENCOUNTER — Other Ambulatory Visit: Payer: Self-pay | Admitting: Family Medicine

## 2015-10-12 ENCOUNTER — Other Ambulatory Visit: Payer: Self-pay | Admitting: Family Medicine

## 2015-12-01 ENCOUNTER — Ambulatory Visit (INDEPENDENT_AMBULATORY_CARE_PROVIDER_SITE_OTHER): Payer: 59 | Admitting: Family Medicine

## 2015-12-01 ENCOUNTER — Encounter: Payer: Self-pay | Admitting: Family Medicine

## 2015-12-01 VITALS — BP 132/88 | HR 83 | Temp 98.9°F | Ht 62.0 in | Wt 199.7 lb

## 2015-12-01 DIAGNOSIS — E785 Hyperlipidemia, unspecified: Secondary | ICD-10-CM

## 2015-12-01 DIAGNOSIS — R739 Hyperglycemia, unspecified: Secondary | ICD-10-CM | POA: Diagnosis not present

## 2015-12-01 DIAGNOSIS — L989 Disorder of the skin and subcutaneous tissue, unspecified: Secondary | ICD-10-CM

## 2015-12-01 DIAGNOSIS — E669 Obesity, unspecified: Secondary | ICD-10-CM

## 2015-12-01 DIAGNOSIS — I10 Essential (primary) hypertension: Secondary | ICD-10-CM | POA: Diagnosis not present

## 2015-12-01 NOTE — Progress Notes (Signed)
HPI:  HTN: -meds norvasc 5, benazapril 30, hctz 25  -denies: CP, SOB, swelling, HA, vision changes -had labs at biometric screen at work, plugged in values to [place] with 5.7 -some walking 3 days per week 20 minutes and gym 1 day per week -diet so so  HLD/Hx Hyperglycemia/Obesity: -lifestyle: see above -denies polyuria, polydipsia, malaise  Bump R medial heal -no pain, where shoe rubs -few months   ROS: See pertinent positives and negatives per HPI.  Past Medical History  Diagnosis Date  . Hypertension   . Hyperlipidemia 02/19/2013  . Hyperglycemia 02/19/2013  . Obesity 08/03/2015  . GERD (gastroesophageal reflux disease) 08/03/2015    resolved 2016  . Anemia     intermittent her whole life, reports she had eval and was told to take MV  . Eclampsia     Past Surgical History  Procedure Laterality Date  . Abdominal hysterectomy    . Shoulder shoulder    . Cholecystectomy  11/10/12    lap chole with IOC    Family History  Problem Relation Age of Onset  . Diabetes Mother   . Liver disease Father   . Alcohol abuse Father     Social History   Social History  . Marital Status: Married    Spouse Name: N/A  . Number of Children: N/A  . Years of Education: N/A   Social History Main Topics  . Smoking status: Former Research scientist (life sciences)  . Smokeless tobacco: Never Used     Comment: quit in her 71s, light smoker  . Alcohol Use: 0.6 oz/week    1 Glasses of wine per week     Comment: occ  . Drug Use: No  . Sexual Activity: Yes    Birth Control/ Protection: Surgical     Comment: hysterectomy    Other Topics Concern  . 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  amLODipine (NORVASC) 5 MG tablet, take 1 tablet by mouth once daily, Disp: 90 tablet, Rfl: 1 .  benazepril (LOTENSIN) 20 MG tablet, take 1 tablet by mouth once daily, Disp: 90 tablet, Rfl: 1 .  hydrochlorothiazide (HYDRODIURIL) 25 MG tablet, take 1 tablet by mouth once daily, Disp: 90 tablet, Rfl: 3 .  Multiple Vitamins-Minerals (MULTIVITAMIN WITH MINERALS) tablet, Take 1 tablet by mouth daily., Disp: , Rfl:   EXAM:  Filed Vitals:   12/01/15 0820  BP: 132/88  Pulse: 83  Temp: 98.9 F (37.2 C)    Body mass index is 36.52 kg/(m^2).  GENERAL: vitals reviewed and listed above, alert, oriented, appears well hydrated and in no acute distress  HEENT: atraumatic, conjunttiva clear, no obvious abnormalities on inspection of external nose and ears  NECK: no obvious masses on inspection  LUNGS: clear to auscultation bilaterally, no wheezes, rales or rhonchi, [place] air movement  CV: HRRR, no peripheral edema  MS: moves all extremities without noticeable abnormality  SKIN: small, soft subcut nodule medial R heal where edge of shoe rubs  PSYCH: pleasant and cooperative, no obvious depression or anxiety  ASSESSMENT AND PLAN:  Discussed the following  assessment and plan:  Hyperlipidemia -discussed implications and tx options including lifestyle changes (healthy whole foods diet and regular aerobic exercise) and consideration of a statin medication, risks and benefits -She opted to hold off on a medication at this time and wants to work aggressively on lifestyle changes -Follow up in 3-4 months  Obesity Hyperglycemia -Lifestyle changes and advised  Essential hypertension -Continue current treatment along with lifestyle changes  Bumps on skin -Likely from shoe rubbing -Trial of changing footwear, and advised to follow-up if persists or and /or new concerns  -Patient advised to return or notify a doctor immediately if symptoms worsen or persist or new concerns  arise.  Patient Instructions  BEFORE YOU LEAVE: -follow up appointment in 3-4 months  We recommend the following healthy lifestyle measures: - eat a healthy whole foods diet consisting of regular small meals composed of vegetables, fruits, beans, nuts, seeds, healthy meats such as white chicken and fish and whole grains.  - avoid sweets, white starchy foods, fried foods, fast food, processed foods, sodas, red meet and other fattening foods.  - get a least 150-300 minutes of aerobic exercise per week.   Where open back shoes to see if this resolves the bump on the foot     Orlin Kann R.

## 2015-12-01 NOTE — Patient Instructions (Signed)
BEFORE YOU LEAVE: -follow up appointment in 3-4 months  We recommend the following healthy lifestyle measures: - eat a healthy whole foods diet consisting of regular small meals composed of vegetables, fruits, beans, nuts, seeds, healthy meats such as white chicken and fish and whole grains.  - avoid sweets, white starchy foods, fried foods, fast food, processed foods, sodas, red meet and other fattening foods.  - get a least 150-300 minutes of aerobic exercise per week.   Where open back shoes to see if this resolves the bump on the foot

## 2015-12-01 NOTE — Progress Notes (Signed)
Pre visit review using our clinic review tool, if applicable. No additional management support is needed unless otherwise documented below in the visit note. 

## 2016-04-01 NOTE — Progress Notes (Signed)
HPI:  Follow up:  HTN: -meds norvasc 5, benazapril 30, hctz 25  -denies: CP, SOB, swelling, HA, vision changes  HLD/Hx Hyperglycemia/Obesity: -lifestyle: working with trainer - but scheduling not great; low back pain and seeing [place] [facility] for this; diet up and down - not great -had labs at biometric screen at work, plugged in values to [employer] with 5.7 - refused statin/medication 11/2015 -denies polyuria, polydipsia, malaise   ROS: See pertinent positives and negatives per HPI.  Past Medical History:  Diagnosis Date  . Anemia    intermittent her whole life, reports she had eval and was told to take MV  . Eclampsia   . GERD (gastroesophageal reflux disease) 08/03/2015   resolved 2016  . Hyperglycemia 02/19/2013  . Hyperlipidemia 02/19/2013  . Hypertension   . Obesity 08/03/2015    Past Surgical History:  Procedure Laterality Date  . ABDOMINAL HYSTERECTOMY    . CHOLECYSTECTOMY  11/10/12   lap chole with IOC  . shoulder shoulder      Family History  Problem Relation Age of Onset  . Diabetes Mother   . Liver disease Father   . Alcohol abuse Father     Social History   Social History  . Marital status: Married    Spouse name: N/A  . Number of children: N/A  . Years of education: N/A   Social History Main Topics  . Smoking status: Former Research scientist (life sciences)  . Smokeless tobacco: Never Used     Comment: quit in her 80s, light smoker  . Alcohol use 0.6 oz/week    1 Glasses of wine per week     Comment: occ  . Drug use: No  . Sexual activity: Yes    Birth control/ protection: Surgical     Comment: hysterectomy    Other Topics Concern  . 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  amLODipine (NORVASC) 5 MG tablet, take 1 tablet by  mouth once daily, Disp: 90 tablet, Rfl: 1 .  benazepril (LOTENSIN) 20 MG tablet, take 1 tablet by mouth once daily, Disp: 90 tablet, Rfl: 1 .  hydrochlorothiazide (HYDRODIURIL) 25 MG tablet, take 1 tablet by mouth once daily, Disp: 90 tablet, Rfl: 3 .  Multiple Vitamins-Minerals (MULTIVITAMIN WITH MINERALS) tablet, Take 1 tablet by mouth daily., Disp: , Rfl:   EXAM:  Vitals:   04/02/16 0926  BP: 128/82  Pulse: 80  Temp: 98 F (36.7 C)    Body mass index is 36.6 kg/m.  GENERAL: vitals reviewed and listed above, alert, oriented, appears well hydrated and in no acute distress  HEENT: atraumatic, conjunttiva clear, no obvious abnormalities on inspection of external nose and ears  NECK: no obvious masses on inspection  LUNGS: clear to auscultation bilaterally, no wheezes, rales or rhonchi, [place] air movement  CV: HRRR, no peripheral edema  MS: moves all extremities without noticeable abnormality  PSYCH: pleasant and cooperative, no obvious depression or anxiety  ASSESSMENT AND PLAN:  Discussed the following assessment and plan:  Hyperlipidemia - Plan: Lipid Panel  Essential hypertension - Plan: Basic metabolic panel, CBC (no diff)  Hyperglycemia  Obesity -flu shot -labs -lifestyle recs -discussed treatment options and risks  again for the treatment of hyperlipidemia and to lower CV risks; seems today she is agreeable to starting statin if needed after labs - though still a little reluctant -CPE in 3-4 months -Patient advised to return or notify a doctor immediately if symptoms worsen or persist or new concerns arise.  Patient Instructions  BEFORE YOU LEAVE: -flu shot -follow up: Physical exam in 3-4 months -labs today  We have ordered labs or studies at this visit. It can take up to 1-2 weeks for results and processing. IF results require follow up or explanation, we will call you with instructions. Clinically stable results will be released to your [facility]. If you  have not heard from [place] or cannot find your results in [facility] in 2 weeks please contact our office at [phone].  If you are not yet signed up for [facility], please consider signing up.   We recommend the following healthy lifestyle for LIFE: 1) Small portions.   Tip: eat off of a salad plate instead of a dinner plate.  Tip: It is ok to feel hungry after a meal - that likely means you ate an appropriate portion.  Tip: if you need more or a snack choose fruits, veggies and/or a handful of nuts or seeds.  2) Eat a healthy clean diet.  * Tip: Avoid (less then 1 serving per week): processed foods, sweets, sweetened drinks, white starches (rice, flour, bread, potatoes, pasta, etc), red meat, fast foods, butter  *Tip: CHOOSE instead   * 5-9 servings per day of fresh or frozen fruits and vegetables (but not corn, potatoes, bananas, canned or dried fruit)   *nuts and seeds, beans   *olives and olive oil   *small portions of lean meats such as fish and white chicken    *small portions of whole grains  3)Get at least 150 minutes of sweaty aerobic exercise per week.  4)Reduce stress - consider counseling, meditation and relaxation to balance other aspects of your life.         Colin Benton R., DO

## 2016-04-02 ENCOUNTER — Ambulatory Visit (INDEPENDENT_AMBULATORY_CARE_PROVIDER_SITE_OTHER): Payer: 59 | Admitting: Family Medicine

## 2016-04-02 ENCOUNTER — Encounter: Payer: Self-pay | Admitting: Family Medicine

## 2016-04-02 VITALS — BP 128/82 | HR 80 | Temp 98.0°F | Ht 62.0 in | Wt 200.1 lb

## 2016-04-02 DIAGNOSIS — I10 Essential (primary) hypertension: Secondary | ICD-10-CM

## 2016-04-02 DIAGNOSIS — E669 Obesity, unspecified: Secondary | ICD-10-CM

## 2016-04-02 DIAGNOSIS — E785 Hyperlipidemia, unspecified: Secondary | ICD-10-CM

## 2016-04-02 DIAGNOSIS — R739 Hyperglycemia, unspecified: Secondary | ICD-10-CM | POA: Diagnosis not present

## 2016-04-02 LAB — CBC
HEMATOCRIT: 40.6 % (ref 36.0–46.0)
HEMOGLOBIN: 13.9 g/dL (ref 12.0–15.0)
MCHC: 34.3 g/dL (ref 30.0–36.0)
MCV: 94.4 fl (ref 78.0–100.0)
PLATELETS: 324 10*3/uL (ref 150.0–400.0)
RBC: 4.3 Mil/uL (ref 3.87–5.11)
RDW: 12.9 % (ref 11.5–15.5)
WBC: 4.3 10*3/uL (ref 4.0–10.5)

## 2016-04-02 LAB — LIPID PANEL
CHOL/HDL RATIO: 5
Cholesterol: 219 mg/dL — ABNORMAL HIGH (ref 0–200)
HDL: 43.4 mg/dL (ref >39.00)
LDL Cholesterol: 142 mg/dL — ABNORMAL HIGH (ref 0–99)
NONHDL: 176.08
Triglycerides: 171 mg/dL — ABNORMAL HIGH (ref 0.0–149.0)
VLDL: 34.2 mg/dL (ref 0.0–40.0)

## 2016-04-02 LAB — BASIC METABOLIC PANEL
BUN: 13 mg/dL (ref 6–23)
CHLORIDE: 102 meq/L (ref 96–112)
CO2: 32 meq/L (ref 19–32)
Calcium: 9.5 mg/dL (ref 8.4–10.5)
Creatinine, Ser: 0.93 mg/dL (ref 0.40–1.20)
GFR: 79.25 mL/min (ref >60.00)
GLUCOSE: 140 mg/dL — AB (ref 70–99)
POTASSIUM: 3.8 meq/L (ref 3.5–5.1)
Sodium: 138 mEq/L (ref 135–145)

## 2016-04-02 NOTE — Patient Instructions (Signed)
BEFORE YOU LEAVE: -flu shot -follow up: Physical exam in 3-4 months -labs today  We have ordered labs or studies at this visit. It can take up to 1-2 weeks for results and processing. IF results require follow up or explanation, we will call you with instructions. Clinically stable results will be released to your [facility]. If you have not heard from [place] or cannot find your results in [facility] in 2 weeks please contact our office at [phone].  If you are not yet signed up for [facility], please consider signing up.   We recommend the following healthy lifestyle for LIFE: 1) Small portions.   Tip: eat off of a salad plate instead of a dinner plate.  Tip: It is ok to feel hungry after a meal - that likely means you ate an appropriate portion.  Tip: if you need more or a snack choose fruits, veggies and/or a handful of nuts or seeds.  2) Eat a healthy clean diet.  * Tip: Avoid (less then 1 serving per week): processed foods, sweets, sweetened drinks, white starches (rice, flour, bread, potatoes, pasta, etc), red meat, fast foods, butter  *Tip: CHOOSE instead   * 5-9 servings per day of fresh or frozen fruits and vegetables (but not corn, potatoes, bananas, canned or dried fruit)   *nuts and seeds, beans   *olives and olive oil   *small portions of lean meats such as fish and white chicken    *small portions of whole grains  3)Get at least 150 minutes of sweaty aerobic exercise per week.  4)Reduce stress - consider counseling, meditation and relaxation to balance other aspects of your life.

## 2016-04-02 NOTE — Progress Notes (Signed)
Pre visit review using our clinic review tool, if applicable. No additional management support is needed unless otherwise documented below in the visit note. 

## 2016-04-04 MED ORDER — ROSUVASTATIN CALCIUM 10 MG PO TABS: 10.0000 mg | ORAL_TABLET | Freq: Every day | ORAL | 1 refills | Status: DC

## 2016-04-04 NOTE — Addendum Note (Signed)
Addended by: Agnes Lawrence on: 04/04/2016 01:41 PM   Modules accepted: Orders

## 2016-04-08 ENCOUNTER — Other Ambulatory Visit: Payer: Self-pay | Admitting: Family Medicine

## 2016-05-03 ENCOUNTER — Other Ambulatory Visit: Payer: Self-pay | Admitting: Family Medicine

## 2016-05-30 ENCOUNTER — Ambulatory Visit (INDEPENDENT_AMBULATORY_CARE_PROVIDER_SITE_OTHER): Payer: 59 | Admitting: Family Medicine

## 2016-05-30 ENCOUNTER — Encounter: Payer: Self-pay | Admitting: Family Medicine

## 2016-05-30 VITALS — BP 136/90 | HR 67 | Temp 97.8°F | Ht 62.0 in | Wt 199.2 lb

## 2016-05-30 DIAGNOSIS — R300 Dysuria: Secondary | ICD-10-CM | POA: Diagnosis not present

## 2016-05-30 DIAGNOSIS — R42 Dizziness and giddiness: Secondary | ICD-10-CM

## 2016-05-30 DIAGNOSIS — I10 Essential (primary) hypertension: Secondary | ICD-10-CM | POA: Diagnosis not present

## 2016-05-30 LAB — POCT URINALYSIS DIPSTICK
BILIRUBIN UA: NEGATIVE
GLUCOSE UA: NEGATIVE
Ketones, UA: NEGATIVE
Nitrite, UA: NEGATIVE
SPEC GRAV UA: 1.02
UROBILINOGEN UA: 0.2
pH, UA: 7

## 2016-05-30 MED ORDER — NITROFURANTOIN MONOHYD MACRO 100 MG PO CAPS: 100.0000 mg | ORAL_CAPSULE | Freq: Two times a day (BID) | ORAL | 0 refills | Status: DC

## 2016-05-30 NOTE — Patient Instructions (Addendum)
BEFORE YOU LEAVE: -urine dip and reflex studies if needed -follow up: about 1-2 weeks, physical if possible as is due for physical  - take blood pressure medications at least 2 hours prior to appointment.  Flonase 2 sprays each nostril daily starting today.  Take blood pressure medications every day.  Follow up sooner if worsening or other concerns.

## 2016-05-30 NOTE — Progress Notes (Signed)
Pre visit review using our clinic review tool, if applicable. No additional management support is needed unless otherwise documented below in the visit note. 

## 2016-05-30 NOTE — Progress Notes (Signed)
HPI:  Acute visit for:  Dysuria:  -started today -symptoms include frequency, urgency and dysuria -Denies fevers, nausea, vomiting, abdominal or flank pain, vaginal symptoms or malaise  Vertigo: -Started about 2 weeks ago -Mild, brief spells of feeling lightheaded the last about a minute -Denies pain, shortness of breath, palpitations, headache, vision changes, weakness, numbness  -She does not feel that symptoms are associated with standing up or positional changes  HTN -Did not take blood pressure medications this morning -Usually does take her medications daily -No chest pain, shortness of breath, headache or vision changes  ROS: See pertinent positives and negatives per HPI.  Past Medical History:  Diagnosis Date  . Anemia    intermittent her whole life, reports she had eval and was told to take MV  . Eclampsia   . GERD (gastroesophageal reflux disease) 08/03/2015   resolved 2016  . Hyperglycemia 02/19/2013  . Hyperlipidemia 02/19/2013  . Hypertension   . Obesity 08/03/2015    Past Surgical History:  Procedure Laterality Date  . ABDOMINAL HYSTERECTOMY    . CHOLECYSTECTOMY  11/10/12   lap chole with IOC  . shoulder shoulder      Family History  Problem Relation Age of Onset  . Diabetes Mother   . Liver disease Father   . Alcohol abuse Father     Social History   Social History  . Marital status: Married    Spouse name: N/A  . Number of children: N/A  . Years of education: N/A   Social History Main Topics  . Smoking status: Former Research scientist (life sciences)  . Smokeless tobacco: Never Used     Comment: quit in her 9s, light smoker  . Alcohol use 0.6 oz/week    1 Glasses of wine per week     Comment: occ  . Drug use: No  . Sexual activity: Yes    Birth control/ protection: Surgical     Comment: hysterectomy    Other Topics Concern  . 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  amLODipine (NORVASC) 5 MG tablet, take 1 tablet by mouth once daily, Disp: 90 tablet, Rfl: 1 .  benazepril (LOTENSIN) 20 MG tablet, take 1 tablet by mouth once daily, Disp: 90 tablet, Rfl: 1 .  hydrochlorothiazide (HYDRODIURIL) 25 MG tablet, take 1 tablet by mouth once daily, Disp: 90 tablet, Rfl: 2 .  Multiple Vitamins-Minerals (MULTIVITAMIN WITH MINERALS) tablet, Take 1 tablet by mouth daily., Disp: , Rfl:  .  rosuvastatin (CRESTOR) 10 MG tablet, Take 1 tablet (10 mg total) by mouth daily., Disp: 90 tablet, Rfl: 1 .  nitrofurantoin, macrocrystal-monohydrate, (MACROBID) 100 MG capsule, Take 1 capsule (100 mg total) by mouth 2 (two) times daily., Disp: 14 capsule, Rfl: 0  EXAM:  Vitals:   05/30/16 1040  BP: 136/90  Pulse: 67  Temp: 97.8 F (36.6 C)    Body mass index is 36.43 kg/m.  GENERAL: vitals reviewed and listed above, alert, oriented, appears well hydrated and in no acute distress  HEENT: atraumatic, conjunttiva clear, no obvious abnormalities on inspection of external nose and ears, L ear clear effusion, o/w normal exam oropharynx, ears, nose  NECK: no obvious masses  on inspection, no bruit  LUNGS: clear to auscultation bilaterally, no wheezes, rales or rhonchi, [place] air movement  CV: HRRR, no peripheral edema  ABD: no CVA TTP  MS: moves all extremities without noticeable abnormality  PSYCH: pleasant and cooperative, no obvious depression or anxiety. CN II-XII grossly intact, finger to nose normal, dix hallpike neg, gait, speech and thought processing grossly intact  ASSESSMENT AND PLAN:  Discussed the following assessment and plan:  Dysuria - Plan: POC Urinalysis Dipstick, Culture, Urine -U dip possible UTI, discussed results with patient and she opted for empiric treatment with Macrobid, culture  pending -We will need further urine studies and infection  Vertigo -we discussed possible serious and likely etiologies, workup and treatment, treatment risks and return precautions  - left ear effusion, could be contributing -after this discussion, [place] opted for treatment of eustachian tube dysfunction and her blood pressure with close follow-up -of course, we advised [place]  to return or notify a doctor immediately if symptoms worsen or persist or new concerns arise.  Essential hypertension -Mildly elevated, did not take medicines today -Advised medication compliance and Close follow-up to recheck  -Patient advised to return or notify a doctor immediately if symptoms worsen or persist or new concerns arise.  Patient Instructions  BEFORE YOU LEAVE: -urine dip and reflex studies if needed -follow up: about 1-2 weeks, physical if possible as is due for physical  - take blood pressure medications at least 2 hours prior to appointment.  Flonase 2 sprays each nostril daily starting today.  Take blood pressure medications every day.  Follow up sooner if worsening or other concerns.    Colin Benton R., DO

## 2016-06-02 LAB — URINE CULTURE

## 2016-06-07 ENCOUNTER — Telehealth: Payer: Self-pay | Admitting: Family Medicine

## 2016-06-07 NOTE — Telephone Encounter (Signed)
Pt would like a call back concerning something Dr Maudie Mercury wanted her to get, and she has a question about it.

## 2016-06-07 NOTE — Telephone Encounter (Signed)
I called the pt and she stated she lost her AVS and questioned what Dr Maudie Mercury wanted her to get for the ear problem.  I read the instructions and advised she begin using Flonase and she agreed.

## 2016-06-10 ENCOUNTER — Ambulatory Visit (INDEPENDENT_AMBULATORY_CARE_PROVIDER_SITE_OTHER): Payer: 59 | Admitting: Family Medicine

## 2016-06-10 ENCOUNTER — Encounter: Payer: Self-pay | Admitting: Family Medicine

## 2016-06-10 VITALS — BP 130/84 | HR 77 | Temp 97.5°F | Ht 62.0 in | Wt 197.2 lb

## 2016-06-10 DIAGNOSIS — I10 Essential (primary) hypertension: Secondary | ICD-10-CM | POA: Diagnosis not present

## 2016-06-10 DIAGNOSIS — H6983 Other specified disorders of Eustachian tube, bilateral: Secondary | ICD-10-CM

## 2016-06-10 DIAGNOSIS — H6993 Unspecified Eustachian tube disorder, bilateral: Secondary | ICD-10-CM

## 2016-06-10 DIAGNOSIS — R300 Dysuria: Secondary | ICD-10-CM | POA: Diagnosis not present

## 2016-06-10 DIAGNOSIS — R42 Dizziness and giddiness: Secondary | ICD-10-CM

## 2016-06-10 LAB — POCT URINALYSIS DIPSTICK
Bilirubin, UA: NEGATIVE
Blood, UA: NEGATIVE
GLUCOSE UA: NEGATIVE
KETONES UA: NEGATIVE
Leukocytes, UA: NEGATIVE
Nitrite, UA: NEGATIVE
Protein, UA: NEGATIVE
Urobilinogen, UA: 0.2
pH, UA: 5

## 2016-06-10 NOTE — Progress Notes (Signed)
Pre visit review using our clinic review tool, if applicable. No additional management support is needed unless otherwise documented below in the visit note. 

## 2016-06-10 NOTE — Addendum Note (Signed)
Addended by: Agnes Lawrence on: 06/10/2016 02:36 PM   Modules accepted: Orders

## 2016-06-10 NOTE — Progress Notes (Signed)
HPI: Follow up dysuria, lightheaded and HTN from visit 05/30/16. Treated the urinary tract infection and also advised regular use of her blood pressure medicines and treatment of her eustachian tube dysfunction last visit. She reports she is doing much better, but still has some residual dysuria. She reports mild persistent urgency and mild discomfort when she urinates.  Denies fevers, malaise, nausea, vomiting, any persistent abdominal or pelvic pain, hematuria, remaining lightheaded sensation or dizziness.  ROS: See pertinent positives and negatives per HPI.  Past Medical History:  Diagnosis Date  . Anemia    intermittent her whole life, reports she had eval and was told to take MV  . Eclampsia   . GERD (gastroesophageal reflux disease) 08/03/2015   resolved 2016  . Hyperglycemia 02/19/2013  . Hyperlipidemia 02/19/2013  . Hypertension   . Obesity 08/03/2015    Past Surgical History:  Procedure Laterality Date  . ABDOMINAL HYSTERECTOMY    . CHOLECYSTECTOMY  11/10/12   lap chole with IOC  . shoulder shoulder      Family History  Problem Relation Age of Onset  . Diabetes Mother   . Liver disease Father   . Alcohol abuse Father     Social History   Social History  . Marital status: Married    Spouse name: N/A  . Number of children: N/A  . Years of education: N/A   Social History Main Topics  . Smoking status: Former Research scientist (life sciences)  . Smokeless tobacco: Never Used     Comment: quit in her 10s, light smoker  . Alcohol use 0.6 oz/week    1 Glasses of wine per week     Comment: occ  . Drug use: No  . Sexual activity: Yes    Birth control/ protection: Surgical     Comment: hysterectomy    Other Topics Concern  . 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  amLODipine (NORVASC) 5 MG tablet, take 1 tablet by mouth once daily, Disp: 90 tablet, Rfl: 1 .  benazepril (LOTENSIN) 20 MG tablet, take 1 tablet by mouth once daily, Disp: 90 tablet, Rfl: 1 .  hydrochlorothiazide (HYDRODIURIL) 25 MG tablet, take 1 tablet by mouth once daily, Disp: 90 tablet, Rfl: 2 .  Multiple Vitamins-Minerals (MULTIVITAMIN WITH MINERALS) tablet, Take 1 tablet by mouth daily., Disp: , Rfl:  .  nitrofurantoin, macrocrystal-monohydrate, (MACROBID) 100 MG capsule, Take 1 capsule (100 mg total) by mouth 2 (two) times daily., Disp: 14 capsule, Rfl: 0 .  rosuvastatin (CRESTOR) 10 MG tablet, Take 1 tablet (10 mg total) by mouth daily., Disp: 90 tablet, Rfl: 1  EXAM:  Vitals:   06/10/16 1355  BP: 130/84  Pulse: 77  Temp: 97.5 F (36.4 C)    Body mass index is 36.07 kg/m.  GENERAL: vitals reviewed and listed above, alert, oriented, appears well hydrated and in no acute distress  HEENT: atraumatic, conjunttiva clear, no obvious abnormalities on inspection of external nose and ears, normal inspection of the ear canals and TMs.  NECK: no obvious masses on inspection  LUNGS: clear to auscultation bilaterally, no wheezes, rales or rhonchi, [place] air movement  CV: HRRR, no peripheral edema  ABD:  BS+, soft, NTTP, no CVA TTP  MS: moves all extremities without noticeable abnormality  PSYCH: pleasant and cooperative, no obvious depression or anxiety  ASSESSMENT AND PLAN:  Discussed the following assessment and plan:  Essential hypertension  Dysfunction of both eustachian tubes  Dizziness and giddiness  Dysuria  -repeat urine studies to ensure resolution of infection - if persistent symptoms despite resolution discussed urology eval -dizziness resolved, advised daily adherence to BP meds and continue flonase for 2 more weeks -Patient advised to return or notify a doctor immediately if symptoms worsen or persist or new concerns  arise.  Patient Instructions  BEFORE YOU LEAVE: -urine dip and culture -follow up: 3 months and as needed in interim if any symptoms persist or new concerns arise  We have ordered labs or studies at this visit. It can take up to 1-2 weeks for results and processing. IF results require follow up or explanation, we will call you with instructions. Clinically stable results will be released to your [facility]. If you have not heard from [place] or cannot find your results in [facility] in 2 weeks please contact our office at [phone].  If you are not yet signed up for [facility], please consider signing up.           Colin Benton R., DO

## 2016-06-10 NOTE — Patient Instructions (Signed)
BEFORE YOU LEAVE: -urine dip and culture -follow up: 3 months and as needed in interim if any symptoms persist or new concerns arise  We have ordered labs or studies at this visit. It can take up to 1-2 weeks for results and processing. IF results require follow up or explanation, we will call you with instructions. Clinically stable results will be released to your [facility] If you have not heard from [place] or cannot find your results in [facility] in 2 weeks please contact our office at [phone].  If you are not yet signed up for [facility], please consider signing up.

## 2016-06-11 LAB — URINE CULTURE: Organism ID, Bacteria: NO GROWTH

## 2016-06-21 ENCOUNTER — Other Ambulatory Visit: Payer: Self-pay | Admitting: Family Medicine

## 2016-06-24 ENCOUNTER — Telehealth: Payer: Self-pay | Admitting: Family Medicine

## 2016-06-24 NOTE — Telephone Encounter (Signed)
Pt is returning joann call °

## 2016-06-24 NOTE — Telephone Encounter (Signed)
See results note. 

## 2016-07-09 ENCOUNTER — Other Ambulatory Visit: Payer: Self-pay | Admitting: Obstetrics and Gynecology

## 2016-07-09 DIAGNOSIS — Z1231 Encounter for screening mammogram for malignant neoplasm of breast: Secondary | ICD-10-CM

## 2016-07-30 ENCOUNTER — Ambulatory Visit
Admission: RE | Admit: 2016-07-30 | Discharge: 2016-07-30 | Disposition: A | Discharge status: Home / Self Care | Payer: 59 | Source: Ambulatory Visit | Attending: Obstetrics and Gynecology | Admitting: Obstetrics and Gynecology

## 2016-07-30 DIAGNOSIS — Z1231 Encounter for screening mammogram for malignant neoplasm of breast: Secondary | ICD-10-CM

## 2016-09-10 ENCOUNTER — Ambulatory Visit (INDEPENDENT_AMBULATORY_CARE_PROVIDER_SITE_OTHER): Payer: 59 | Admitting: Family Medicine

## 2016-09-10 ENCOUNTER — Encounter: Payer: Self-pay | Admitting: Family Medicine

## 2016-09-10 VITALS — BP 118/84 | HR 68 | Temp 97.6°F | Ht 62.0 in | Wt 195.5 lb

## 2016-09-10 DIAGNOSIS — E6609 Other obesity due to excess calories: Secondary | ICD-10-CM | POA: Diagnosis not present

## 2016-09-10 DIAGNOSIS — I10 Essential (primary) hypertension: Secondary | ICD-10-CM | POA: Diagnosis not present

## 2016-09-10 DIAGNOSIS — E785 Hyperlipidemia, unspecified: Secondary | ICD-10-CM | POA: Diagnosis not present

## 2016-09-10 DIAGNOSIS — R739 Hyperglycemia, unspecified: Secondary | ICD-10-CM

## 2016-09-10 DIAGNOSIS — R899 Unspecified abnormal finding in specimens from other organs, systems and tissues: Secondary | ICD-10-CM

## 2016-09-10 DIAGNOSIS — IMO0001 Reserved for inherently not codable concepts without codable children: Secondary | ICD-10-CM

## 2016-09-10 DIAGNOSIS — Z6835 Body mass index (BMI) 35.0-35.9, adult: Secondary | ICD-10-CM

## 2016-09-10 DIAGNOSIS — Z7289 Other problems related to lifestyle: Secondary | ICD-10-CM

## 2016-09-10 NOTE — Patient Instructions (Addendum)
BEFORE YOU LEAVE: -follow up: 1) FASTING lab visit in next 1 week 2) CPE up with Dr. Maudie Mercury in 4 months  We have ordered labs or studies at this visit. It can take up to 1-2 weeks for results and processing. IF results require follow up or explanation, we will call you with instructions. Clinically stable results will be released to your [facility]. If you have not heard from [place] or cannot find your results in [facility] in 2 weeks please contact our office at [phone].  If you are not yet signed up for [facility], please consider signing up.   We recommend the following healthy lifestyle for LIFE: 1) Small portions.   Tip: eat off of a salad plate instead of a dinner plate.  Tip: It is ok to feel hungry after a meal of proper portion sizes  Tip: if you need more or a snack choose fruits, veggies and/or a handful of nuts or seeds.  2) Eat a healthy clean diet.  * Tip: Avoid (less then 1 serving per week): processed foods, sweets, sweetened drinks, white starches (rice, flour, bread, potatoes, pasta, etc), red meat, fast foods, butter  *Tip: CHOOSE instead   * 5-9 servings per day of fresh or frozen fruits and vegetables (but not corn, potatoes, bananas, canned or dried fruit)   *nuts and seeds, beans   *olives and olive oil   *small portions of lean meats such as fish and white chicken    *small portions of whole grains  3)Get at least 150 minutes of sweaty aerobic exercise per week.  4)Reduce stress - consider counseling, meditation and relaxation to balance other aspects of your life.

## 2016-09-10 NOTE — Progress Notes (Signed)
Pre visit review using our clinic review tool, if applicable. No additional management support is needed unless otherwise documented below in the visit note. 

## 2016-09-10 NOTE — Progress Notes (Signed)
HPI:  Erica Sutton is a pleasant 61 y.o. here for follow up. Chronic medical problems summarized below were reviewed for changes and stability and were updated as needed below. These issues and their treatment remain stable for the most part. Advised starting crestor and healthy lifestyle 03/2016. Reports the cholesterol medicine upset her stomach so she stopped it.Feels diet and exercise have improved. Denies CP, SOB, DOE, treatment intolerance or new symptoms. She wants to recheck labs. Not fasting today  HTN: -meds norvasc 5, benazapril 30, hctz 25   HLD/Hx Hyperglycemia/Obesity: - wt 197.3 last visit --> 195.5 today -advised starting statin several times, she finally agreed 03/2016   ROS: See pertinent positives and negatives per HPI.  Past Medical History:  Diagnosis Date  . Anemia    intermittent her whole life, reports she had eval and was told to take MV  . Eclampsia   . GERD (gastroesophageal reflux disease) 08/03/2015   resolved 2016  . Hyperglycemia 02/19/2013  . Hyperlipidemia 02/19/2013  . Hypertension   . Obesity 08/03/2015    Past Surgical History:  Procedure Laterality Date  . ABDOMINAL HYSTERECTOMY    . CHOLECYSTECTOMY  11/10/12   lap chole with IOC  . shoulder shoulder      Family History  Problem Relation Age of Onset  . Diabetes Mother   . Liver disease Father   . Alcohol abuse Father     Social History   Social History  . Marital status: Married    Spouse name: N/A  . Number of children: N/A  . Years of education: N/A   Social History Main Topics  . Smoking status: Former Research scientist (life sciences)  . Smokeless tobacco: Never Used     Comment: quit in her 50s, light smoker  . Alcohol use 0.6 oz/week    1 Glasses of wine per week     Comment: occ  . Drug use: No  . Sexual activity: Yes    Birth control/ protection: Surgical     Comment: hysterectomy    Other Topics Concern  . 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  amLODipine (NORVASC) 5 MG tablet, take 1 tablet by mouth once daily, Disp: 90 tablet, Rfl: 1 .  benazepril (LOTENSIN) 20 MG tablet, take 1 tablet by mouth once daily, Disp: 90 tablet, Rfl: 1 .  hydrochlorothiazide (HYDRODIURIL) 25 MG tablet, take 1 tablet by mouth once daily, Disp: 90 tablet, Rfl: 2 .  Multiple Vitamins-Minerals (MULTIVITAMIN WITH MINERALS) tablet, Take 1 tablet by mouth daily., Disp: , Rfl:   EXAM:  Vitals:   09/10/16 1448  BP: 118/84  Pulse: 68  Temp: 97.6 F (36.4 C)    Body mass index is 35.76 kg/m.  GENERAL: vitals reviewed and listed above, alert, oriented, appears well hydrated and in no acute distress  HEENT: atraumatic, conjunttiva clear, no obvious abnormalities on inspection of external nose and ears  NECK: no obvious masses on inspection  LUNGS: clear to auscultation bilaterally, no wheezes, rales or rhonchi, Aung air movement  CV: HRRR, no peripheral edema  MS: moves all extremities without noticeable abnormality  PSYCH: pleasant and cooperative, no obvious depression or anxiety  ASSESSMENT AND PLAN:  Discussed the following assessment and plan:  Essential hypertension - Plan: Basic metabolic panel, CBC  Hyperlipidemia, unspecified hyperlipidemia type - Plan: Lipid panel  Hyperglycemia - Plan: Hemoglobin A1c  Class 2 obesity due to excess calories with serious comorbidity and body mass index (BMI) of 35.0 to 35.9 in adult  Hep C screening for baby boomer - Plan: Hepatitis C antibody  -labs -lifestyle recs; Med diet and regular exercise -consider different statin if needed -CPE in 4 months -Patient advised to return or notify a doctor immediately if symptoms worsen or persist or new concerns arise.  Patient Instructions   BEFORE YOU LEAVE: -follow up: 1) FASTING lab visit in next 1 week 2) CPE up with Dr. Maudie Mercury in 4 months  We have ordered labs or studies at this visit. It can take up to 1-2 weeks for results and processing. IF results require follow up or explanation, we will call you with instructions. Clinically stable results will be released to your [facility]. If you have not heard from [place] or cannot find your results in [facility] in 2 weeks please contact our office at [phone].  If you are not yet signed up for [facility], please consider signing up.   We recommend the following healthy lifestyle for LIFE: 1) Small portions.   Tip: eat off of a salad plate instead of a dinner plate.  Tip: It is ok to feel hungry after a meal of proper portion sizes  Tip: if you need more or a snack choose fruits, veggies and/or a handful of nuts or seeds.  2) Eat a healthy clean diet.  * Tip: Avoid (less then 1 serving per week): processed foods, sweets, sweetened drinks, white starches (rice, flour, bread, potatoes, pasta, etc), red meat, fast foods, butter  *Tip: CHOOSE instead   * 5-9 servings per day of fresh or frozen fruits and vegetables (but not corn, potatoes, bananas, canned or dried fruit)   *nuts and seeds, beans   *olives and olive oil   *small portions of lean meats such as fish and white chicken    *small portions of whole grains  3)Get at least 150 minutes of sweaty aerobic exercise per week.  4)Reduce stress - consider counseling, meditation and relaxation to balance other aspects of your life.        Colin Benton R., DO

## 2016-09-19 ENCOUNTER — Other Ambulatory Visit: Payer: 59

## 2016-09-19 LAB — LIPID PANEL
CHOL/HDL RATIO: 4
Cholesterol: 205 mg/dL — ABNORMAL HIGH (ref 0–200)
HDL: 50 mg/dL (ref >39.00)
LDL Cholesterol: 129 mg/dL — ABNORMAL HIGH (ref 0–99)
NonHDL: 155.44
Triglycerides: 132 mg/dL (ref 0.0–149.0)
VLDL: 26.4 mg/dL (ref 0.0–40.0)

## 2016-09-19 LAB — HEMOGLOBIN A1C: Hgb A1c MFr Bld: 6.1 % (ref 4.6–6.5)

## 2016-09-19 LAB — CBC
HCT: 38.1 % (ref 36.0–46.0)
HEMOGLOBIN: 13 g/dL (ref 12.0–15.0)
MCHC: 34.1 g/dL (ref 30.0–36.0)
MCV: 94.9 fl (ref 78.0–100.0)
PLATELETS: 316 10*3/uL (ref 150.0–400.0)
RBC: 4.02 Mil/uL (ref 3.87–5.11)
RDW: 13 % (ref 11.5–15.5)
WBC: 3.6 10*3/uL — AB (ref 4.0–10.5)

## 2016-09-19 LAB — BASIC METABOLIC PANEL
BUN: 10 mg/dL (ref 6–23)
CO2: 30 mEq/L (ref 19–32)
Calcium: 9.2 mg/dL (ref 8.4–10.5)
Chloride: 105 mEq/L (ref 96–112)
Creatinine, Ser: 0.94 mg/dL (ref 0.40–1.20)
GFR: 78.15 mL/min (ref >60.00)
Glucose, Bld: 116 mg/dL — ABNORMAL HIGH (ref 70–99)
POTASSIUM: 4.3 meq/L (ref 3.5–5.1)
SODIUM: 139 meq/L (ref 135–145)

## 2016-09-19 NOTE — Addendum Note (Signed)
Addended by: Agnes Lawrence on: 09/19/2016 06:31 PM   Modules accepted: Orders

## 2016-09-20 LAB — HEPATITIS C ANTIBODY: HCV Ab: NEGATIVE

## 2016-10-21 ENCOUNTER — Other Ambulatory Visit (INDEPENDENT_AMBULATORY_CARE_PROVIDER_SITE_OTHER): Payer: 59

## 2016-10-21 DIAGNOSIS — R899 Unspecified abnormal finding in specimens from other organs, systems and tissues: Secondary | ICD-10-CM | POA: Diagnosis not present

## 2016-10-21 LAB — CBC WITH DIFFERENTIAL/PLATELET
BASOS ABS: 0 10*3/uL (ref 0.0–0.1)
Basophils Relative: 0.6 % (ref 0.0–3.0)
Eosinophils Absolute: 0.2 10*3/uL (ref 0.0–0.7)
Eosinophils Relative: 3.8 % (ref 0.0–5.0)
HCT: 40.5 % (ref 36.0–46.0)
Hemoglobin: 13.6 g/dL (ref 12.0–15.0)
LYMPHS ABS: 2.1 10*3/uL (ref 0.7–4.0)
Lymphocytes Relative: 40.8 % (ref 12.0–46.0)
MCHC: 33.5 g/dL (ref 30.0–36.0)
MCV: 95 fl (ref 78.0–100.0)
MONO ABS: 0.4 10*3/uL (ref 0.1–1.0)
MONOS PCT: 7.2 % (ref 3.0–12.0)
NEUTROS ABS: 2.5 10*3/uL (ref 1.4–7.7)
Neutrophils Relative %: 47.6 % (ref 43.0–77.0)
PLATELETS: 334 10*3/uL (ref 150.0–400.0)
RBC: 4.26 Mil/uL (ref 3.87–5.11)
RDW: 12.6 % (ref 11.5–15.5)
WBC: 5.2 10*3/uL (ref 4.0–10.5)

## 2016-11-20 DIAGNOSIS — H68101 Unspecified obstruction of Eustachian tube, right ear: Secondary | ICD-10-CM | POA: Diagnosis not present

## 2016-11-20 DIAGNOSIS — Z01411 Encounter for gynecological examination (general) (routine) with abnormal findings: Secondary | ICD-10-CM | POA: Diagnosis not present

## 2016-11-22 ENCOUNTER — Ambulatory Visit (INDEPENDENT_AMBULATORY_CARE_PROVIDER_SITE_OTHER): Payer: 59 | Admitting: Family Medicine

## 2016-11-22 ENCOUNTER — Encounter: Payer: Self-pay | Admitting: Family Medicine

## 2016-11-22 VITALS — BP 128/88 | HR 84 | Temp 98.0°F | Ht 62.0 in | Wt 191.8 lb

## 2016-11-22 DIAGNOSIS — J0100 Acute maxillary sinusitis, unspecified: Secondary | ICD-10-CM

## 2016-11-22 DIAGNOSIS — H66001 Acute suppurative otitis media without spontaneous rupture of ear drum, right ear: Secondary | ICD-10-CM | POA: Diagnosis not present

## 2016-11-22 MED ORDER — BENZONATATE 100 MG PO CAPS: 100.0000 mg | ORAL_CAPSULE | Freq: Two times a day (BID) | ORAL | 0 refills | Status: DC | PRN

## 2016-11-22 MED ORDER — AMOXICILLIN-POT CLAVULANATE 875-125 MG PO TABS: 1.0000 | ORAL_TABLET | Freq: Two times a day (BID) | ORAL | 0 refills | Status: DC

## 2016-11-22 NOTE — Progress Notes (Signed)
Pre visit review using our clinic review tool, if applicable. No additional management support is needed unless otherwise documented below in the visit note. 

## 2016-11-22 NOTE — Progress Notes (Signed)
HPI:  Acute visit for URI: -started: 2 weeks ago -symptoms:nasal congestion, sore throat, cough - now worsening the last few days with thick yellow sinus congestion, maxillary sinus pressure and pain and R ear discomfort -denies:fever, SOB, NVD, tooth pain, body aches -has tried: nothing -sick contacts/travel/risks: no reported flu, strep or tick exposure  ROS: See pertinent positives and negatives per HPI.  Past Medical History:  Diagnosis Date  . Anemia    intermittent her whole life, reports she had eval and was told to take MV  . Eclampsia   . GERD (gastroesophageal reflux disease) 08/03/2015   resolved 2016  . Hyperglycemia 02/19/2013  . Hyperlipidemia 02/19/2013  . Hypertension   . Obesity 08/03/2015    Past Surgical History:  Procedure Laterality Date  . ABDOMINAL HYSTERECTOMY    . CHOLECYSTECTOMY  11/10/12   lap chole with IOC  . shoulder shoulder      Family History  Problem Relation Age of Onset  . Diabetes Mother   . Liver disease Father   . Alcohol abuse Father     Social History   Social History  . Marital status: Married    Spouse name: N/A  . Number of children: N/A  . Years of education: N/A   Social History Main Topics  . Smoking status: Former Research scientist (life sciences)  . Smokeless tobacco: Never Used     Comment: quit in her 45s, light smoker  . Alcohol use 0.6 oz/week    1 Glasses of wine per week     Comment: occ  . Drug use: No  . Sexual activity: Yes    Birth control/ protection: Surgical     Comment: hysterectomy    Other Topics Concern  . 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  amLODipine (NORVASC) 5 MG tablet, take 1 tablet by mouth once daily, Disp: 90 tablet, Rfl: 1 .  benazepril (LOTENSIN) 20 MG  tablet, take 1 tablet by mouth once daily, Disp: 90 tablet, Rfl: 1 .  hydrochlorothiazide (HYDRODIURIL) 25 MG tablet, take 1 tablet by mouth once daily, Disp: 90 tablet, Rfl: 2 .  Multiple Vitamins-Minerals (MULTIVITAMIN WITH MINERALS) tablet, Take 1 tablet by mouth daily., Disp: , Rfl:  .  amoxicillin-clavulanate (AUGMENTIN) 875-125 MG tablet, Take 1 tablet by mouth 2 (two) times daily., Disp: 20 tablet, Rfl: 0 .  benzonatate (TESSALON PERLES) 100 MG capsule, Take 1 capsule (100 mg total) by mouth 2 (two) times daily as needed for cough., Disp: 20 capsule, Rfl: 0  EXAM:  Vitals:   11/22/16 1108  BP: 128/88  Pulse: 84  Temp: 98 F (36.7 C)    Body mass index is 35.08 kg/m.  GENERAL: vitals reviewed and listed above, alert, oriented, appears well hydrated and in no acute distress  HEENT: atraumatic, conjunttiva clear, no obvious abnormalities on inspection of external nose and ears, normal appearance of ear canals and TMs except for red, dull and bulging R TM, thick nasal congestion, mild post oropharyngeal erythema with PND, no tonsillar edema or exudate, no sinus TTP  NECK: no obvious masses on inspection  LUNGS: clear to auscultation bilaterally, no wheezes, rales or rhonchi, Depner air movement  CV: HRRR, no peripheral edema  MS: moves all extremities without noticeable abnormality  PSYCH: pleasant and cooperative, no obvious depression or anxiety  ASSESSMENT AND PLAN:  Discussed the following assessment and plan:  Acute non-recurrent maxillary sinusitis  Acute suppurative otitis media of right ear without spontaneous rupture of tympanic membrane, recurrence not specified  -We discussed potential etiologies, with R OM and sinusitis being most likely. Will tx with augmentin and tessalon for cough. We discussed treatment side effects, likely course,  and signs of developing a serious illness. -of course, we advised to return or notify a doctor immediately if symptoms worsen  or persist or new concerns arise.  Declined avs.  There are no Patient Instructions on file for this visit.  Colin Benton R., DO

## 2016-12-17 ENCOUNTER — Other Ambulatory Visit: Payer: Self-pay | Admitting: Family Medicine

## 2017-01-05 NOTE — Progress Notes (Signed)
HPI:  Here for CPE: Due for BP labs, colon ca screening in [place]. Sees Dr. Raphael Gibney, gyn, for women's health.  -Concerns and/or follow up today:   HTN: -meds norvasc 5, benazapril 30, hctz 25 - has not taken meds yet this morning  HLD/Hx Hyperglycemia/Obesity: - wt 197.3  --> 195.5 last follow up 2/18 --> 192.5 -she is working on exercise - classes 3 days per week and healthier diet - reports is watching calories -advised starting statin several times, she finally agreed 03/2016 - upset her stomach  -Taking folic acid, vitamin D or calcium: takes multivitamin  -Diabetes and Dyslipidemia Screening: fasting for labs  -Vaccines: UTD except flu vaccine  -pap history: does women's health with her gynecologist, reports all up to date  -FDLMP: n/a  -wants STI testing (Hep C if born [birth year]): no, declined  -FH breast, colon or ovarian ca: see FH Last mammogram: sees gyn, last mammo 07/2016 birads 1 Last colon cancer screening: 10.2008 normal colonoscopy with recs to repeat screening in 10 years - she prefers to do cologard and asks [place] to order   -Alcohol, Tobacco, drug use: see social history  Review of Systems - no fevers, unintentional weight loss, vision loss, hearing loss, chest pain, sob, hemoptysis, melena, hematochezia, hematuria, genital discharge, changing or concerning skin lesions, bleeding, bruising, loc, thoughts of self harm or SI  Past Medical History:  Diagnosis Date  . Anemia    intermittent her whole life, reports she had eval and was told to take MV  . Eclampsia   . GERD (gastroesophageal reflux disease) 08/03/2015   resolved 2016  . Hyperglycemia 02/19/2013  . Hyperlipidemia 02/19/2013  . Hypertension   . Obesity 08/03/2015    Past Surgical History:  Procedure Laterality Date  . ABDOMINAL HYSTERECTOMY    . CHOLECYSTECTOMY  11/10/12   lap chole with IOC  . shoulder shoulder      Family History  Problem Relation Age of Onset  . Diabetes Mother   .  Liver disease Father   . Alcohol abuse Father     Social History   Social History  . Marital status: Married    Spouse name: N/A  . Number of children: N/A  . Years of education: N/A   Social History Main Topics  . Smoking status: Former Research scientist (life sciences)  . Smokeless tobacco: Never Used     Comment: quit in her 74s, light smoker  . Alcohol use 1.8 oz/week    3 Glasses of wine per week     Comment: occ  . Drug use: No  . Sexual activity: Yes    Birth control/ protection: Surgical     Comment: hysterectomy    Other Topics Concern  . 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  amLODipine (NORVASC) 5 MG tablet, Take 1 tablet (5 mg total) by mouth daily., Disp: 90 tablet, Rfl: 3 .  benazepril (LOTENSIN) 20 MG tablet, Take 1 tablet (20 mg total) by mouth daily., Disp: 90 tablet, Rfl: 3 .  hydrochlorothiazide (HYDRODIURIL) 25 MG tablet, Take 1 tablet (25 mg total) by mouth daily., Disp: 90 tablet, Rfl: 3 .  Multiple Vitamins-Minerals (MULTIVITAMIN WITH MINERALS)  tablet, Take 1 tablet by mouth daily., Disp: , Rfl:   EXAM:  Vitals:   01/06/17 0813  BP: 122/86  Temp: 97.4 F (36.3 C)   Body mass index is 35.78 kg/m.  GENERAL: vitals reviewed and listed below, alert, oriented, appears well hydrated and in no acute distress  HEENT: head atraumatic, PERRLA, normal appearance of eyes, ears, nose and mouth. moist mucus membranes.  NECK: supple, no masses or lymphadenopathy  LUNGS: clear to auscultation bilaterally, no rales, rhonchi or wheeze  CV: HRRR, no peripheral edema or cyanosis, normal pedal pulses  ABDOMEN: bowel sounds normal, soft, non tender to palpation, no masses, no rebound or guarding  SKIN: no rash or abnormal  lesions  GU/BREAST: declined, does with gyn  MS: normal gait, moves all extremities normally  NEURO: normal gait, speech and thought processing grossly intact, muscle tone grossly intact throughout  PSYCH: normal affect, pleasant and cooperative  ASSESSMENT AND PLAN:  Discussed the following assessment and plan:  Encounter for preventive health examination  Essential hypertension  Hyperlipidemia, unspecified hyperlipidemia type  Hyperglycemia  Class 2 severe obesity due to excess calories with serious comorbidity and body mass index (BMI) of 35.0 to 35.9 in adult [facility])   -Discussed and advised all [place] preventive services health task force level A and B recommendations for age, sex and risks.  -Advised at least 150 minutes of exercise per week and a healthy diet with avoidance of (less then 1 serving per week) processed foods, white starches, red meat, fast foods and sweets and consisting of: * 5-9 servings of fresh fruits and vegetables (not corn or potatoes) *nuts and seeds, beans *olives and olive oil *lean meats such as fish and white chicken  *whole grains  -BP was a little high on initial check, she had not taken her BP meds this morning, better on recheck  -she opted to hold off on labs today and do at next visit  -she prefers cologard for next colon cancer screening after discussion options and advised assistant to order  -labs, studies and vaccines per orders this encounter  No orders of the defined types were placed in this encounter.   Patient advised to return to clinic immediately if symptoms worsen or persist or new concerns.  Patient Instructions  BEFORE YOU LEAVE: -follow up: 3-4 months -please order the cologard test for October completion and provide pt with information -we can do labs next visit if she wishes   We recommend the following healthy lifestyle for LIFE: 1) Small portions.   Tip: eat off of a salad plate instead of a dinner  plate.  Tip: It is ok to feel hungry after a meal - that likely means you ate an appropriate portion.  Tip: if you need more or a snack choose fruits, veggies and/or a handful of nuts or seeds.  2) Eat a healthy clean diet.  * Tip: Avoid (less then 1 serving per week): processed foods, sweets, sweetened drinks, white starches (rice, flour, bread, potatoes, pasta, etc), red meat, fast foods, butter  *Tip: CHOOSE instead   * 5-9 servings per day of fresh or frozen fruits and vegetables (but not corn, potatoes, bananas, canned or dried fruit)   *nuts and seeds, beans   *olives and olive oil   *small portions of lean meats such as fish and white chicken    *small portions of whole grains  3)Get at least 150 minutes of sweaty aerobic exercise per week.  4)Reduce stress - consider  counseling, meditation and relaxation to balance other aspects of your life.    Health Maintenance for Postmenopausal Women Menopause is a normal process in which your reproductive ability comes to an end. This process happens gradually over a span of months to years, usually between the ages of 62 and 71. Menopause is complete when you have missed 12 consecutive menstrual periods. It is important to talk with your health care provider about some of the most common conditions that affect postmenopausal women, such as heart disease, cancer, and bone loss (osteoporosis). Adopting a healthy lifestyle and getting preventive care can help to promote your health and wellness. Those actions can also lower your chances of developing some of these common conditions. What should I know about menopause? During menopause, you may experience a number of symptoms, such as:  Moderate-to-severe hot flashes.  Night sweats.  Decrease in sex drive.  Mood swings.  Headaches.  Tiredness.  Irritability.  Memory problems.  Insomnia.  Choosing to treat or not to treat menopausal changes is an individual decision that you make  with your health care provider. What should I know about hormone replacement therapy and supplements? Hormone therapy products are effective for treating symptoms that are associated with menopause, such as hot flashes and night sweats. Hormone replacement carries certain risks, especially as you become older. If you are thinking about using estrogen or estrogen with progestin treatments, discuss the benefits and risks with your health care provider. What should I know about heart disease and stroke? Heart disease, heart attack, and stroke become more likely as you age. This may be due, in part, to the hormonal changes that your body experiences during menopause. These can affect how your body processes dietary fats, triglycerides, and cholesterol. Heart attack and stroke are both medical emergencies. There are many things that you can do to help prevent heart disease and stroke:  Have your blood pressure checked at least every 1-2 years. High blood pressure causes heart disease and increases the risk of stroke.  If you are 76-55 years old, ask your health care provider if you should take aspirin to prevent a heart attack or a stroke.  Do not use any tobacco products, including cigarettes, chewing tobacco, or electronic cigarettes. If you need help quitting, ask your health care provider.  It is important to eat a healthy diet and maintain a healthy weight. ? Be sure to include plenty of vegetables, fruits, low-fat dairy products, and lean protein. ? Avoid eating foods that are high in solid fats, added sugars, or salt (sodium).  Get regular exercise. This is one of the most important things that you can do for your health. ? Try to exercise for at least 150 minutes each week. The type of exercise that you do should increase your heart rate and make you sweat. This is known as moderate-intensity exercise. ? Try to do strengthening exercises at least twice each week. Do these in addition to the  moderate-intensity exercise.  Know your numbers.Ask your health care provider to check your cholesterol and your blood glucose. Continue to have your blood tested as directed by your health care provider.  What should I know about cancer screening? There are several types of cancer. Take the following steps to reduce your risk and to catch any cancer development as early as possible. Breast Cancer  Practice breast self-awareness. ? This means understanding how your breasts normally appear and feel. ? It also means doing regular breast self-exams. Let your health  care provider know about any changes, no matter how small.  If you are 26 or older, have a clinician do a breast exam (clinical breast exam or CBE) every year. Depending on your age, family history, and medical history, it may be recommended that you also have a yearly breast X-ray (mammogram).  If you have a family history of breast cancer, talk with your health care provider about genetic screening.  If you are at high risk for breast cancer, talk with your health care provider about having an MRI and a mammogram every year.  Breast cancer (BRCA) gene test is recommended for women who have family members with BRCA-related cancers. Results of the assessment will determine the need for genetic counseling and BRCA1 and for BRCA2 testing. BRCA-related cancers include these types: ? Breast. This occurs in males or females. ? Ovarian. ? Tubal. This may also be called fallopian tube cancer. ? Cancer of the abdominal or pelvic lining (peritoneal cancer). ? Prostate. ? Pancreatic.  Cervical, Uterine, and Ovarian Cancer Your health care provider may recommend that you be screened regularly for cancer of the pelvic organs. These include your ovaries, uterus, and vagina. This screening involves a pelvic exam, which includes checking for microscopic changes to the surface of your cervix (Pap test).  For women ages 21-65, health care  providers may recommend a pelvic exam and a Pap test every three years. For women ages 35-65, they may recommend the Pap test and pelvic exam, combined with testing for human papilloma virus (HPV), every five years. Some types of HPV increase your risk of cervical cancer. Testing for HPV may also be done on women of any age who have unclear Pap test results.  Other health care providers may not recommend any screening for nonpregnant women who are considered low risk for pelvic cancer and have no symptoms. Ask your health care provider if a screening pelvic exam is right for you.  If you have had past treatment for cervical cancer or a condition that could lead to cancer, you need Pap tests and screening for cancer for at least 20 years after your treatment. If Pap tests have been discontinued for you, your risk factors (such as having a new sexual partner) need to be reassessed to determine if you should start having screenings again. Some women have medical problems that increase the chance of getting cervical cancer. In these cases, your health care provider may recommend that you have screening and Pap tests more often.  If you have a family history of uterine cancer or ovarian cancer, talk with your health care provider about genetic screening.  If you have vaginal bleeding after reaching menopause, tell your health care provider.  There are currently no reliable tests available to screen for ovarian cancer.  Lung Cancer Lung cancer screening is recommended for adults 24-54 years old who are at high risk for lung cancer because of a history of smoking. A yearly low-dose CT scan of the lungs is recommended if you:  Currently smoke.  Have a history of at least 30 pack-years of smoking and you currently smoke or have quit within the past 15 years. A pack-year is smoking an average of one pack of cigarettes per day for one year.  Yearly screening should:  Continue until it has been 15 years  since you quit.  Stop if you develop a health problem that would prevent you from having lung cancer treatment.  Colorectal Cancer  This type of cancer can  be detected and can often be prevented.  Routine colorectal cancer screening usually begins at age 21 and continues through age 70.  If you have risk factors for colon cancer, your health care provider may recommend that you be screened at an earlier age.  If you have a family history of colorectal cancer, talk with your health care provider about genetic screening.  Your health care provider may also recommend using home test kits to check for hidden blood in your stool.  A small camera at the end of a tube can be used to examine your colon directly (sigmoidoscopy or colonoscopy). This is done to check for the earliest forms of colorectal cancer.  Direct examination of the colon should be repeated every 5-10 years until age 2. However, if early forms of precancerous polyps or small growths are found or if you have a family history or genetic risk for colorectal cancer, you may need to be screened more often.  Skin Cancer  Check your skin from head to toe regularly.  Monitor any moles. Be sure to tell your health care provider: ? About any new moles or changes in moles, especially if there is a change in a mole's shape or color. ? If you have a mole that is larger than the size of a pencil eraser.  If any of your family members has a history of skin cancer, especially at a young age, talk with your health care provider about genetic screening.  Always use sunscreen. Apply sunscreen liberally and repeatedly throughout the day.  Whenever you are outside, protect yourself by wearing long sleeves, pants, a wide-brimmed hat, and sunglasses.  What should I know about osteoporosis? Osteoporosis is a condition in which bone destruction happens more quickly than new bone creation. After menopause, you may be at an increased risk for  osteoporosis. To help prevent osteoporosis or the bone fractures that can happen because of osteoporosis, the following is recommended:  If you are 32-90 years old, get at least 1,000 mg of calcium and at least 600 mg of vitamin D per day.  If you are older than age 28 but younger than age 68, get at least 1,200 mg of calcium and at least 600 mg of vitamin D per day.  If you are older than age 55, get at least 1,200 mg of calcium and at least 800 mg of vitamin D per day.  Smoking and excessive alcohol intake increase the risk of osteoporosis. Eat foods that are rich in calcium and vitamin D, and do weight-bearing exercises several times each week as directed by your health care provider. What should I know about how menopause affects my mental health? Depression may occur at any age, but it is more common as you become older. Common symptoms of depression include:  Low or sad mood.  Changes in sleep patterns.  Changes in appetite or eating patterns.  Feeling an overall lack of motivation or enjoyment of activities that you previously enjoyed.  Frequent crying spells.  Talk with your health care provider if you think that you are experiencing depression. What should I know about immunizations? It is important that you get and maintain your immunizations. These include:  Tetanus, diphtheria, and pertussis (Tdap) booster vaccine.  Influenza every year before the flu season begins.  Pneumonia vaccine.  Shingles vaccine.  Your health care provider may also recommend other immunizations. This information is not intended to replace advice given to you by your health care provider. Make sure  you discuss any questions you have with your health care provider. Document Released: 08/30/2005 Document Revised: 01/26/2016 Document Reviewed: 04/11/2015 Elsevier Interactive Patient Education  2018 [employer].     No Follow-up on file.  Colin Benton R., DO

## 2017-01-06 ENCOUNTER — Telehealth: Payer: Self-pay | Admitting: Family Medicine

## 2017-01-06 ENCOUNTER — Ambulatory Visit (INDEPENDENT_AMBULATORY_CARE_PROVIDER_SITE_OTHER): Payer: 59 | Admitting: Family Medicine

## 2017-01-06 ENCOUNTER — Encounter: Payer: Self-pay | Admitting: Family Medicine

## 2017-01-06 VITALS — BP 122/86 | Temp 97.4°F | Ht 61.5 in | Wt 192.5 lb

## 2017-01-06 DIAGNOSIS — R739 Hyperglycemia, unspecified: Secondary | ICD-10-CM | POA: Diagnosis not present

## 2017-01-06 DIAGNOSIS — E785 Hyperlipidemia, unspecified: Secondary | ICD-10-CM | POA: Diagnosis not present

## 2017-01-06 DIAGNOSIS — I10 Essential (primary) hypertension: Secondary | ICD-10-CM | POA: Diagnosis not present

## 2017-01-06 DIAGNOSIS — Z6835 Body mass index (BMI) 35.0-35.9, adult: Secondary | ICD-10-CM

## 2017-01-06 DIAGNOSIS — Z0000 Encounter for general adult medical examination without abnormal findings: Secondary | ICD-10-CM | POA: Diagnosis not present

## 2017-01-06 MED ORDER — AMLODIPINE BESYLATE 5 MG PO TABS
5.0000 mg | ORAL_TABLET | Freq: Every day | ORAL | 3 refills | Status: DC
Start: 2017-01-06 — End: 2018-03-13

## 2017-01-06 MED ORDER — HYDROCHLOROTHIAZIDE 25 MG PO TABS: 25.0000 mg | ORAL_TABLET | Freq: Every day | ORAL | 3 refills | Status: DC

## 2017-01-06 MED ORDER — BENAZEPRIL HCL 20 MG PO TABS: 20.0000 mg | ORAL_TABLET | Freq: Every day | ORAL | 3 refills | Status: DC

## 2017-01-06 NOTE — Telephone Encounter (Signed)
Pt needs a cologuard test in Oct per Dr. Maudie Mercury.  Will need to order.

## 2017-01-06 NOTE — Patient Instructions (Signed)
BEFORE YOU LEAVE: -follow up: 3-4 months -please order the cologard test for October completion and provide pt with information -we can do labs next visit if she wishes   We recommend the following healthy lifestyle for LIFE: 1) Small portions.   Tip: eat off of a salad plate instead of a dinner plate.  Tip: It is ok to feel hungry after a meal - that likely means you ate an appropriate portion.  Tip: if you need more or a snack choose fruits, veggies and/or a handful of nuts or seeds.  2) Eat a healthy clean diet.  * Tip: Avoid (less then 1 serving per week): processed foods, sweets, sweetened drinks, white starches (rice, flour, bread, potatoes, pasta, etc), red meat, fast foods, butter  *Tip: CHOOSE instead   * 5-9 servings per day of fresh or frozen fruits and vegetables (but not corn, potatoes, bananas, canned or dried fruit)   *nuts and seeds, beans   *olives and olive oil   *small portions of lean meats such as fish and white chicken    *small portions of whole grains  3)Get at least 150 minutes of sweaty aerobic exercise per week.  4)Reduce stress - consider counseling, meditation and relaxation to balance other aspects of your life.    Health Maintenance for Postmenopausal Women Menopause is a normal process in which your reproductive ability comes to an end. This process happens gradually over a span of months to years, usually between the ages of 19 and 30. Menopause is complete when you have missed 12 consecutive menstrual periods. It is important to talk with your health care provider about some of the most common conditions that affect postmenopausal women, such as heart disease, cancer, and bone loss (osteoporosis). Adopting a healthy lifestyle and getting preventive care can help to promote your health and wellness. Those actions can also lower your chances of developing some of these common conditions. What should I know about menopause? During menopause, you may  experience a number of symptoms, such as:  Moderate-to-severe hot flashes.  Night sweats.  Decrease in sex drive.  Mood swings.  Headaches.  Tiredness.  Irritability.  Memory problems.  Insomnia.  Choosing to treat or not to treat menopausal changes is an individual decision that you make with your health care provider. What should I know about hormone replacement therapy and supplements? Hormone therapy products are effective for treating symptoms that are associated with menopause, such as hot flashes and night sweats. Hormone replacement carries certain risks, especially as you become older. If you are thinking about using estrogen or estrogen with progestin treatments, discuss the benefits and risks with your health care provider. What should I know about heart disease and stroke? Heart disease, heart attack, and stroke become more likely as you age. This may be due, in part, to the hormonal changes that your body experiences during menopause. These can affect how your body processes dietary fats, triglycerides, and cholesterol. Heart attack and stroke are both medical emergencies. There are many things that you can do to help prevent heart disease and stroke:  Have your blood pressure checked at least every 1-2 years. High blood pressure causes heart disease and increases the risk of stroke.  If you are 59-82 years old, ask your health care provider if you should take aspirin to prevent a heart attack or a stroke.  Do not use any tobacco products, including cigarettes, chewing tobacco, or electronic cigarettes. If you need help quitting, ask your health  care provider.  It is important to eat a healthy diet and maintain a healthy weight. ? Be sure to include plenty of vegetables, fruits, low-fat dairy products, and lean protein. ? Avoid eating foods that are high in solid fats, added sugars, or salt (sodium).  Get regular exercise. This is one of the most important things  that you can do for your health. ? Try to exercise for at least 150 minutes each week. The type of exercise that you do should increase your heart rate and make you sweat. This is known as moderate-intensity exercise. ? Try to do strengthening exercises at least twice each week. Do these in addition to the moderate-intensity exercise.  Know your numbers.Ask your health care provider to check your cholesterol and your blood glucose. Continue to have your blood tested as directed by your health care provider.  What should I know about cancer screening? There are several types of cancer. Take the following steps to reduce your risk and to catch any cancer development as early as possible. Breast Cancer  Practice breast self-awareness. ? This means understanding how your breasts normally appear and feel. ? It also means doing regular breast self-exams. Let your health care provider know about any changes, no matter how small.  If you are 78 or older, have a clinician do a breast exam (clinical breast exam or CBE) every year. Depending on your age, family history, and medical history, it may be recommended that you also have a yearly breast X-ray (mammogram).  If you have a family history of breast cancer, talk with your health care provider about genetic screening.  If you are at high risk for breast cancer, talk with your health care provider about having an MRI and a mammogram every year.  Breast cancer (BRCA) gene test is recommended for women who have family members with BRCA-related cancers. Results of the assessment will determine the need for genetic counseling and BRCA1 and for BRCA2 testing. BRCA-related cancers include these types: ? Breast. This occurs in males or females. ? Ovarian. ? Tubal. This may also be called fallopian tube cancer. ? Cancer of the abdominal or pelvic lining (peritoneal cancer). ? Prostate. ? Pancreatic.  Cervical, Uterine, and Ovarian Cancer Your health  care provider may recommend that you be screened regularly for cancer of the pelvic organs. These include your ovaries, uterus, and vagina. This screening involves a pelvic exam, which includes checking for microscopic changes to the surface of your cervix (Pap test).  For women ages 21-65, health care providers may recommend a pelvic exam and a Pap test every three years. For women ages 91-65, they may recommend the Pap test and pelvic exam, combined with testing for human papilloma virus (HPV), every five years. Some types of HPV increase your risk of cervical cancer. Testing for HPV may also be done on women of any age who have unclear Pap test results.  Other health care providers may not recommend any screening for nonpregnant women who are considered low risk for pelvic cancer and have no symptoms. Ask your health care provider if a screening pelvic exam is right for you.  If you have had past treatment for cervical cancer or a condition that could lead to cancer, you need Pap tests and screening for cancer for at least 20 years after your treatment. If Pap tests have been discontinued for you, your risk factors (such as having a new sexual partner) need to be reassessed to determine if you should  start having screenings again. Some women have medical problems that increase the chance of getting cervical cancer. In these cases, your health care provider may recommend that you have screening and Pap tests more often.  If you have a family history of uterine cancer or ovarian cancer, talk with your health care provider about genetic screening.  If you have vaginal bleeding after reaching menopause, tell your health care provider.  There are currently no reliable tests available to screen for ovarian cancer.  Lung Cancer Lung cancer screening is recommended for adults 43-51 years old who are at high risk for lung cancer because of a history of smoking. A yearly low-dose CT scan of the lungs is  recommended if you:  Currently smoke.  Have a history of at least 30 pack-years of smoking and you currently smoke or have quit within the past 15 years. A pack-year is smoking an average of one pack of cigarettes per day for one year.  Yearly screening should:  Continue until it has been 15 years since you quit.  Stop if you develop a health problem that would prevent you from having lung cancer treatment.  Colorectal Cancer  This type of cancer can be detected and can often be prevented.  Routine colorectal cancer screening usually begins at age 56 and continues through age 86.  If you have risk factors for colon cancer, your health care provider may recommend that you be screened at an earlier age.  If you have a family history of colorectal cancer, talk with your health care provider about genetic screening.  Your health care provider may also recommend using home test kits to check for hidden blood in your stool.  A small camera at the end of a tube can be used to examine your colon directly (sigmoidoscopy or colonoscopy). This is done to check for the earliest forms of colorectal cancer.  Direct examination of the colon should be repeated every 5-10 years until age 12. However, if early forms of precancerous polyps or small growths are found or if you have a family history or genetic risk for colorectal cancer, you may need to be screened more often.  Skin Cancer  Check your skin from head to toe regularly.  Monitor any moles. Be sure to tell your health care provider: ? About any new moles or changes in moles, especially if there is a change in a mole's shape or color. ? If you have a mole that is larger than the size of a pencil eraser.  If any of your family members has a history of skin cancer, especially at a young age, talk with your health care provider about genetic screening.  Always use sunscreen. Apply sunscreen liberally and repeatedly throughout the  day.  Whenever you are outside, protect yourself by wearing long sleeves, pants, a wide-brimmed hat, and sunglasses.  What should I know about osteoporosis? Osteoporosis is a condition in which bone destruction happens more quickly than new bone creation. After menopause, you may be at an increased risk for osteoporosis. To help prevent osteoporosis or the bone fractures that can happen because of osteoporosis, the following is recommended:  If you are 74-61 years old, get at least 1,000 mg of calcium and at least 600 mg of vitamin D per day.  If you are older than age 67 but younger than age 38, get at least 1,200 mg of calcium and at least 600 mg of vitamin D per day.  If you are older  than age 76, get at least 1,200 mg of calcium and at least 800 mg of vitamin D per day.  Smoking and excessive alcohol intake increase the risk of osteoporosis. Eat foods that are rich in calcium and vitamin D, and do weight-bearing exercises several times each week as directed by your health care provider. What should I know about how menopause affects my mental health? Depression may occur at any age, but it is more common as you become older. Common symptoms of depression include:  Low or sad mood.  Changes in sleep patterns.  Changes in appetite or eating patterns.  Feeling an overall lack of motivation or enjoyment of activities that you previously enjoyed.  Frequent crying spells.  Talk with your health care provider if you think that you are experiencing depression. What should I know about immunizations? It is important that you get and maintain your immunizations. These include:  Tetanus, diphtheria, and pertussis (Tdap) booster vaccine.  Influenza every year before the flu season begins.  Pneumonia vaccine.  Shingles vaccine.  Your health care provider may also recommend other immunizations. This information is not intended to replace advice given to you by your health care provider.  Make sure you discuss any questions you have with your health care provider. Document Released: 08/30/2005 Document Revised: 01/26/2016 Document Reviewed: 04/11/2015 Elsevier Interactive Patient Education  2018 [employer].

## 2017-03-10 ENCOUNTER — Encounter: Payer: Self-pay | Admitting: *Deleted

## 2017-04-24 NOTE — Telephone Encounter (Signed)
I called the pt and informed her the orders were sent to [facility] 2 days ago and she should receive the kit soon and if not to call our office back.

## 2017-05-06 NOTE — Progress Notes (Deleted)
  HPI:    ROS: See pertinent positives and negatives per HPI.  Past Medical History:  Diagnosis Date  . Anemia    intermittent her whole life, reports she had eval and was told to take MV  . Eclampsia   . GERD (gastroesophageal reflux disease) 08/03/2015   resolved 2016  . Hyperglycemia 02/19/2013  . Hyperlipidemia 02/19/2013  . Hypertension   . Obesity 08/03/2015    Past Surgical History:  Procedure Laterality Date  . ABDOMINAL HYSTERECTOMY    . CHOLECYSTECTOMY  11/10/12   lap chole with IOC  . shoulder shoulder      Family History  Problem Relation Age of Onset  . Diabetes Mother   . Liver disease Father   . Alcohol abuse Father     Social History   Social History  . Marital status: Married    Spouse name: N/A  . Number of children: N/A  . Years of education: N/A   Social History Main Topics  . Smoking status: Former Research scientist (life sciences)  . Smokeless tobacco: Never Used     Comment: quit in her 31s, light smoker  . Alcohol use 1.8 oz/week    3 Glasses of wine per week     Comment: occ  . Drug use: No  . Sexual activity: Yes    Birth control/ protection: Surgical     Comment: hysterectomy    Other Topics Concern  . 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  amLODipine (NORVASC) 5 MG tablet, Take 1 tablet (5 mg total) by mouth daily., Disp: 90 tablet, Rfl: 3 .  benazepril (LOTENSIN) 20 MG tablet, Take 1 tablet (20 mg total) by mouth daily., Disp: 90 tablet, Rfl: 3 .  hydrochlorothiazide (HYDRODIURIL) 25 MG tablet, Take 1 tablet (25 mg total) by mouth daily., Disp: 90 tablet, Rfl: 3 .  Multiple Vitamins-Minerals (MULTIVITAMIN WITH MINERALS) tablet, Take 1 tablet by mouth daily., Disp: , Rfl:   EXAM:  There were no  vitals filed for this visit.  There is no height or weight on file to calculate BMI.  GENERAL: vitals reviewed and listed above, alert, oriented, appears well hydrated and in no acute distress  HEENT: atraumatic, conjunttiva clear, no obvious abnormalities on inspection of external nose and ears  NECK: no obvious masses on inspection  LUNGS: clear to auscultation bilaterally, no wheezes, rales or rhonchi, [place] air movement  CV: HRRR, no peripheral edema  MS: moves all extremities without noticeable abnormality  PSYCH: pleasant and cooperative, no obvious depression or anxiety  ASSESSMENT AND PLAN:  Discussed the following assessment and plan:  No diagnosis found.  -Patient advised to return or notify a doctor immediately if symptoms worsen or persist or new concerns arise.  There are no Patient Instructions on file for this visit.  Colin Benton R., DO

## 2017-05-08 ENCOUNTER — Ambulatory Visit: Payer: Self-pay | Admitting: Family Medicine

## 2017-05-08 ENCOUNTER — Encounter: Payer: Self-pay | Admitting: Family Medicine

## 2017-05-08 DIAGNOSIS — Z0289 Encounter for other administrative examinations: Secondary | ICD-10-CM

## 2017-05-08 NOTE — Progress Notes (Signed)
Erica Sutton is a pleasant 61 y.o. - NO SHOW for appointment today.   On review chart prior to appt:  Due for labs, flu shot  HTN: -meds norvasc 5, benazapril 30, hctz 25 - has not taken meds yet this morning  HLD/Hx Hyperglycemia/Obesity: - wt 197.3  --> 195.5 2/18 --> 192 (6/18) --> -she was working on exercise - classes 3 days per week and healthier diet - reports is watching calories -advised starting statin several times, she finally agreed 03/2016 - upset her stomach

## 2017-05-21 DIAGNOSIS — Z1211 Encounter for screening for malignant neoplasm of colon: Secondary | ICD-10-CM | POA: Diagnosis not present

## 2017-05-21 DIAGNOSIS — Z1212 Encounter for screening for malignant neoplasm of rectum: Secondary | ICD-10-CM | POA: Diagnosis not present

## 2017-05-21 LAB — COLOGUARD: COLOGUARD: NEGATIVE

## 2017-06-02 ENCOUNTER — Encounter: Payer: Self-pay | Admitting: Family Medicine

## 2017-08-05 ENCOUNTER — Other Ambulatory Visit: Payer: Self-pay | Admitting: Obstetrics and Gynecology

## 2017-08-05 DIAGNOSIS — Z1231 Encounter for screening mammogram for malignant neoplasm of breast: Secondary | ICD-10-CM

## 2017-08-26 ENCOUNTER — Ambulatory Visit
Admission: RE | Admit: 2017-08-26 | Discharge: 2017-08-26 | Disposition: A | Discharge status: Home / Self Care | Payer: 59 | Source: Ambulatory Visit | Attending: Obstetrics and Gynecology | Admitting: Obstetrics and Gynecology

## 2017-08-26 DIAGNOSIS — Z1231 Encounter for screening mammogram for malignant neoplasm of breast: Secondary | ICD-10-CM

## 2017-09-08 NOTE — Progress Notes (Signed)
HPI:  Acute visit for :  ? Yeast infection: -Symptoms started about 5 days ago -Symptoms include vulvovaginal pruritus and white vaginal discharge -Denies abdominal or pelvic pain, dysuria, fevers, new sexual partners or concern for sexually transmitted infections, bleeding  Past due for follow up - was no show for last visit:  Chronic medical problems summarized below were reviewed for changes.  Reports is been doing well.  No other new concerns today.  Has gained some weight since her last visit, not as [place] with lifestyle. Denies CP, SOB, DOE, treatment intolerance or new symptoms.  Due for flu vaccine and labs.  HTN: -meds norvasc 5, benazapril 30, hctz 25   HLD/Hx Hyperglycemia/Obesity: - wt 197.3  --> 195.5 last follow up 2/18 --> 192.5 --> 199 (2/19) -she is working on exercise - classes 3 days per week and healthier diet - reports is watching calories -advised starting statin several times, she finally agreed 03/2016 - upset her stomach  ROS: See pertinent positives and negatives per HPI.  Past Medical History:  Diagnosis Date  . Anemia    intermittent her whole life, reports she had eval and was told to take MV  . Eclampsia   . GERD (gastroesophageal reflux disease) 08/03/2015   resolved 2016  . Hyperglycemia 02/19/2013  . Hyperlipidemia 02/19/2013  . Hypertension   . Obesity 08/03/2015    Past Surgical History:  Procedure Laterality Date  . ABDOMINAL HYSTERECTOMY    . CHOLECYSTECTOMY  11/10/12   lap chole with IOC  . shoulder shoulder      Family History  Problem Relation Age of Onset  . Diabetes Mother   . Liver disease Father   . Alcohol abuse Father     Social History   Socioeconomic History  . Marital status: Married    Spouse name: None  . Number of children: None  . Years of education: None  . Highest education level: None  Social Needs  . Financial resource strain: None  . Food insecurity - worry: None  . Food insecurity - inability: None  .  Transportation needs - medical: None  . Transportation needs - non-medical: None  Occupational History  . None  Tobacco Use  . Smoking status: Former Research scientist (life sciences)  . Smokeless tobacco: Never Used  . Tobacco comment: quit in her 70s, light smoker  Substance and Sexual Activity  . Alcohol use: Yes    Alcohol/week: 1.8 oz    Types: 3 Glasses of wine per week    Comment: occ  . Drug use: No  . Sexual activity: Yes    Birth control/protection: Surgical    Comment: hysterectomy   Other Topics Concern  . 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  amLODipine (NORVASC) 5 MG tablet, Take 1 tablet (5 mg total) by mouth daily., Disp: 90 tablet, Rfl: 3 .  benazepril (LOTENSIN) 20 MG tablet, Take 1 tablet (20 mg total) by mouth daily., Disp: 90 tablet, Rfl: 3 .  hydrochlorothiazide (HYDRODIURIL) 25 MG tablet, Take 1 tablet (25 mg total) by mouth daily., Disp: 90 tablet, Rfl: 3 .  Multiple Vitamins-Minerals (MULTIVITAMIN WITH MINERALS) tablet, Take 1 tablet by mouth daily., Disp: ,  Rfl:   EXAM:  Vitals:   09/09/17 0904  BP: 128/80  Pulse: 67  Temp: (!) 97.5 F (36.4 C)    Body mass index is 37.14 kg/m.  GENERAL: vitals reviewed and listed above, alert, oriented, appears well hydrated and in no acute distress  HEENT: atraumatic, conjunttiva clear, no obvious abnormalities on inspection of external nose and ears  NECK: no obvious masses on inspection  LUNGS: clear to auscultation bilaterally, no wheezes, rales or rhonchi, [place] air movement  CV: HRRR, no peripheral edema  MS: moves all extremities without noticeable abnormality  GU: Mild vulvovaginal irritation, white thicker vaginal discharge  PSYCH: pleasant and cooperative, no obvious  depression or anxiety  ASSESSMENT AND PLAN:  Discussed the following assessment and plan:  Vaginal discharge  Hyperlipidemia, unspecified hyperlipidemia type  Essential hypertension  Hyperglycemia  -Likely yeast infection, see/chlamydia/trichomoniasis/BV/yeast testing pending; treat with over-the-counter yeast treatment in interim -Lifestyle recommendations, advised a healthy low sugar diet -Labs today as she is past due on follow-up -Follow-up 3-4 months -Patient advised to return or notify a doctor immediately if symptoms worsen or persist or new concerns arise.  Patient Instructions  BEFORE YOU LEAVE: -Labs -follow up: Follow-up 3-4 months, please come fasting so that we can check your cholesterol at that visit  Please eat a healthy, low sugar diet.  Please cut out sweets and simple starches such as white rice, pasta, breads, corn and potatoes.  Please use an over-the-counter yeast treatment.  We have ordered labs or studies at this visit. It can take up to 1-2 weeks for results and processing. IF results require follow up or explanation, we will call you with instructions. Clinically stable results will be released to your [facility]. If you have not heard from [place] or cannot find your results in [facility] in 2 weeks please contact our office at [phone].  If you are not yet signed up for [facility], please consider signing up.           Lucretia Kern, DO

## 2017-09-09 ENCOUNTER — Ambulatory Visit: Payer: 59 | Admitting: Family Medicine

## 2017-09-09 ENCOUNTER — Other Ambulatory Visit (HOSPITAL_COMMUNITY)
Admission: RE | Admit: 2017-09-09 | Discharge: 2017-09-09 | Disposition: A | Discharge status: Home / Self Care | Payer: 59 | Source: Ambulatory Visit | Attending: Family Medicine | Admitting: Family Medicine

## 2017-09-09 ENCOUNTER — Encounter: Payer: Self-pay | Admitting: Family Medicine

## 2017-09-09 VITALS — BP 128/80 | HR 67 | Temp 97.5°F | Ht 61.5 in | Wt 199.8 lb

## 2017-09-09 DIAGNOSIS — N898 Other specified noninflammatory disorders of vagina: Secondary | ICD-10-CM | POA: Insufficient documentation

## 2017-09-09 DIAGNOSIS — R739 Hyperglycemia, unspecified: Secondary | ICD-10-CM | POA: Diagnosis not present

## 2017-09-09 DIAGNOSIS — I10 Essential (primary) hypertension: Secondary | ICD-10-CM

## 2017-09-09 DIAGNOSIS — E785 Hyperlipidemia, unspecified: Secondary | ICD-10-CM | POA: Diagnosis not present

## 2017-09-09 LAB — BASIC METABOLIC PANEL
BUN: 12 mg/dL (ref 6–23)
CALCIUM: 9.7 mg/dL (ref 8.4–10.5)
CHLORIDE: 102 meq/L (ref 96–112)
CO2: 32 meq/L (ref 19–32)
Creatinine, Ser: 0.9 mg/dL (ref 0.40–1.20)
GFR: 81.9 mL/min (ref >60.00)
GLUCOSE: 172 mg/dL — AB (ref 70–99)
Potassium: 4.4 mEq/L (ref 3.5–5.1)
SODIUM: 138 meq/L (ref 135–145)

## 2017-09-09 LAB — CBC
HEMATOCRIT: 40 % (ref 36.0–46.0)
HEMOGLOBIN: 13.7 g/dL (ref 12.0–15.0)
MCHC: 34.3 g/dL (ref 30.0–36.0)
MCV: 95.4 fl (ref 78.0–100.0)
Platelets: 335 10*3/uL (ref 150.0–400.0)
RBC: 4.19 Mil/uL (ref 3.87–5.11)
RDW: 12.6 % (ref 11.5–15.5)
WBC: 4.1 10*3/uL (ref 4.0–10.5)

## 2017-09-09 LAB — HEMOGLOBIN A1C: Hgb A1c MFr Bld: 7.3 % — ABNORMAL HIGH (ref 4.6–6.5)

## 2017-09-09 NOTE — Addendum Note (Signed)
Addended by: Agnes Lawrence on: 09/09/2017 09:24 AM   Modules accepted: Orders

## 2017-09-09 NOTE — Patient Instructions (Signed)
BEFORE YOU LEAVE: -Labs -follow up: Follow-up 3-4 months, please come fasting so that we can check your cholesterol at that visit  Please eat a healthy, low sugar diet.  Please cut out sweets and simple starches such as white rice, pasta, breads, corn and potatoes.  Please use an over-the-counter yeast treatment.  We have ordered labs or studies at this visit. It can take up to 1-2 weeks for results and processing. IF results require follow up or explanation, we will call you with instructions. Clinically stable results will be released to your [facility]. If you have not heard from [place] or cannot find your results in [facility] in 2 weeks please contact our office at [phone].  If you are not yet signed up for [facility], please consider signing up.

## 2017-09-10 LAB — CERVICOVAGINAL ANCILLARY ONLY
Bacterial vaginitis: NEGATIVE
CANDIDA VAGINITIS: POSITIVE — AB
CHLAMYDIA, DNA PROBE: NEGATIVE
NEISSERIA GONORRHEA: NEGATIVE
TRICH (WINDOWPATH): NEGATIVE

## 2017-10-02 ENCOUNTER — Encounter: Payer: Self-pay | Admitting: Family Medicine

## 2017-10-02 ENCOUNTER — Ambulatory Visit: Payer: 59 | Admitting: Family Medicine

## 2017-10-02 VITALS — BP 102/80 | HR 81 | Temp 97.5°F | Ht 61.5 in | Wt 199.3 lb

## 2017-10-02 DIAGNOSIS — E1169 Type 2 diabetes mellitus with other specified complication: Secondary | ICD-10-CM | POA: Insufficient documentation

## 2017-10-02 DIAGNOSIS — E119 Type 2 diabetes mellitus without complications: Secondary | ICD-10-CM | POA: Insufficient documentation

## 2017-10-02 DIAGNOSIS — E785 Hyperlipidemia, unspecified: Secondary | ICD-10-CM

## 2017-10-02 MED ORDER — ROSUVASTATIN CALCIUM 5 MG PO TABS: 5.0000 mg | ORAL_TABLET | Freq: Every day | ORAL | 3 refills | Status: DC

## 2017-10-02 NOTE — Progress Notes (Signed)
HPI:  Using dictation device. Unfortunately this device frequently misinterprets words/phrases.  Acute visit for new diagnosis of Diabetes: -Hgba1c is 7.3 -LDL elevated in the past (due for cholesterol check) - Reports she feels fine, no fatigue, polyuria or vision changes -Reports she has a lot of room for improvement in terms of eating a healthy diet and getting regular exercise and she would prefer to do this rather than taking medications  ROS: See pertinent positives and negatives per HPI.  Past Medical History:  Diagnosis Date  . Anemia    intermittent her whole life, reports she had eval and was told to take MV  . Eclampsia   . GERD (gastroesophageal reflux disease) 08/03/2015   resolved 2016  . Hyperglycemia 02/19/2013  . Hyperlipidemia 02/19/2013  . Hypertension   . Obesity 08/03/2015    Past Surgical History:  Procedure Laterality Date  . ABDOMINAL HYSTERECTOMY    . CHOLECYSTECTOMY  11/10/12   lap chole with IOC  . shoulder shoulder      Family History  Problem Relation Age of Onset  . Diabetes Mother   . Liver disease Father   . Alcohol abuse Father     SOCIAL HX: No reported change   Current Outpatient Medications:  .  amLODipine (NORVASC) 5 MG tablet, Take 1 tablet (5 mg total) by mouth daily., Disp: 90 tablet, Rfl: 3 .  benazepril (LOTENSIN) 20 MG tablet, Take 1 tablet (20 mg total) by mouth daily., Disp: 90 tablet, Rfl: 3 .  hydrochlorothiazide (HYDRODIURIL) 25 MG tablet, Take 1 tablet (25 mg total) by mouth daily., Disp: 90 tablet, Rfl: 3 .  Multiple Vitamins-Minerals (MULTIVITAMIN WITH MINERALS) tablet, Take 1 tablet by mouth daily., Disp: , Rfl:   EXAM:  Vitals:   10/02/17 1613  BP: 102/80  Pulse: 81  Temp: (!) 97.5 F (36.4 C)    Body mass index is 37.05 kg/m.  GENERAL: vitals reviewed and listed above, alert, oriented, appears well hydrated and in no acute distress  HEENT: atraumatic, conjunttiva clear, no obvious abnormalities on  inspection of external nose and ears  NECK: no obvious masses on inspection  LUNGS: clear to auscultation bilaterally, no wheezes, rales or rhonchi, Kidd air movement  CV: HRRR, no peripheral edema  MS: moves all extremities without noticeable abnormality  PSYCH: pleasant and cooperative, no obvious depression or anxiety  ASSESSMENT AND PLAN:  Discussed the following assessment and plan:  Type 2 diabetes mellitus without complication, without long-term current use of insulin (HCC)  Hyperlipidemia associated with type 2 diabetes mellitus (HCC)  BMI 37  -Reviewed lab results with her -Had a lengthy discussion regarding the implications, potential complications, management, treatment options and risk with diabetes -We also discussed risks benefits of statins -She prefers to treat without medications as much as possible, but is willing to start a statin -We discussed lifestyle changes at length, recommended a healthy low sugar Mediterranean-style diet, wt reduction and regular aerobic exercise -advised eye exam, foot exam -Offered formal help with the diet, but she feels she can do this on her own -Follow-up in 3 months and we will recheck labs then sooner if any concerns,   Patient Instructions  BEFORE YOU LEAVE: -follow up: In 3 months  Start the Crestor and take once daily.   We recommend the following healthy lifestyle for LIFE: 1) Small portions. But, make sure to get regular (at least 3 per day), healthy meals and small healthy snacks if needed.  2) Eat a healthy clean  diet.   TRY TO EAT: -at least 5-7 servings of low sugar, colorful, and nutrient rich vegetables per day (not corn, potatoes or bananas.) -berries are the best choice if you wish to eat fruit (only eat small amounts if trying to reduce weight)  -lean meets (fish, white meat of chicken or [place]) -vegan proteins for some meals - beans or tofu, whole grains, nuts and seeds -Replace bad fats with Rohr fats  - Romaniello fats include: fish, nuts and seeds, canola oil, olive oil -small amounts of low fat or non fat dairy -small amounts of100 % whole grains - check the lables -drink plenty of water  AVOID: -SUGAR, sweets, anything with added sugar, corn syrup or sweeteners - must read labels as even foods advertised as "healthy" often are loaded with sugar -if you must have a sweetener, small amounts of stevia may be best -sweetened beverages and artificially sweetened beverages -simple starches (rice, bread, potatoes, pasta, chips, etc - small amounts of 100% whole grains are ok) -red meat, pork, butter -fried foods, fast food, processed food, excessive dairy, eggs and coconut.  3)Get at least 150 minutes of sweaty aerobic exercise per week.  4)Reduce stress - consider counseling, meditation and relaxation to balance other aspects of your life.     Erica Kern, DO

## 2017-10-02 NOTE — Patient Instructions (Signed)
BEFORE YOU LEAVE: -follow up: In 3 months  Start the Crestor and take once daily.   We recommend the following healthy lifestyle for LIFE: 1) Small portions. But, make sure to get regular (at least 3 per day), healthy meals and small healthy snacks if needed.  2) Eat a healthy clean diet.   TRY TO EAT: -at least 5-7 servings of low sugar, colorful, and nutrient rich vegetables per day (not corn, potatoes or bananas.) -berries are the best choice if you wish to eat fruit (only eat small amounts if trying to reduce weight)  -lean meets (fish, white meat of chicken or [place]) -vegan proteins for some meals - beans or tofu, whole grains, nuts and seeds -Replace bad fats with Mcdonnell fats - Collison fats include: fish, nuts and seeds, canola oil, olive oil -small amounts of low fat or non fat dairy -small amounts of100 % whole grains - check the lables -drink plenty of water  AVOID: -SUGAR, sweets, anything with added sugar, corn syrup or sweeteners - must read labels as even foods advertised as "healthy" often are loaded with sugar -if you must have a sweetener, small amounts of stevia may be best -sweetened beverages and artificially sweetened beverages -simple starches (rice, bread, potatoes, pasta, chips, etc - small amounts of 100% whole grains are ok) -red meat, pork, butter -fried foods, fast food, processed food, excessive dairy, eggs and coconut.  3)Get at least 150 minutes of sweaty aerobic exercise per week.  4)Reduce stress - consider counseling, meditation and relaxation to balance other aspects of your life.

## 2017-10-13 ENCOUNTER — Encounter: Payer: Self-pay | Admitting: Internal Medicine

## 2017-11-25 DIAGNOSIS — N898 Other specified noninflammatory disorders of vagina: Secondary | ICD-10-CM | POA: Diagnosis not present

## 2017-11-25 DIAGNOSIS — Z01411 Encounter for gynecological examination (general) (routine) with abnormal findings: Secondary | ICD-10-CM | POA: Diagnosis not present

## 2017-11-28 ENCOUNTER — Ambulatory Visit: Payer: 59 | Admitting: Family Medicine

## 2017-11-28 ENCOUNTER — Encounter: Payer: Self-pay | Admitting: Family Medicine

## 2017-11-28 VITALS — BP 126/82 | HR 82 | Temp 98.5°F | Resp 12 | Ht 61.5 in | Wt 193.4 lb

## 2017-11-28 DIAGNOSIS — S86912A Strain of unspecified muscle(s) and tendon(s) at lower leg level, left leg, initial encounter: Secondary | ICD-10-CM

## 2017-11-28 MED ORDER — CYCLOBENZAPRINE HCL 10 MG PO TABS: 10.0000 mg | ORAL_TABLET | Freq: Every day | ORAL | 0 refills | Status: AC

## 2017-11-28 MED ORDER — KETOROLAC TROMETHAMINE 60 MG/2ML IM SOLN
60.0000 mg | Freq: Once | INTRAMUSCULAR | Status: AC
  Administered 2017-11-28: 60 mg via INTRAMUSCULAR

## 2017-11-28 NOTE — Progress Notes (Signed)
ACUTE VISIT   HPI:  Chief Complaint  Patient presents with  . Fall    fell this morning, hurt leg, possible pulled muscle in left leg    Erica Sutton is a 62 y.o. female, who is here today complaining of left lower extremity pain that started this morning. Today around 7:30 Am she was holing her 46 Lb dog and reaching to close door, when she was doing so she developed sudden pain on posterior aspect of thigh down foot. Because pain she fell,slowly went down her knees.  Pain starts under her left buttocks and radiates down to left foot, "a little bit of "tingling sensation on posterior aspect of the thigh. No prior history of similar pain, no back pain,no saddle anesthesia,or urine/bowel incontinence.  She has not taken anything for pain. Pain is constant, sharp/dull/achy,7/10, and stable. Exacerbated by movement,walking,and standing up. Alleviated by rest.    Review of Systems  Constitutional: Positive for activity change. Negative for chills and fever.  Gastrointestinal: Negative for abdominal pain, nausea and vomiting.  Genitourinary: Negative for decreased urine volume, dysuria and hematuria.  Musculoskeletal: Positive for gait problem and myalgias. Negative for back pain.  Skin: Negative for rash and wound.  Neurological: Negative for weakness and numbness.      Current Outpatient Medications on File Prior to Visit  Medication Sig Dispense Refill  . amLODipine (NORVASC) 5 MG tablet Take 1 tablet (5 mg total) by mouth daily. 90 tablet 3  . benazepril (LOTENSIN) 20 MG tablet Take 1 tablet (20 mg total) by mouth daily. 90 tablet 3  . hydrochlorothiazide (HYDRODIURIL) 25 MG tablet Take 1 tablet (25 mg total) by mouth daily. 90 tablet 3  . Multiple Vitamins-Minerals (MULTIVITAMIN WITH MINERALS) tablet Take 1 tablet by mouth daily.    . rosuvastatin (CRESTOR) 5 MG tablet Take 1 tablet (5 mg total) by mouth daily. 90 tablet 3   No current facility-administered  medications on file prior to visit.      Past Medical History:  Diagnosis Date  . Anemia    intermittent her whole life, reports she had eval and was told to take MV  . Eclampsia   . GERD (gastroesophageal reflux disease) 08/03/2015   resolved 2016  . Hyperglycemia 02/19/2013  . Hyperlipidemia 02/19/2013  . Hypertension   . Obesity 08/03/2015   No Known Allergies  Social History   Socioeconomic History  . Marital status: Married    Spouse name: Not on file  . Number of children: Not on file  . Years of education: Not on file  . Highest education level: Not on file  Occupational History  . Not on file  Social Needs  . Financial resource strain: Not on file  . Food insecurity:    Worry: Not on file    Inability: Not on file  . Transportation needs:    Medical: Not on file    Non-medical: Not on file  Tobacco Use  . Smoking status: Former Research scientist (life sciences)  . Smokeless tobacco: Never Used  . Tobacco comment: quit in her 44s, light smoker  Substance and Sexual Activity  . Alcohol use: Yes    Alcohol/week: 1.8 oz    Types: 3 Glasses of wine per week    Comment: occ  . Drug use: No  . Sexual activity: Yes    Birth control/protection: Surgical    Comment: hysterectomy   Lifestyle  . Physical activity:    Days per week: Not on  file    Minutes per session: Not on file  . Stress: Not on file  Relationships  . Social connections:    Talks on phone: Not on file    Gets together: Not on file    Attends religious service: Not on file    Active member of club or organization: Not on file    Attends meetings of clubs or organizations: Not on file    Relationship status: Not on file  Other Topics Concern  . 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  Physical Exam  Nursing note and vitals reviewed. Constitutional: She is oriented to person, place, and time. She appears well-developed. She does not appear ill. No distress.  HENT:  Head: Normocephalic and atraumatic.  Eyes: Conjunctivae are normal.  Cardiovascular: Normal rate and regular rhythm.  Pulses:      Dorsalis pedis pulses are 2+ on the right side, and 2+ on the left side.       Posterior tibial pulses are 2+ on the left side.  No calf tenderness.  Respiratory: Effort normal and breath sounds normal. No respiratory distress.  GI: Soft. She exhibits no mass. There is no tenderness.  Musculoskeletal: She exhibits no edema.       Lumbar back: She exhibits no tenderness and no bony tenderness.       Left upper leg: She exhibits no tenderness and no deformity.  Pain elicited at the end of full hip flexion and internal rotation. Normal range of motion. Antalgic gait.   Neurological: She is alert and oriented to person, place, and time. She has normal strength. Coordination normal.  SLR negative.  Skin: Skin is warm. No rash noted. No erythema.  Psychiatric: She has a normal mood and affect. Her speech is normal.  Well groomed, Ma[place]eri eye contact.    ASSESSMENT AND PLAN:   [name] was seen today for fall.  Diagnoses and all orders for this visit:  Muscle strain of left lower leg, initial encounter -     cyclobenzaprine (FLEXERIL) 10 MG tablet; Take 1 tablet (10 mg total) by mouth at bedtime for 15 days. -     ketorolac (TORADOL) injection 60 mg   After verbal consent she received Toradol 60 mg IM. She can continue OTC Aleve 220 mg bid for 5-7 days,monitor BP while doing so. OTC Asper cream or icy hot with Lidocaine may also help. Local ice. Side effects of Flexeril discussed. I do not think imaging is needed today. F/U as needed.    -Ms.Erica F  Sutton was advised to seek immediate medical attention if sudden worsening symptoms.     Betty G. Martinique, MD  [facility]. [place] office.

## 2017-11-28 NOTE — Patient Instructions (Addendum)
  [name] I have seen you today for an acute visit.  A few things to remember from today's visit:   Muscle strain of left lower leg, initial encounter - Plan: cyclobenzaprine (FLEXERIL) 10 MG tablet   Medications prescribed today are intended for short period of time and will not be refill upon request, a follow up appointment might be necessary to discuss continuation of of treatment if appropriate.  Take Flexeril at bedtime, it causes drowsiness. Over-the-counter IcyHot or Aspercreme with lidocaine on the affected area also might help. Local ice. Stretching exercises. Aleve 220 mg twice daily for 5 to 7 days, monitoring blood pressure during this time.    In general please monitor for signs of worsening symptoms and seek immediate medical attention if any concerning.  If symptoms are not resolved in a few days/weeks you should schedule a follow up appointment with your doctor, before if needed.  I hope you get better soon!

## 2018-01-05 NOTE — Progress Notes (Signed)
HPI:  Using dictation device. Unfortunately this device frequently misinterprets words/phrases.  Here for CPE and follow up:  -Concerns and/or follow up today:   PMH Hyperlipidemia, HTN, diabetes and obesity. History of poor compliance.  Refused statin for a long time, then tried one, but it upset her stomach. Then was willing to try low dose statin last visit. On triple therapy for the HTN. She wanted to try lifestyle changes for diabetes at her last visit. Today reports she is doing well and is without complaints. Has biometric form for insurance she plans to send to [place] for completion. Reports diet is "pretty [place]" and is trying to walk on a regular basis. Sees ROS - negative.  -Taking folic acid, vitamin D or calcium: no -Diabetes and Dyslipidemia Screening: fasting for labs -Vaccines: see vaccine section Epic - agrees to pneumococcal vaccine -pap history: reports done with Dr. Raphael Gibney in gyn 11/2017 -FDLMP: see nursing notes -wants STI testing (Hep C if born 61-65): no - reports done with gyn -FH breast, colon or ovarian ca: see FH Last mammogram: reports sees gyn; 08/2017 birads 1 Last colon cancer screening: 04/2007 colonosocpy, cologuard normal 04/2017 Breast Ca Risk Assessment: see family history and pt history DEXA (>/= 65): n/a  -Alcohol, Tobacco, drug use: see social history  Review of Systems - no fevers, unintentional weight loss, vision loss, hearing loss, chest pain, sob, hemoptysis, melena, hematochezia, hematuria, genital discharge, changing or concerning skin lesions, bleeding, bruising, loc, thoughts of self harm or SI  Past Medical History:  Diagnosis Date  . Anemia    intermittent her whole life, reports she had eval and was told to take MV  . Eclampsia   . GERD (gastroesophageal reflux disease) 08/03/2015   resolved 2016  . Hyperglycemia 02/19/2013  . Hyperlipidemia 02/19/2013  . Hypertension   . Obesity 08/03/2015    Past Surgical History:  Procedure  Laterality Date  . ABDOMINAL HYSTERECTOMY    . CHOLECYSTECTOMY  11/10/12   lap chole with IOC  . shoulder shoulder      Family History  Problem Relation Age of Onset  . Diabetes Mother   . Liver disease Father   . Alcohol abuse Father     Social History   Socioeconomic History  . Marital status: Married    Spouse name: Not on file  . Number of children: Not on file  . Years of education: Not on file  . Highest education level: Not on file  Occupational History  . Not on file  Social Needs  . Financial resource strain: Not on file  . Food insecurity:    Worry: Not on file    Inability: Not on file  . Transportation needs:    Medical: Not on file    Non-medical: Not on file  Tobacco Use  . Smoking status: Former Research scientist (life sciences)  . Smokeless tobacco: Never Used  . Tobacco comment: quit in her 36s, light smoker  Substance and Sexual Activity  . Alcohol use: Yes    Alcohol/week: 1.8 oz    Types: 3 Glasses of wine per week    Comment: occ  . Drug use: No  . Sexual activity: Yes    Birth control/protection: Surgical    Comment: hysterectomy   Lifestyle  . Physical activity:    Days per week: Not on file    Minutes per session: Not on file  . Stress: Not on file  Relationships  . Social connections:    Talks on phone: Not  on file    Gets together: Not on file    Attends religious service: Not on file    Active member of club or organization: Not on file    Attends meetings of clubs or organizations: Not on file    Relationship status: Not on file  Other Topics Concern  . 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  amLODipine (NORVASC) 5 MG tablet, Take 1 tablet (5 mg total) by mouth daily., Disp: 90 tablet, Rfl: 3 .   benazepril (LOTENSIN) 20 MG tablet, Take 1 tablet (20 mg total) by mouth daily., Disp: 90 tablet, Rfl: 3 .  hydrochlorothiazide (HYDRODIURIL) 25 MG tablet, Take 1 tablet (25 mg total) by mouth daily., Disp: 90 tablet, Rfl: 3 .  Multiple Vitamins-Minerals (MULTIVITAMIN WITH MINERALS) tablet, Take 1 tablet by mouth daily., Disp: , Rfl:  .  rosuvastatin (CRESTOR) 5 MG tablet, Take 1 tablet (5 mg total) by mouth daily., Disp: 90 tablet, Rfl: 3  EXAM:  Vitals:   01/06/18 0820  BP: 122/80  Pulse: 68  Temp: 97.9 F (36.6 C)  SpO2: 96%   Body mass index is 35.53 kg/m.  GENERAL: vitals reviewed and listed below, alert, oriented, appears well hydrated and in no acute distress  HEENT: head atraumatic, PERRLA, normal appearance of eyes, ears, nose and mouth. moist mucus membranes.  NECK: supple, no masses or lymphadenopathy  LUNGS: clear to auscultation bilaterally, no rales, rhonchi or wheeze  CV: HRRR, no peripheral edema or cyanosis, normal pedal pulses  ABDOMEN: bowel sounds normal, soft, non tender to palpation, no masses, no rebound or guarding  GU/BREAST: declined, does with gyn  SKIN: no rash or abnormal lesions  MS: normal gait, moves all extremities normally  NEURO: normal gait, speech and thought processing grossly intact, muscle tone grossly intact throughout  PSYCH: normal affect, pleasant and cooperative  Foot exam done  ASSESSMENT AND PLAN:  Discussed the following assessment and plan:  PREVENTIVE EXAM: -Discussed and advised all [place] preventive services health task force level A and B recommendations for age, sex and risks. -Advised at least 150 minutes of exercise per week and a healthy diet with avoidance of (less then 1 serving per week) processed foods, white starches, red meat, fast foods and sweets and consisting of: * 5-9 servings of fresh fruits and vegetables (not corn or potatoes) *nuts and seeds, beans *olives and olive oil *lean meats such as fish and  white chicken  *whole grains -labs, studies and vaccines per orders this encounter -advised assistant to obtain and abstract pap done with gyn  2. Type 2 diabetes mellitus without complication, without long-term current use of insulin (HCC) - Hemoglobin A1c - Ambulatory referral to Podiatry -lifestyle recs -advised regular eye and foot checks  3. Hyperlipidemia associated with type 2 diabetes mellitus ([place]) - Lipid panel -lifestyle recs -cont tx  4. Essential hypertension -cont meds -lifestyle recs - Basic metabolic panel - CBC  5. Morbid obesity ([place]) -lifestyle recs  6. Tinea pedis of both feet -discussed tx options - she prefers to try topical - Ambulatory referral to Podiatry  7. Onychomycosis -discussed tx options, prefers to try topical -  Ambulatory referral to Podiatry   Patient advised to return to clinic immediately if symptoms worsen or persist or new concerns.  Patient Instructions  BEFORE YOU LEAVE: -pneumococcal 23 vaccine -labs -obtain and abstract pap smear from gyn (Dr. Raphael Gibney) -follow up: 3-4 months  Get a regular Diabetic eye exam.  Treat the fungus on your feet as we discussed and check your feet every night.  I sent a referral for podiatry.  We have ordered labs or studies at this visit. It can take up to 1-2 weeks for results and processing. IF results require follow up or explanation, we will call you with instructions. Clinically stable results will be released to your [facility]. If you have not heard from [place] or cannot find your results in [facility] in 2 weeks please contact our office at [phone].  If you are not yet signed up for [facility], please consider signing up.   We recommend the following healthy lifestyle for LIFE: 1) Small portions. But, make sure to get regular (at least 3 per day), healthy meals and small healthy snacks if needed.  2) Eat a healthy clean diet.   TRY TO EAT: -at least 5-7 servings of low sugar, colorful,  and nutrient rich vegetables per day (not corn, potatoes or bananas.) -berries are the best choice if you wish to eat fruit (only eat small amounts if trying to reduce weight)  -lean meets (fish, white meat of chicken or [place]) -vegan proteins for some meals - beans or tofu, whole grains, nuts and seeds -Replace bad fats with [place] fats - [place] fats include: fish, nuts and seeds, canola oil, olive oil -small amounts of low fat or non fat dairy -small amounts of100 % whole grains - check the lables -drink plenty of water  AVOID: -SUGAR, sweets, anything with added sugar, corn syrup or sweeteners - must read labels as even foods advertised as "healthy" often are loaded with sugar -if you must have a sweetener, small amounts of stevia may be best -sweetened beverages and artificially sweetened beverages -simple starches (rice, bread, potatoes, pasta, chips, etc - small amounts of 100% whole grains are ok) -red meat, pork, butter -fried foods, fast food, processed food, excessive dairy, eggs and coconut.  3)Get at least 150 minutes of sweaty aerobic exercise per week.  4)Reduce stress - consider counseling, meditation and relaxation to balance other aspects of your life.   Preventive Care 40-64 Years, Female Preventive care refers to lifestyle choices and visits with your health care provider that can promote health and wellness. What does preventive care include?  A yearly physical exam. This is also called an annual well check.  Dental exams once or twice a year.  Routine eye exams. Ask your health care provider how often you should have your eyes checked.  Personal lifestyle choices, including: ? Daily care of your teeth and gums. ? Regular physical activity. ? Eating a healthy diet. ? Avoiding tobacco and drug use. ? Limiting alcohol use. ? Practicing safe sex. ? Taking low-dose aspirin daily starting at age 23. ? Taking vitamin and mineral supplements as recommended by your  health care provider. What happens during an annual well check? The services and screenings done by your health care provider during your annual well check will depend on your age, overall health, lifestyle risk factors, and family history of disease. Counseling Your health care provider may ask you questions about your:  Alcohol use.  Tobacco use.  Drug use.  Emotional well-being.  Home  and relationship well-being.  Sexual activity.  Eating habits.  Work and work Statistician.  Method of birth control.  Menstrual cycle.  Pregnancy history.  Screening You may have the following tests or measurements:  Height, weight, and BMI.  Blood pressure.  Lipid and cholesterol levels. These may be checked every 5 years, or more frequently if you are over 76 years old.  Skin check.  Lung cancer screening. You may have this screening every year starting at age 49 if you have a 30-pack-year history of smoking and currently smoke or have quit within the past 15 years.  Fecal occult blood test (FOBT) of the stool. You may have this test every year starting at age 26.  Flexible sigmoidoscopy or colonoscopy. You may have a sigmoidoscopy every 5 years or a colonoscopy every 10 years starting at age 48.  Hepatitis C blood test.  Hepatitis B blood test.  Sexually transmitted disease (STD) testing.  Diabetes screening. This is done by checking your blood sugar (glucose) after you have not eaten for a while (fasting). You may have this done every 1-3 years.  Mammogram. This may be done every 1-2 years. Talk to your health care provider about when you should start having regular mammograms. This may depend on whether you have a family history of breast cancer.  BRCA-related cancer screening. This may be done if you have a family history of breast, ovarian, tubal, or peritoneal cancers.  Pelvic exam and Pap test. This may be done every 3 years starting at age 42. Starting at age 30,  this may be done every 5 years if you have a Pap test in combination with an HPV test.  Bone density scan. This is done to screen for osteoporosis. You may have this scan if you are at high risk for osteoporosis.  Discuss your test results, treatment options, and if necessary, the need for more tests with your health care provider. Vaccines Your health care provider may recommend certain vaccines, such as:  Influenza vaccine. This is recommended every year.  Tetanus, diphtheria, and acellular pertussis (Tdap, Td) vaccine. You may need a Td booster every 10 years.  Varicella vaccine. You may need this if you have not been vaccinated.  Zoster vaccine. You may need this after age 63.  Measles, mumps, and rubella (MMR) vaccine. You may need at least one dose of MMR if you were born in 1957 or later. You may also need a second dose.  Pneumococcal 13-valent conjugate (PCV13) vaccine. You may need this if you have certain conditions and were not previously vaccinated.  Pneumococcal polysaccharide (PPSV23) vaccine. You may need one or two doses if you smoke cigarettes or if you have certain conditions.  Meningococcal vaccine. You may need this if you have certain conditions.  Hepatitis A vaccine. You may need this if you have certain conditions or if you travel or work in places where you may be exposed to hepatitis A.  Hepatitis B vaccine. You may need this if you have certain conditions or if you travel or work in places where you may be exposed to hepatitis B.  Haemophilus influenzae type b (Hib) vaccine. You may need this if you have certain conditions.  Talk to your health care provider about which screenings and vaccines you need and how often you need them. This information is not intended to replace advice given to you by your health care provider. Make sure you discuss any questions you have with your health care provider.  Document Released: 08/04/2015 Document Revised: 03/27/2016  Document Reviewed: 05/09/2015 Elsevier Interactive Patient Education  2018 [employer].          No follow-ups on file.  Lucretia Kern, DO

## 2018-01-06 ENCOUNTER — Ambulatory Visit (INDEPENDENT_AMBULATORY_CARE_PROVIDER_SITE_OTHER): Payer: 59 | Admitting: Family Medicine

## 2018-01-06 ENCOUNTER — Ambulatory Visit: Payer: Self-pay

## 2018-01-06 VITALS — BP 122/80 | HR 68 | Temp 97.9°F | Ht 61.2 in | Wt 189.3 lb

## 2018-01-06 DIAGNOSIS — Z0000 Encounter for general adult medical examination without abnormal findings: Secondary | ICD-10-CM | POA: Diagnosis not present

## 2018-01-06 DIAGNOSIS — B351 Tinea unguium: Secondary | ICD-10-CM

## 2018-01-06 DIAGNOSIS — Z23 Encounter for immunization: Secondary | ICD-10-CM

## 2018-01-06 DIAGNOSIS — I10 Essential (primary) hypertension: Secondary | ICD-10-CM

## 2018-01-06 DIAGNOSIS — E119 Type 2 diabetes mellitus without complications: Secondary | ICD-10-CM

## 2018-01-06 DIAGNOSIS — E785 Hyperlipidemia, unspecified: Secondary | ICD-10-CM | POA: Diagnosis not present

## 2018-01-06 DIAGNOSIS — E1169 Type 2 diabetes mellitus with other specified complication: Secondary | ICD-10-CM

## 2018-01-06 DIAGNOSIS — B353 Tinea pedis: Secondary | ICD-10-CM | POA: Diagnosis not present

## 2018-01-06 LAB — LIPID PANEL
CHOLESTEROL: 227 mg/dL — AB (ref 0–200)
HDL: 52.2 mg/dL (ref >39.00)
LDL Cholesterol: 136 mg/dL — ABNORMAL HIGH (ref 0–99)
NONHDL: 174.75
Total CHOL/HDL Ratio: 4
Triglycerides: 195 mg/dL — ABNORMAL HIGH (ref 0.0–149.0)
VLDL: 39 mg/dL (ref 0.0–40.0)

## 2018-01-06 LAB — BASIC METABOLIC PANEL
BUN: 14 mg/dL (ref 6–23)
CALCIUM: 9.8 mg/dL (ref 8.4–10.5)
CHLORIDE: 100 meq/L (ref 96–112)
CO2: 32 meq/L (ref 19–32)
CREATININE: 0.95 mg/dL (ref 0.40–1.20)
GFR: 76.87 mL/min (ref >60.00)
Glucose, Bld: 142 mg/dL — ABNORMAL HIGH (ref 70–99)
Potassium: 4.1 mEq/L (ref 3.5–5.1)
Sodium: 139 mEq/L (ref 135–145)

## 2018-01-06 LAB — CBC
HEMATOCRIT: 40.7 % (ref 36.0–46.0)
Hemoglobin: 14 g/dL (ref 12.0–15.0)
MCHC: 34.3 g/dL (ref 30.0–36.0)
MCV: 95.3 fl (ref 78.0–100.0)
Platelets: 338 10*3/uL (ref 150.0–400.0)
RBC: 4.27 Mil/uL (ref 3.87–5.11)
RDW: 12.6 % (ref 11.5–15.5)
WBC: 4.1 10*3/uL (ref 4.0–10.5)

## 2018-01-06 LAB — HEMOGLOBIN A1C: HEMOGLOBIN A1C: 6.1 % (ref 4.6–6.5)

## 2018-01-06 NOTE — Addendum Note (Signed)
Addended by: Elmer Picker on: 01/06/2018 09:03 AM   Modules accepted: Orders

## 2018-01-06 NOTE — Telephone Encounter (Signed)
  Reason for Disposition . [1] Other NON-URGENT information for PCP AND [2] does not require PCP response  Answer Assessment - Initial Assessment Questions 1. REASON FOR CALL or QUESTION: "What is your reason for calling today?" or "How can I best help you?" or "What question do you have that I can help answer?"     Would like  Lab results 2. CALLER: Document the source of call. (e.g., laboratory, patient).     lab results from lipid panel.  Protocols used: PCP CALL - NO TRIAGE-A-AH

## 2018-01-06 NOTE — Patient Instructions (Signed)
BEFORE YOU LEAVE: -pneumococcal 23 vaccine -labs -obtain and abstract pap smear from gyn (Dr. Raphael Gibney) -follow up: 3-4 months  Get a regular Diabetic eye exam.  Treat the fungus on your feet as we discussed and check your feet every night.  I sent a referral for podiatry.  We have ordered labs or studies at this visit. It can take up to 1-2 weeks for results and processing. IF results require follow up or explanation, we will call you with instructions. Clinically stable results will be released to your [facility]. If you have not heard from [place] or cannot find your results in [facility] in 2 weeks please contact our office at [phone].  If you are not yet signed up for [facility], please consider signing up.   We recommend the following healthy lifestyle for LIFE: 1) Small portions. But, make sure to get regular (at least 3 per day), healthy meals and small healthy snacks if needed.  2) Eat a healthy clean diet.   TRY TO EAT: -at least 5-7 servings of low sugar, colorful, and nutrient rich vegetables per day (not corn, potatoes or bananas.) -berries are the best choice if you wish to eat fruit (only eat small amounts if trying to reduce weight)  -lean meets (fish, white meat of chicken or [place]) -vegan proteins for some meals - beans or tofu, whole grains, nuts and seeds -Replace bad fats with [place] fats - [place] fats include: fish, nuts and seeds, canola oil, olive oil -small amounts of low fat or non fat dairy -small amounts of100 % whole grains - check the lables -drink plenty of water  AVOID: -SUGAR, sweets, anything with added sugar, corn syrup or sweeteners - must read labels as even foods advertised as "healthy" often are loaded with sugar -if you must have a sweetener, small amounts of stevia may be best -sweetened beverages and artificially sweetened beverages -simple starches (rice, bread, potatoes, pasta, chips, etc - small amounts of 100% whole grains are ok) -red meat, pork,  butter -fried foods, fast food, processed food, excessive dairy, eggs and coconut.  3)Get at least 150 minutes of sweaty aerobic exercise per week.  4)Reduce stress - consider counseling, meditation and relaxation to balance other aspects of your life.   Preventive Care 40-64 Years, Female Preventive care refers to lifestyle choices and visits with your health care provider that can promote health and wellness. What does preventive care include?  A yearly physical exam. This is also called an annual well check.  Dental exams once or twice a year.  Routine eye exams. Ask your health care provider how often you should have your eyes checked.  Personal lifestyle choices, including: ? Daily care of your teeth and gums. ? Regular physical activity. ? Eating a healthy diet. ? Avoiding tobacco and drug use. ? Limiting alcohol use. ? Practicing safe sex. ? Taking low-dose aspirin daily starting at age 7. ? Taking vitamin and mineral supplements as recommended by your health care provider. What happens during an annual well check? The services and screenings done by your health care provider during your annual well check will depend on your age, overall health, lifestyle risk factors, and family history of disease. Counseling Your health care provider may ask you questions about your:  Alcohol use.  Tobacco use.  Drug use.  Emotional well-being.  Home and relationship well-being.  Sexual activity.  Eating habits.  Work and work Statistician.  Method of birth control.  Menstrual cycle.  Pregnancy history.  Screening You may have the following tests or measurements:  Height, weight, and BMI.  Blood pressure.  Lipid and cholesterol levels. These may be checked every 5 years, or more frequently if you are over 21 years old.  Skin check.  Lung cancer screening. You may have this screening every year starting at age 20 if you have a 30-pack-year history of smoking  and currently smoke or have quit within the past 15 years.  Fecal occult blood test (FOBT) of the stool. You may have this test every year starting at age 76.  Flexible sigmoidoscopy or colonoscopy. You may have a sigmoidoscopy every 5 years or a colonoscopy every 10 years starting at age 68.  Hepatitis C blood test.  Hepatitis B blood test.  Sexually transmitted disease (STD) testing.  Diabetes screening. This is done by checking your blood sugar (glucose) after you have not eaten for a while (fasting). You may have this done every 1-3 years.  Mammogram. This may be done every 1-2 years. Talk to your health care provider about when you should start having regular mammograms. This may depend on whether you have a family history of breast cancer.  BRCA-related cancer screening. This may be done if you have a family history of breast, ovarian, tubal, or peritoneal cancers.  Pelvic exam and Pap test. This may be done every 3 years starting at age 75. Starting at age 46, this may be done every 5 years if you have a Pap test in combination with an HPV test.  Bone density scan. This is done to screen for osteoporosis. You may have this scan if you are at high risk for osteoporosis.  Discuss your test results, treatment options, and if necessary, the need for more tests with your health care provider. Vaccines Your health care provider may recommend certain vaccines, such as:  Influenza vaccine. This is recommended every year.  Tetanus, diphtheria, and acellular pertussis (Tdap, Td) vaccine. You may need a Td booster every 10 years.  Varicella vaccine. You may need this if you have not been vaccinated.  Zoster vaccine. You may need this after age 18.  Measles, mumps, and rubella (MMR) vaccine. You may need at least one dose of MMR if you were born in 1957 or later. You may also need a second dose.  Pneumococcal 13-valent conjugate (PCV13) vaccine. You may need this if you have certain  conditions and were not previously vaccinated.  Pneumococcal polysaccharide (PPSV23) vaccine. You may need one or two doses if you smoke cigarettes or if you have certain conditions.  Meningococcal vaccine. You may need this if you have certain conditions.  Hepatitis A vaccine. You may need this if you have certain conditions or if you travel or work in places where you may be exposed to hepatitis A.  Hepatitis B vaccine. You may need this if you have certain conditions or if you travel or work in places where you may be exposed to hepatitis B.  Haemophilus influenzae type b (Hib) vaccine. You may need this if you have certain conditions.  Talk to your health care provider about which screenings and vaccines you need and how often you need them. This information is not intended to replace advice given to you by your health care provider. Make sure you discuss any questions you have with your health care provider. Document Released: 08/04/2015 Document Revised: 03/27/2016 Document Reviewed: 05/09/2015 Elsevier Interactive Patient Education  [employer].

## 2018-01-06 NOTE — Telephone Encounter (Signed)
Telephone call from Client requesting results from lipid panel.  Provided lab  Results and recommendations  Per Colin Benton, DO.  Client voiced understanding.             Results not in result basket. initated telephone Encounter.

## 2018-01-08 ENCOUNTER — Other Ambulatory Visit: Payer: Self-pay | Admitting: *Deleted

## 2018-01-08 MED ORDER — ROSUVASTATIN CALCIUM 10 MG PO TABS: 10.0000 mg | ORAL_TABLET | Freq: Every day | ORAL | 3 refills | Status: DC

## 2018-01-16 ENCOUNTER — Telehealth: Payer: Self-pay | Admitting: *Deleted

## 2018-01-16 NOTE — Telephone Encounter (Signed)
I called the pt and she stated her form was rejected due to the dates of tests not being on the form.  I apologized to the pt and the form was completed with dates and refaxed to [phone].

## 2018-01-16 NOTE — Telephone Encounter (Signed)
Copied from [place] [id number]. Topic: Quick Communication - See Telephone Encounter >> Jan 15, 2018  3:51 PM Antonieta Iba C wrote: CRM for notification. See Telephone encounter for: 01/15/18.  Pt called in requesting a call back from [place]: [phone]

## 2018-01-27 ENCOUNTER — Encounter: Payer: Self-pay | Admitting: Family Medicine

## 2018-01-27 ENCOUNTER — Ambulatory Visit: Payer: Self-pay | Admitting: Sports Medicine

## 2018-02-10 ENCOUNTER — Encounter: Payer: Self-pay | Admitting: Sports Medicine

## 2018-02-10 ENCOUNTER — Ambulatory Visit: Payer: 59 | Admitting: Sports Medicine

## 2018-02-10 ENCOUNTER — Other Ambulatory Visit: Payer: Self-pay

## 2018-02-10 VITALS — BP 136/84 | HR 64 | Resp 16

## 2018-02-10 DIAGNOSIS — E119 Type 2 diabetes mellitus without complications: Secondary | ICD-10-CM | POA: Diagnosis not present

## 2018-02-10 NOTE — Progress Notes (Signed)
Subjective: Erica Sutton is a 62 y.o. female patient with history of diabetes who presents to office today for diabetic foot exam. Denies any acute concerns and reports that she does self nail care and does not want nail trim. Does admit to thick nails but denies any pain to feet or toes, denies history of swelling, redness, warmth, or open lesion. Patient denies any cramping, numbness, burning or tingling in the legs. Reports that her A1c was 6.1.  Review of Systems  All other systems reviewed and are negative.    Patient Active Problem List   Diagnosis Date Noted  . Hyperlipidemia associated with type 2 diabetes mellitus ([place]) 10/02/2017  . Type 2 diabetes mellitus without complication, without long-term current use of insulin ([place]) 10/02/2017  . Morbid obesity ([place]) 08/03/2015  . Essential hypertension 01/19/2007   Current Outpatient Medications on File Prior to Visit  Medication Sig Dispense Refill  . amLODipine (NORVASC) 5 MG tablet Take 1 tablet (5 mg total) by mouth daily. 90 tablet 3  . benazepril (LOTENSIN) 20 MG tablet Take 1 tablet (20 mg total) by mouth daily. 90 tablet 3  . hydrochlorothiazide (HYDRODIURIL) 25 MG tablet Take 1 tablet (25 mg total) by mouth daily. 90 tablet 3  . Multiple Vitamins-Minerals (MULTIVITAMIN WITH MINERALS) tablet Take 1 tablet by mouth daily.    . rosuvastatin (CRESTOR) 10 MG tablet Take 1 tablet (10 mg total) by mouth daily. 90 tablet 3   No current facility-administered medications on file prior to visit.    No Known Allergies  Recent Results (from the past 2160 hour(s))  Basic metabolic panel     Status: Abnormal   Collection Time: 01/06/18  9:02 AM  Result Value Ref Range   Sodium 139 135 - 145 mEq/L   Potassium 4.1 3.5 - 5.1 mEq/L   Chloride 100 96 - 112 mEq/L   CO2 32 19 - 32 mEq/L   Glucose, Bld 142 (H) 70 - 99 mg/dL   BUN 14 6 - 23 mg/dL   Creatinine, Ser 0.95 0.40 - 1.20 mg/dL   Calcium 9.8 8.4 - 10.5 mg/dL   GFR 76.87 >60.00  mL/min  CBC     Status: None   Collection Time: 01/06/18  9:02 AM  Result Value Ref Range   WBC 4.1 4.0 - 10.5 K/uL   RBC 4.27 3.87 - 5.11 Mil/uL   Platelets 338.0 150.0 - 400.0 K/uL   Hemoglobin 14.0 12.0 - 15.0 g/dL   HCT 40.7 36.0 - 46.0 %   MCV 95.3 78.0 - 100.0 fl   MCHC 34.3 30.0 - 36.0 g/dL   RDW 12.6 11.5 - 15.5 %  Hemoglobin A1c     Status: None   Collection Time: 01/06/18  9:02 AM  Result Value Ref Range   Hgb A1c MFr Bld 6.1 4.6 - 6.5 %    Comment: Glycemic Control Guidelines for People with Diabetes:Non Diabetic:  <6%Goal of Therapy: <7%Additional Action Suggested:  >8%   Lipid panel     Status: Abnormal   Collection Time: 01/06/18  9:02 AM  Result Value Ref Range   Cholesterol 227 (H) 0 - 200 mg/dL    Comment: ATP III Classification       Desirable:  < 200 mg/dL               Borderline High:  200 - 239 mg/dL          High:  > = 240 mg/dL   Triglycerides 195.0 (  H) 0.0 - 149.0 mg/dL    Comment: Normal:  <150 mg/dLBorderline High:  150 - 199 mg/dL   HDL 52.20 >39.00 mg/dL   VLDL 39.0 0.0 - 40.0 mg/dL   LDL Cholesterol 136 (H) 0 - 99 mg/dL   Total CHOL/HDL Ratio 4     Comment:                Men          Women1/2 Average Risk     3.4          3.3Average Risk          5.0          4.42X Average Risk          9.6          7.13X Average Risk          15.0          11.0                       NonHDL 174.75     Comment: NOTE:  Non-HDL goal should be 30 mg/dL higher than patient's LDL goal (i.e. LDL goal of < 70 mg/dL, would have non-HDL goal of < 100 mg/dL)    Objective: General: Patient is awake, alert, and oriented x 3 and in no acute distress.  Integument: Skin is warm, dry and supple bilateral. Nails are short, thickened and  dystrophic with subungual debris, consistent with onychomycosis, 1-5 bilateral. No signs of infection. No open lesions or preulcerative lesions present bilateral. Remaining integument unremarkable.  Vasculature:  Dorsalis Pedis pulse 2/4 bilateral.  Posterior Tibial pulse  1/4 bilateral.  Capillary fill time <3 sec 1-5 bilateral. Positive hair growth to the level of the digits. Temperature gradient within normal limits. No varicosities present bilateral. Trace edema present bilateral.   Neurology: The patient has intact sensation measured with a 5.07/10g Semmes Weinstein Monofilament at all pedal sites bilateral . Vibratory sensation intact bilateral with tuning fork. No Babinski sign present bilateral.   Musculoskeletal: Asymptomatic pes planus pedal deformities noted bilateral. Muscular strength 5/5 in all lower extremity muscular groups bilateral without pain on range of motion . No tenderness with calf compression bilateral.  Assessment and Plan: Problem List Items Addressed This Visit      Endocrine   Type 2 diabetes mellitus without complication, without long-term current use of insulin ([place])     Other   Morbid obesity ([place])    Other Visit Diagnoses    Encounter for diabetic foot exam ([place])    -  Primary      -Examined patient. -Discussed and educated patient on diabetic foot care, especially with  regards to the vascular, neurological and musculoskeletal systems.  -Stressed the importance of [place] glycemic control and the detriment of not  controlling glucose levels in relation to the foot. -Answered all patient questions -Patient to return yearly for diabetic foot exam or sooner if any problems or issues arise -Patient advised to call the office if any problems or questions arise in the meantime.  Landis Martins, DPM

## 2018-03-13 ENCOUNTER — Other Ambulatory Visit: Payer: Self-pay | Admitting: Family Medicine

## 2018-03-18 ENCOUNTER — Other Ambulatory Visit: Payer: Self-pay | Admitting: *Deleted

## 2018-03-18 ENCOUNTER — Telehealth: Payer: Self-pay | Admitting: Family Medicine

## 2018-03-18 MED ORDER — BENAZEPRIL HCL 20 MG PO TABS: 20.0000 mg | ORAL_TABLET | Freq: Every day | ORAL | 3 refills | Status: DC

## 2018-03-18 NOTE — Telephone Encounter (Signed)
Copied from [place]. Topic: Quick Communication - Rx Refill/Question >> Mar 18, 2018  9:28 AM Bea Graff, NT wrote: Medication: benazepril (LOTENSIN) 20 MG tablet   Has the patient contacted their pharmacy? Yes.   (Agent: If no, request that the patient contact the pharmacy for the refill.) (Agent: If yes, when and what did the pharmacy advise?)  Preferred Pharmacy (with phone number or street name): [facility] [phone] - [place], [place] - [address] [phone] (Phone) [phone] (Fax)    Agent: Please be advised that RX refills may take up to 3 business days. We ask that you follow-up with your pharmacy.

## 2018-05-20 ENCOUNTER — Other Ambulatory Visit: Payer: Self-pay | Admitting: Family Medicine

## 2018-06-02 ENCOUNTER — Ambulatory Visit: Payer: 59 | Admitting: Family Medicine

## 2018-06-02 ENCOUNTER — Ambulatory Visit (INDEPENDENT_AMBULATORY_CARE_PROVIDER_SITE_OTHER): Payer: 59

## 2018-06-02 ENCOUNTER — Encounter: Payer: Self-pay | Admitting: Family Medicine

## 2018-06-02 VITALS — BP 116/80 | HR 77 | Temp 98.2°F | Ht 61.2 in | Wt 192.0 lb

## 2018-06-02 DIAGNOSIS — R0600 Dyspnea, unspecified: Secondary | ICD-10-CM

## 2018-06-02 DIAGNOSIS — R52 Pain, unspecified: Secondary | ICD-10-CM

## 2018-06-02 DIAGNOSIS — R0602 Shortness of breath: Secondary | ICD-10-CM | POA: Diagnosis not present

## 2018-06-02 DIAGNOSIS — R05 Cough: Secondary | ICD-10-CM

## 2018-06-02 DIAGNOSIS — R059 Cough, unspecified: Secondary | ICD-10-CM

## 2018-06-02 DIAGNOSIS — R5381 Other malaise: Secondary | ICD-10-CM

## 2018-06-02 LAB — POC INFLUENZA A&B (BINAX/QUICKVUE)
Influenza A, POC: NEGATIVE
Influenza B, POC: NEGATIVE

## 2018-06-02 MED ORDER — PRAVASTATIN SODIUM 20 MG PO TABS: 20.0000 mg | ORAL_TABLET | Freq: Every day | ORAL | 0 refills | Status: DC

## 2018-06-02 MED ORDER — BENZONATATE 100 MG PO CAPS: 100.0000 mg | ORAL_CAPSULE | Freq: Three times a day (TID) | ORAL | 0 refills | Status: DC | PRN

## 2018-06-02 NOTE — Addendum Note (Signed)
Addended by: Lynnea Ferrier on: 06/02/2018 05:14 PM   Modules accepted: Orders

## 2018-06-02 NOTE — Addendum Note (Signed)
Addended by: Agnes Lawrence on: 06/02/2018 05:29 PM   Modules accepted: Orders

## 2018-06-02 NOTE — Patient Instructions (Addendum)
BEFORE YOU LEAVE: -CXR -flu swab -follow up: 2 weeks  I sent the pravastatin to try for the diabetes and cholesterol to protect the heart. Even if you can take it every other day that may provide some protection.  Aleve or tylenol if needed.  I sent some tessalon for the cough if needed.  INSTRUCTIONS FOR UPPER RESPIRATORY INFECTION:  -plenty of rest and fluids  -nasal saline wash 2-3 times daily (use prepackaged nasal saline or bottled/distilled water if making your own)   -can use tylenol (in no history of liver disease) or ibuprofen (if no history of kidney disease, bowel bleeding or significant heart disease) as directed for aches and sorethroat  -in the winter time, using a humidifier at night is helpful (please follow cleaning instructions)  -if you are taking a cough medication - use only as directed, may also try a teaspoon of honey to coat the throat and throat lozenges.   -for sore throat, salt water gargles can help  -follow up if you have fevers, facial pain, tooth pain, difficulty breathing or are worsening or symptoms persist longer then expected  Upper Respiratory Infection, Adult An upper respiratory infection (URI) is also known as the common cold. It is often caused by a type of germ (virus). Colds are easily spread (contagious). You can pass it to others by kissing, coughing, sneezing, or drinking out of the same glass. Usually, you get better in 1 to 3  weeks.  However, the cough can last for even longer. HOME CARE   Only take medicine as told by your doctor. Follow instructions provided above.  Drink enough water and fluids to keep your pee (urine) clear or pale yellow.  Get plenty of rest.  Return to work when your temperature is < 100 for 24 hours or as told by your doctor. You may use a face mask and wash your hands to stop your cold from spreading. GET HELP RIGHT AWAY IF:   After the first few days, you feel you are getting worse.  You have questions  about your medicine.  You have chills, shortness of breath, or red spit (mucus).  You have pain in the face for more then 1-2 days, especially when you bend forward.  You have a fever, puffy (swollen) neck, pain when you swallow, or white spots in the back of your throat.  You have a bad headache, ear pain, sinus pain, or chest pain.  You have a high-pitched whistling sound when you breathe in and out (wheezing).  You cough up blood.  You have sore muscles or a stiff neck. MAKE SURE YOU:   Understand these instructions.  Will watch your condition.  Will get help right away if you are not doing well or get worse. Document Released: 12/25/2007 Document Revised: 09/30/2011 Document Reviewed: 10/13/2013 [facility] Patient Information 2015 [place], [place]. This information is not intended to replace advice given to you by your health care provider. Make sure you discuss any questions you have with your health care provider.   Benzonatate capsules What is this medicine? BENZONATATE (ben ZOE na tate) is used to treat cough. This medicine may be used for other purposes; ask your health care provider or pharmacist if you have questions. COMMON BRAND NAME(S): Tessalon Perles, Zonatuss What should I tell my health care provider before I take this medicine? They need to know if you have any of these conditions: -kidney or liver disease -an unusual or allergic reaction to benzonatate, anesthetics, other medicines,  foods, dyes, or preservatives -pregnant or trying to get pregnant -breast-feeding How should I use this medicine? Take this medicine by mouth with a glass of water. Follow the directions on the prescription label. Avoid breaking, chewing, or sucking the capsule, as this can cause serious side effects. Take your medicine at regular intervals. Do not take your medicine more often than directed. Talk to your pediatrician regarding the use of this medicine in children. While this drug  may be prescribed for children as young as 47 years old for selected conditions, precautions do apply. Overdosage: If you think you have taken too much of this medicine contact a poison control center or emergency room at once. NOTE: This medicine is only for you. Do not share this medicine with others. What if I miss a dose? If you miss a dose, take it as soon as you can. If it is almost time for your next dose, take only that dose. Do not take double or extra doses. What may interact with this medicine? Do not take this medicine with any of the following medications: -MAOIs like Carbex, Eldepryl, Marplan, Nardil, and Parnate This list may not describe all possible interactions. Give your health care provider a list of all the medicines, herbs, non-prescription drugs, or dietary supplements you use. Also tell them if you smoke, drink alcohol, or use illegal drugs. Some items may interact with your medicine. What should I watch for while using this medicine? Tell your doctor if your symptoms do not improve or if they get worse. If you have a high fever, skin rash, or headache, see your health care professional. You may get drowsy or dizzy. Do not drive, use machinery, or do anything that needs mental alertness until you know how this medicine affects you. Do not sit or stand up quickly, especially if you are an older patient. This reduces the risk of dizzy or fainting spells. What side effects may I notice from receiving this medicine? Side effects that you should report to your doctor or health care professional as soon as possible: -allergic reactions like skin rash, itching or hives, swelling of the face, lips, or tongue -breathing problems -chest pain -confusion or hallucinations -irregular heartbeat -numbness of mouth or throat -seizures Side effects that usually do not require medical attention (report to your doctor or health care professional if they continue or are  bothersome): -burning feeling in the eyes -constipation -headache -nasal congestion -stomach upset This list may not describe all possible side effects. Call your doctor for medical advice about side effects. You may report side effects to FDA at 1-800-FDA-1088. Where should I keep my medicine? Keep out of the reach of children. Store at room temperature between 15 and 30 degrees C (59 and 86 degrees F). Keep tightly closed. Protect from light and moisture. Throw away any unused medicine after the expiration date. NOTE: This sheet is a summary. It may not cover all possible information. If you have questions about this medicine, talk to your doctor, pharmacist, or health care provider.  2018 Elsevier/Gold Standard (2007-10-07 14:52:56)

## 2018-06-02 NOTE — Progress Notes (Signed)
HPI:  Using dictation device. Unfortunately this device frequently misinterprets words/phrases.  Acute visit for:  "Not feeling well" -cough, body aches, mild dyspnea, mild upper chest tightness when coughs, PND, malaise -started 4 days ago -doing a little better, but still achy mainly in R upper back and side today and cough persists -had flu shot 2.5 weeks ago -no know flu exposure  Due for regular follow up. She stopped her statin - upset her stomach/ had the same with crestor and lipitor. No severe reaction. Was ok for a while, then felt like upset her stomach, still trying to work on diet and exercise.      ROS: See pertinent positives and negatives per HPI.  Past Medical History:  Diagnosis Date  . Anemia    intermittent her whole life, reports she had eval and was told to take MV  . Eclampsia   . GERD (gastroesophageal reflux disease) 08/03/2015   resolved 2016  . Hyperglycemia 02/19/2013  . Hyperlipidemia 02/19/2013  . Hypertension   . Obesity 08/03/2015    Past Surgical History:  Procedure Laterality Date  . ABDOMINAL HYSTERECTOMY    . CHOLECYSTECTOMY  11/10/12   lap chole with IOC  . shoulder shoulder      Family History  Problem Relation Age of Onset  . Diabetes Mother   . Liver disease Father   . Alcohol abuse Father     SOCIAL HX: see hpi   Current Outpatient Medications:  .  amLODipine (NORVASC) 5 MG tablet, TAKE 1 TABLET BY MOUTH ONCE DAILY, Disp: 90 tablet, Rfl: 0 .  benazepril (LOTENSIN) 20 MG tablet, Take 1 tablet (20 mg total) by mouth daily., Disp: 90 tablet, Rfl: 3 .  hydrochlorothiazide (HYDRODIURIL) 25 MG tablet, TAKE 1 TABLET BY MOUTH ONCE DAILY, Disp: 90 tablet, Rfl: 0 .  Multiple Vitamins-Minerals (MULTIVITAMIN WITH MINERALS) tablet, Take 1 tablet by mouth daily., Disp: , Rfl:  .  benzonatate (TESSALON PERLES) 100 MG capsule, Take 1 capsule (100 mg total) by mouth 3 (three) times daily as needed., Disp: 20 capsule, Rfl: 0 .  pravastatin  (PRAVACHOL) 20 MG tablet, Take 1 tablet (20 mg total) by mouth daily., Disp: 90 tablet, Rfl: 0  EXAM:  Vitals:   06/02/18 1641  BP: 116/80  Pulse: 77  Temp: 98.2 F (36.8 C)  SpO2: 97%    Body mass index is 36.04 kg/m.  GENERAL: vitals reviewed and listed above, alert, oriented, appears well hydrated and in no acute distress  HEENT: atraumatic, conjunttiva clear, no obvious abnormalities on inspection of external nose and ears, normal appearance of ear canals and TMs, clear nasal congestion, mild post oropharyngeal erythema with PND, no tonsillar edema or exudate, no sinus TTP  NECK: no obvious masses on inspection  LUNGS: clear to auscultation bilaterally, no wheezes, rales or rhonchi, [place] air movement  CV: HRRR, no peripheral edema  MS: moves all extremities without noticeable abnormality, TTP R trap muscle mid fibers, normal TOM head and neck  SKIN: no rash on back   PSYCH: pleasant and cooperative, no obvious depression or anxiety  ASSESSMENT AND PLAN:  Discussed the following assessment and plan:  Cough - Plan: DG Chest 2 View  Malaise  Body aches  Dyspnea, unspecified type - Plan: DG Chest 2 View  -we discussed possible serious and likely etiologies, workup and treatment, treatment risks and return precautions - suspect viral resp illness or mild flu.  -after this discussion, [place] opted for CXR to exclude LRI/other, flu test,  symptomatic care -follow up advised 2 weeks for regular follow up, trial every other day dosing pravastatin - lipid clinic referral if does not tolerate -of course, we advised [place]  to return or notify a doctor immediately if symptoms worsen or persist or new concerns arise.   Patient Instructions  BEFORE YOU LEAVE: -CXR -flu swab -follow up: 2 weeks  I sent the pravastatin to try for the diabetes and cholesterol to protect the heart. Even if you can take it every other day that may provide some protection.  Aleve or tylenol if  needed.  I sent some tessalon for the cough if needed.  INSTRUCTIONS FOR UPPER RESPIRATORY INFECTION:  -plenty of rest and fluids  -nasal saline wash 2-3 times daily (use prepackaged nasal saline or bottled/distilled water if making your own)   -can use tylenol (in no history of liver disease) or ibuprofen (if no history of kidney disease, bowel bleeding or significant heart disease) as directed for aches and sorethroat  -in the winter time, using a humidifier at night is helpful (please follow cleaning instructions)  -if you are taking a cough medication - use only as directed, may also try a teaspoon of honey to coat the throat and throat lozenges.   -for sore throat, salt water gargles can help  -follow up if you have fevers, facial pain, tooth pain, difficulty breathing or are worsening or symptoms persist longer then expected  Upper Respiratory Infection, Adult An upper respiratory infection (URI) is also known as the common cold. It is often caused by a type of germ (virus). Colds are easily spread (contagious). You can pass it to others by kissing, coughing, sneezing, or drinking out of the same glass. Usually, you get better in 1 to 3  weeks.  However, the cough can last for even longer. HOME CARE   Only take medicine as told by your doctor. Follow instructions provided above.  Drink enough water and fluids to keep your pee (urine) clear or pale yellow.  Get plenty of rest.  Return to work when your temperature is < 100 for 24 hours or as told by your doctor. You may use a face mask and wash your hands to stop your cold from spreading. GET HELP RIGHT AWAY IF:   After the first few days, you feel you are getting worse.  You have questions about your medicine.  You have chills, shortness of breath, or red spit (mucus).  You have pain in the face for more then 1-2 days, especially when you bend forward.  You have a fever, puffy (swollen) neck, pain when you swallow, or  white spots in the back of your throat.  You have a bad headache, ear pain, sinus pain, or chest pain.  You have a high-pitched whistling sound when you breathe in and out (wheezing).  You cough up blood.  You have sore muscles or a stiff neck. MAKE SURE YOU:   Understand these instructions.  Will watch your condition.  Will get help right away if you are not doing well or get worse. Document Released: 12/25/2007 Document Revised: 09/30/2011 Document Reviewed: 10/13/2013 [facility] Patient Information 2015 [place], [place]. This information is not intended to replace advice given to you by your health care provider. Make sure you discuss any questions you have with your health care provider.   Benzonatate capsules What is this medicine? BENZONATATE (ben ZOE na tate) is used to treat cough. This medicine may be used for other purposes; ask your  health care provider or pharmacist if you have questions. COMMON BRAND NAME(S): Tessalon Perles, Zonatuss What should I tell my health care provider before I take this medicine? They need to know if you have any of these conditions: -kidney or liver disease -an unusual or allergic reaction to benzonatate, anesthetics, other medicines, foods, dyes, or preservatives -pregnant or trying to get pregnant -breast-feeding How should I use this medicine? Take this medicine by mouth with a glass of water. Follow the directions on the prescription label. Avoid breaking, chewing, or sucking the capsule, as this can cause serious side effects. Take your medicine at regular intervals. Do not take your medicine more often than directed. Talk to your pediatrician regarding the use of this medicine in children. While this drug may be prescribed for children as young as 53 years old for selected conditions, precautions do apply. Overdosage: If you think you have taken too much of this medicine contact a poison control center or emergency room at once. NOTE: This  medicine is only for you. Do not share this medicine with others. What if I miss a dose? If you miss a dose, take it as soon as you can. If it is almost time for your next dose, take only that dose. Do not take double or extra doses. What may interact with this medicine? Do not take this medicine with any of the following medications: -MAOIs like Carbex, Eldepryl, Marplan, Nardil, and Parnate This list may not describe all possible interactions. Give your health care provider a list of all the medicines, herbs, non-prescription drugs, or dietary supplements you use. Also tell them if you smoke, drink alcohol, or use illegal drugs. Some items may interact with your medicine. What should I watch for while using this medicine? Tell your doctor if your symptoms do not improve or if they get worse. If you have a high fever, skin rash, or headache, see your health care professional. You may get drowsy or dizzy. Do not drive, use machinery, or do anything that needs mental alertness until you know how this medicine affects you. Do not sit or stand up quickly, especially if you are an older patient. This reduces the risk of dizzy or fainting spells. What side effects may I notice from receiving this medicine? Side effects that you should report to your doctor or health care professional as soon as possible: -allergic reactions like skin rash, itching or hives, swelling of the face, lips, or tongue -breathing problems -chest pain -confusion or hallucinations -irregular heartbeat -numbness of mouth or throat -seizures Side effects that usually do not require medical attention (report to your doctor or health care professional if they continue or are bothersome): -burning feeling in the eyes -constipation -headache -nasal congestion -stomach upset This list may not describe all possible side effects. Call your doctor for medical advice about side effects. You may report side effects to FDA at  1-800-FDA-1088. Where should I keep my medicine? Keep out of the reach of children. Store at room temperature between 15 and 30 degrees C (59 and 86 degrees F). Keep tightly closed. Protect from light and moisture. Throw away any unused medicine after the expiration date. NOTE: This sheet is a summary. It may not cover all possible information. If you have questions about this medicine, talk to your doctor, pharmacist, or health care provider.  2018 Elsevier/Gold Standard (2007-10-07 14:52:56)     Lucretia Kern, DO

## 2018-06-04 ENCOUNTER — Telehealth: Payer: Self-pay | Admitting: *Deleted

## 2018-06-04 NOTE — Telephone Encounter (Signed)
Copied from [place] [id number]. Topic: General - Other >> Jun 04, 2018 10:51 AM Carolyn Stare wrote:  Pt calling bout her xray results would like a call back

## 2018-06-16 ENCOUNTER — Ambulatory Visit: Payer: 59 | Admitting: Family Medicine

## 2018-06-16 ENCOUNTER — Encounter: Payer: Self-pay | Admitting: Family Medicine

## 2018-06-16 VITALS — BP 118/72 | HR 75 | Temp 98.2°F | Ht 61.2 in | Wt 193.9 lb

## 2018-06-16 DIAGNOSIS — E119 Type 2 diabetes mellitus without complications: Secondary | ICD-10-CM | POA: Diagnosis not present

## 2018-06-16 DIAGNOSIS — I10 Essential (primary) hypertension: Secondary | ICD-10-CM | POA: Diagnosis not present

## 2018-06-16 DIAGNOSIS — E1169 Type 2 diabetes mellitus with other specified complication: Secondary | ICD-10-CM | POA: Diagnosis not present

## 2018-06-16 DIAGNOSIS — E1159 Type 2 diabetes mellitus with other circulatory complications: Secondary | ICD-10-CM | POA: Diagnosis not present

## 2018-06-16 DIAGNOSIS — I152 Hypertension secondary to endocrine disorders: Secondary | ICD-10-CM

## 2018-06-16 DIAGNOSIS — E785 Hyperlipidemia, unspecified: Secondary | ICD-10-CM

## 2018-06-16 LAB — CBC
HCT: 41.6 % (ref 36.0–46.0)
HEMOGLOBIN: 14 g/dL (ref 12.0–15.0)
MCHC: 33.7 g/dL (ref 30.0–36.0)
MCV: 95.8 fl (ref 78.0–100.0)
PLATELETS: 371 10*3/uL (ref 150.0–400.0)
RBC: 4.35 Mil/uL (ref 3.87–5.11)
RDW: 12.4 % (ref 11.5–15.5)
WBC: 5.2 10*3/uL (ref 4.0–10.5)

## 2018-06-16 LAB — BASIC METABOLIC PANEL
BUN: 13 mg/dL (ref 6–23)
CO2: 31 mEq/L (ref 19–32)
Calcium: 9.8 mg/dL (ref 8.4–10.5)
Chloride: 99 mEq/L (ref 96–112)
Creatinine, Ser: 0.82 mg/dL (ref 0.40–1.20)
GFR: 90.96 mL/min (ref >60.00)
Glucose, Bld: 138 mg/dL — ABNORMAL HIGH (ref 70–99)
POTASSIUM: 4.1 meq/L (ref 3.5–5.1)
SODIUM: 137 meq/L (ref 135–145)

## 2018-06-16 LAB — HEMOGLOBIN A1C: Hgb A1c MFr Bld: 5.8 % (ref 4.6–6.5)

## 2018-06-16 NOTE — Progress Notes (Signed)
HPI:  Using dictation device. Unfortunately this device frequently misinterprets words/phrases.  Erica Sutton is a pleasant 62 y.o. here for follow up. Chronic medical problems summarized below were reviewed for changes and stability and were updated as needed below. These issues and their treatment remain stable for the most part.  Was dealing with upper respiratory illness at her last visit.  Today reports doing better.  She also had voiced that the statin upset her stomach.  This is happened with two statins.  Not severe and symptoms intermittent, however was enough to make her stop the medication.  We were doing a trial of pravastatin at her last visit and suggested taking every other day if did not tolerate taking it daily.  Reports doing fine on this. Denies CP, SOB, DOE, treatment intolerance or new symptoms.  Due for labs, eye exam  Diabetes: -with htn, hld, obesity -meds:diet controlled, on statin and acei  HLD: -poor compliance/tolerance to statins -stomach upset with crestor and lipitor daily dosing -trying pravastatin alt day dosing  HTN: -meds: lisinopril, amlodipine, hctz  Morbid obesity: -BMI > 35 with diabetes, hld, htn -wt 193 11/19 -diet ok, not exercising    ROS: See pertinent positives and negatives per HPI.  Past Medical History:  Diagnosis Date  . Anemia    intermittent her whole life, reports she had eval and was told to take MV  . Eclampsia   . GERD (gastroesophageal reflux disease) 08/03/2015   resolved 2016  . Hyperglycemia 02/19/2013  . Hyperlipidemia 02/19/2013  . Hypertension   . Obesity 08/03/2015    Past Surgical History:  Procedure Laterality Date  . ABDOMINAL HYSTERECTOMY    . CHOLECYSTECTOMY  11/10/12   lap chole with IOC  . shoulder shoulder      Family History  Problem Relation Age of Onset  . Diabetes Mother   . Liver disease Father   . Alcohol abuse Father     SOCIAL HX: see hpi   Current Outpatient Medications:  .   amLODipine (NORVASC) 5 MG tablet, TAKE 1 TABLET BY MOUTH ONCE DAILY, Disp: 90 tablet, Rfl: 0 .  benazepril (LOTENSIN) 20 MG tablet, Take 1 tablet (20 mg total) by mouth daily., Disp: 90 tablet, Rfl: 3 .  benzonatate (TESSALON PERLES) 100 MG capsule, Take 1 capsule (100 mg total) by mouth 3 (three) times daily as needed., Disp: 20 capsule, Rfl: 0 .  hydrochlorothiazide (HYDRODIURIL) 25 MG tablet, TAKE 1 TABLET BY MOUTH ONCE DAILY, Disp: 90 tablet, Rfl: 0 .  Multiple Vitamins-Minerals (MULTIVITAMIN WITH MINERALS) tablet, Take 1 tablet by mouth daily., Disp: , Rfl:  .  pravastatin (PRAVACHOL) 20 MG tablet, Take 1 tablet (20 mg total) by mouth daily., Disp: 90 tablet, Rfl: 0  EXAM:  Vitals:   06/16/18 1326  BP: 118/72  Pulse: 75  Temp: 98.2 F (36.8 C)    Body mass index is 36.4 kg/m.  GENERAL: vitals reviewed and listed above, alert, oriented, appears well hydrated and in no acute distress  HEENT: atraumatic, conjunttiva clear, no obvious abnormalities on inspection of external nose and ears  NECK: no obvious masses on inspection  LUNGS: clear to auscultation bilaterally, no wheezes, rales or rhonchi, Erica Sutton air movement  CV: HRRR, no peripheral edema  MS: moves all extremities without noticeable abnormality  PSYCH: pleasant and cooperative, no obvious depression or anxiety  ASSESSMENT AND PLAN:  Discussed the following assessment and plan:  Type 2 diabetes mellitus without complication, without long-term current use of insulin (  Erica Sutton) - Plan: Hemoglobin A1c  Hyperlipidemia associated with type 2 diabetes mellitus (Erica Sutton)  Hypertension associated with diabetes (Erica Sutton) - Plan: Basic metabolic panel, CBC  Morbid obesity (Erica Sutton)  -labs per orders -cont statin every other day - fasting lipids next check -lifestyle recs - advised healthy low sugar diet and regular exercise -follow up 3-4 months, sooner as needed -eye exam advised, she agrees to schedule with her eye doctor -Patient  advised to return or notify a doctor immediately if symptoms worsen or persist or new concerns arise.  Patient Instructions  BEFORE YOU LEAVE: -phq9 -labs -follow up: 3-4 months (morning appt and come fasting)  Schedule eye exam - call today.  Increase exercise to 150 minutes per week.  Eat a healthy low sugar diet.  Continue the Pravastatin every other day.  We have ordered labs or studies at this visit. It can take up to 1-2 weeks for results and processing. IF results require follow up or explanation, we will call you with instructions. Clinically stable results will be released to your Erica Sutton. If you have not heard from [place] or cannot find your results in Erica Sutton in 2 weeks please contact our office at [phone].  If you are not yet signed up for [facility], please consider signing up.      We recommend the following healthy lifestyle for LIFE: 1) Small portions. But, make sure to get regular (at least 3 per day), healthy meals and small healthy snacks if needed.  2) Eat a healthy clean diet.   TRY TO EAT: -at least 5-7 servings of low sugar, colorful, and nutrient rich vegetables per day (not corn, potatoes or bananas.) -berries are the best choice if you wish to eat fruit (only eat small amounts if trying to reduce weight)  -lean meets (fish, white meat of chicken or [place]) -vegan proteins for some meals - beans or tofu, whole grains, nuts and seeds -Replace bad fats with [place] fats - [place] fats include: fish, nuts and seeds, canola oil, olive oil -small amounts of low fat or non fat dairy -small amounts of100 % whole grains - check the lables -drink plenty of water  AVOID: -SUGAR, sweets, anything with added sugar, corn syrup or sweeteners - must read labels as even foods advertised as "healthy" often are loaded with sugar -if you must have a sweetener, small amounts of stevia may be best -sweetened beverages and artificially sweetened beverages -simple starches (rice,  bread, potatoes, pasta, chips, etc - small amounts of 100% whole grains are ok) -red meat, pork, butter -fried foods, fast food, processed food, excessive dairy, eggs and coconut.  3)Get at least 150 minutes of sweaty aerobic exercise per week.  4)Reduce stress - consider counseling, meditation and relaxation to balance other aspects of your life.       Lucretia Kern, DO

## 2018-06-16 NOTE — Patient Instructions (Signed)
BEFORE YOU LEAVE: -phq9 -labs -follow up: 3-4 months (morning appt and come fasting)  Schedule eye exam - call today.  Increase exercise to 150 minutes per week.  Eat a healthy low sugar diet.  Continue the Pravastatin every other day.  We have ordered labs or studies at this visit. It can take up to 1-2 weeks for results and processing. IF results require follow up or explanation, we will call you with instructions. Clinically stable results will be released to your [facility]. If you have not heard from [place] or cannot find your results in [facility] in 2 weeks please contact our office at [phone].  If you are not yet signed up for [facility], please consider signing up.      We recommend the following healthy lifestyle for LIFE: 1) Small portions. But, make sure to get regular (at least 3 per day), healthy meals and small healthy snacks if needed.  2) Eat a healthy clean diet.   TRY TO EAT: -at least 5-7 servings of low sugar, colorful, and nutrient rich vegetables per day (not corn, potatoes or bananas.) -berries are the best choice if you wish to eat fruit (only eat small amounts if trying to reduce weight)  -lean meets (fish, white meat of chicken or [place]) -vegan proteins for some meals - beans or tofu, whole grains, nuts and seeds -Replace bad fats with [place] fats - [place] fats include: fish, nuts and seeds, canola oil, olive oil -small amounts of low fat or non fat dairy -small amounts of100 % whole grains - check the lables -drink plenty of water  AVOID: -SUGAR, sweets, anything with added sugar, corn syrup or sweeteners - must read labels as even foods advertised as "healthy" often are loaded with sugar -if you must have a sweetener, small amounts of stevia may be best -sweetened beverages and artificially sweetened beverages -simple starches (rice, bread, potatoes, pasta, chips, etc - small amounts of 100% whole grains are ok) -red meat, pork, butter -fried foods, fast  food, processed food, excessive dairy, eggs and coconut.  3)Get at least 150 minutes of sweaty aerobic exercise per week.  4)Reduce stress - consider counseling, meditation and relaxation to balance other aspects of your life.

## 2018-08-03 DIAGNOSIS — M25511 Pain in right shoulder: Secondary | ICD-10-CM | POA: Diagnosis not present

## 2018-09-07 DIAGNOSIS — M25511 Pain in right shoulder: Secondary | ICD-10-CM | POA: Diagnosis not present

## 2018-10-15 ENCOUNTER — Ambulatory Visit: Payer: 59 | Admitting: Family Medicine

## 2018-11-19 ENCOUNTER — Encounter: Payer: Self-pay | Admitting: Family Medicine

## 2018-11-19 ENCOUNTER — Other Ambulatory Visit: Payer: Self-pay

## 2018-11-19 ENCOUNTER — Ambulatory Visit (INDEPENDENT_AMBULATORY_CARE_PROVIDER_SITE_OTHER): Payer: 59 | Admitting: Family Medicine

## 2018-11-19 VITALS — Wt 188.3 lb

## 2018-11-19 DIAGNOSIS — E119 Type 2 diabetes mellitus without complications: Secondary | ICD-10-CM

## 2018-11-19 DIAGNOSIS — I10 Essential (primary) hypertension: Secondary | ICD-10-CM | POA: Diagnosis not present

## 2018-11-19 DIAGNOSIS — E785 Hyperlipidemia, unspecified: Secondary | ICD-10-CM

## 2018-11-19 DIAGNOSIS — E1169 Type 2 diabetes mellitus with other specified complication: Secondary | ICD-10-CM | POA: Diagnosis not present

## 2018-11-19 MED ORDER — BENAZEPRIL HCL 20 MG PO TABS: 20.0000 mg | ORAL_TABLET | Freq: Every day | ORAL | 0 refills | Status: DC

## 2018-11-19 MED ORDER — AMLODIPINE BESYLATE 5 MG PO TABS: 5.0000 mg | ORAL_TABLET | Freq: Every day | ORAL | 0 refills | Status: DC

## 2018-11-19 MED ORDER — PRAVASTATIN SODIUM 20 MG PO TABS: 20.0000 mg | ORAL_TABLET | Freq: Every day | ORAL | 0 refills | Status: DC

## 2018-11-19 MED ORDER — HYDROCHLOROTHIAZIDE 25 MG PO TABS: 25.0000 mg | ORAL_TABLET | Freq: Every day | ORAL | 0 refills | Status: DC

## 2018-11-19 NOTE — Progress Notes (Signed)
Virtual Visit via Video Note  I connected with [place]  on 11/19/18 at  3:00 PM EDT by a video enabled telemedicine application and verified that I am speaking with the correct person using two identifiers.  Location patient: home Location provider:work or home office Persons participating in the virtual visit: patient, provider  I discussed the limitations of evaluation and management by telemedicine and the availability of in person appointments. The patient expressed understanding and agreed to proceed.   HPI:  Erica Sutton is a pleasant 63 y.o. here for follow up. Chronic medical problems summarized below were reviewed for changes and stability and were updated as needed below. These issues and their treatment remain stable for the most part.  Reports doing well. Doing virtual exercise clase 3 days per week and walks frequently. Really working on cutting back on snacks. Does not have a way to check BS or glu monitor. Denies CP, SOB, DOE, fatigue, frequent urination treatment intolerance or new symptoms. Requests refills. Due for labs.  Diabetes: -with htn, hld, obesity -meds:diet controlled, on statin and acei  HLD: -poor compliance/tolerance to statins -stomach upset with crestor and lipitor daily dosing -trying pravastatin alt day dosing and is tolerating  HTN: -meds: lisinopril, amlodipine, hctz  Morbid obesity: -BMI > 35 with diabetes, hld, htn -wt 193 11/19 --> 188 today   ROS: See pertinent positives and negatives per HPI.  Past Medical History:  Diagnosis Date  . Anemia    intermittent her whole life, reports she had eval and was told to take MV  . Eclampsia   . GERD (gastroesophageal reflux disease) 08/03/2015   resolved 2016  . Hyperglycemia 02/19/2013  . Hyperlipidemia 02/19/2013  . Hypertension   . Obesity 08/03/2015    Past Surgical History:  Procedure Laterality Date  . ABDOMINAL HYSTERECTOMY    . CHOLECYSTECTOMY  11/10/12   lap chole with IOC  .  shoulder shoulder      Family History  Problem Relation Age of Onset  . Diabetes Mother   . Liver disease Father   . Alcohol abuse Father     SOCIAL HX: see hpi  Current Outpatient Medications:  .  amLODipine (NORVASC) 5 MG tablet, Take 1 tablet (5 mg total) by mouth daily., Disp: 90 tablet, Rfl: 0 .  benazepril (LOTENSIN) 20 MG tablet, Take 1 tablet (20 mg total) by mouth daily., Disp: 90 tablet, Rfl: 0 .  hydrochlorothiazide (HYDRODIURIL) 25 MG tablet, Take 1 tablet (25 mg total) by mouth daily., Disp: 90 tablet, Rfl: 0 .  Multiple Vitamins-Minerals (MULTIVITAMIN WITH MINERALS) tablet, Take 1 tablet by mouth daily., Disp: , Rfl:  .  pravastatin (PRAVACHOL) 20 MG tablet, Take 1 tablet (20 mg total) by mouth daily., Disp: 90 tablet, Rfl: 0  EXAM:  VITALS per patient if applicable:  Filed Weights   11/19/18 1523  Weight: 188 lb 4.8 oz (85.4 kg)  Body mass index is 35.35 kg/m.   GENERAL: alert, oriented, appears well and in no acute distress  HEENT: atraumatic, conjunttiva clear, no obvious abnormalities on inspection of external nose and ears  NECK: normal movements of the head and neck  LUNGS: on inspection no signs of respiratory distress, breathing rate appears normal, no obvious gross SOB, gasping or wheezing  CV: no obvious cyanosis  MS: moves all visible extremities without noticeable abnormality  PSYCH/NEURO: pleasant and cooperative, no obvious depression or anxiety, speech and thought processing grossly intact  ASSESSMENT AND PLAN:  Discussed the following assessment and  plan:  Type 2 diabetes mellitus without complication, without long-term current use of insulin ([place]) - Plan: Hemoglobin A1c  Hyperlipidemia associated with type 2 diabetes mellitus ([place]) - Plan: Lipid panel  Essential hypertension - Plan: Basic metabolic panel, CBC  Morbid obesity ([place])  Doing well. Congratulated on lifestyle changes. Labs ordered. Medications refilled. Meet and  greet next follow up with Dr. Ethlyn Gallery, then with Dr. Maudie Mercury after that.   I discussed the assessment and treatment plan with the patient. The patient was provided an opportunity to ask questions and all were answered. The patient agreed with the plan and demonstrated an understanding of the instructions.   The patient was advised to call back or seek an in-person evaluation if the symptoms worsen or if the condition fails to improve as anticipated.    Follow up instructions: Advised assistant Wendie Simmer to help patient arrange the following: -send written orders ofr BP cuff/gluc monitor to use flex dollars -meet and greet with Dr. Ethlyn Gallery 3 months -lab visit sometime in next 2 weeks  Lucretia Kern, DO

## 2018-11-26 ENCOUNTER — Telehealth: Payer: Self-pay | Admitting: *Deleted

## 2018-11-26 MED ORDER — ACCU-CHEK GUIDE VI STRP: ORAL_STRIP | 3 refills | Status: AC

## 2018-11-26 MED ORDER — ACCU-CHEK FASTCLIX LANCETS MISC: 3 refills | Status: AC

## 2018-11-26 MED ORDER — BLOOD PRESSURE KIT KIT: PACK | 0 refills | Status: DC

## 2018-11-26 MED ORDER — ACCU-CHEK GUIDE ME W/DEVICE KIT: 1.0000 | PACK | 0 refills | Status: AC

## 2018-11-26 NOTE — Telephone Encounter (Signed)
Per office notes from 4/30, I called the pt and scheduled a lab visit and a transfer of care with Dr Ethlyn Gallery.  Free sample for an Accu-chek Guide Me glucometer, printed Rxs for test strips, lancets and BP cuff were left at the front desk for pick up.

## 2018-11-26 NOTE — Addendum Note (Signed)
Addended by: Agnes Lawrence on: 11/26/2018 09:31 AM   Modules accepted: Orders

## 2018-12-03 ENCOUNTER — Other Ambulatory Visit (INDEPENDENT_AMBULATORY_CARE_PROVIDER_SITE_OTHER): Payer: 59

## 2018-12-03 ENCOUNTER — Other Ambulatory Visit: Payer: Self-pay

## 2018-12-03 DIAGNOSIS — I10 Essential (primary) hypertension: Secondary | ICD-10-CM | POA: Diagnosis not present

## 2018-12-03 DIAGNOSIS — E785 Hyperlipidemia, unspecified: Secondary | ICD-10-CM | POA: Diagnosis not present

## 2018-12-03 DIAGNOSIS — E1169 Type 2 diabetes mellitus with other specified complication: Secondary | ICD-10-CM | POA: Diagnosis not present

## 2018-12-03 DIAGNOSIS — E119 Type 2 diabetes mellitus without complications: Secondary | ICD-10-CM | POA: Diagnosis not present

## 2018-12-03 LAB — CBC
HCT: 39.7 % (ref 36.0–46.0)
Hemoglobin: 13.8 g/dL (ref 12.0–15.0)
MCHC: 34.7 g/dL (ref 30.0–36.0)
MCV: 96.5 fl (ref 78.0–100.0)
Platelets: 304 10*3/uL (ref 150.0–400.0)
RBC: 4.12 Mil/uL (ref 3.87–5.11)
RDW: 12.6 % (ref 11.5–15.5)
WBC: 4.6 10*3/uL (ref 4.0–10.5)

## 2018-12-03 LAB — BASIC METABOLIC PANEL
BUN: 16 mg/dL (ref 6–23)
CO2: 30 mEq/L (ref 19–32)
Calcium: 9.6 mg/dL (ref 8.4–10.5)
Chloride: 99 mEq/L (ref 96–112)
Creatinine, Ser: 0.91 mg/dL (ref 0.40–1.20)
GFR: 75.77 mL/min (ref >60.00)
Glucose, Bld: 160 mg/dL — ABNORMAL HIGH (ref 70–99)
Potassium: 3.9 mEq/L (ref 3.5–5.1)
Sodium: 137 mEq/L (ref 135–145)

## 2018-12-03 LAB — LIPID PANEL
Cholesterol: 239 mg/dL — ABNORMAL HIGH (ref 0–200)
HDL: 49.7 mg/dL (ref >39.00)
LDL Cholesterol: 155 mg/dL — ABNORMAL HIGH (ref 0–99)
NonHDL: 188.95
Total CHOL/HDL Ratio: 5
Triglycerides: 170 mg/dL — ABNORMAL HIGH (ref 0.0–149.0)
VLDL: 34 mg/dL (ref 0.0–40.0)

## 2018-12-03 LAB — HEMOGLOBIN A1C: Hgb A1c MFr Bld: 6.1 % (ref 4.6–6.5)

## 2018-12-16 ENCOUNTER — Other Ambulatory Visit: Payer: Self-pay | Admitting: *Deleted

## 2018-12-17 MED ORDER — ROSUVASTATIN CALCIUM 10 MG PO TABS: 10.0000 mg | ORAL_TABLET | Freq: Every day | ORAL | 3 refills | Status: DC

## 2018-12-17 NOTE — Addendum Note (Signed)
Addended by: Agnes Lawrence on: 12/17/2018 11:28 AM   Modules accepted: Orders

## 2018-12-17 NOTE — Addendum Note (Signed)
Addended by: Lahoma Crocker A on: 12/17/2018 11:30 AM   Modules accepted: Orders

## 2019-01-19 ENCOUNTER — Other Ambulatory Visit: Payer: Self-pay | Admitting: Obstetrics and Gynecology

## 2019-01-19 ENCOUNTER — Other Ambulatory Visit: Payer: Self-pay | Admitting: Family Medicine

## 2019-01-19 DIAGNOSIS — Z1231 Encounter for screening mammogram for malignant neoplasm of breast: Secondary | ICD-10-CM

## 2019-03-03 ENCOUNTER — Other Ambulatory Visit: Payer: Self-pay | Admitting: Family Medicine

## 2019-03-03 ENCOUNTER — Encounter: Payer: 59 | Admitting: Family Medicine

## 2019-03-04 ENCOUNTER — Ambulatory Visit
Admission: RE | Admit: 2019-03-04 | Discharge: 2019-03-04 | Disposition: A | Discharge status: Home / Self Care | Payer: 59 | Source: Ambulatory Visit | Attending: Family Medicine | Admitting: Family Medicine

## 2019-03-04 ENCOUNTER — Other Ambulatory Visit: Payer: Self-pay

## 2019-03-04 DIAGNOSIS — Z1231 Encounter for screening mammogram for malignant neoplasm of breast: Secondary | ICD-10-CM

## 2019-03-06 ENCOUNTER — Other Ambulatory Visit: Payer: Self-pay | Admitting: Family Medicine

## 2019-04-07 ENCOUNTER — Encounter: Payer: Self-pay | Admitting: Family Medicine

## 2019-04-07 ENCOUNTER — Ambulatory Visit (INDEPENDENT_AMBULATORY_CARE_PROVIDER_SITE_OTHER): Payer: 59 | Admitting: Family Medicine

## 2019-04-07 ENCOUNTER — Other Ambulatory Visit: Payer: Self-pay

## 2019-04-07 VITALS — Wt 194.0 lb

## 2019-04-07 DIAGNOSIS — E119 Type 2 diabetes mellitus without complications: Secondary | ICD-10-CM | POA: Diagnosis not present

## 2019-04-07 DIAGNOSIS — E785 Hyperlipidemia, unspecified: Secondary | ICD-10-CM | POA: Diagnosis not present

## 2019-04-07 DIAGNOSIS — I10 Essential (primary) hypertension: Secondary | ICD-10-CM

## 2019-04-07 DIAGNOSIS — E1169 Type 2 diabetes mellitus with other specified complication: Secondary | ICD-10-CM

## 2019-04-07 NOTE — Progress Notes (Signed)
Virtual Visit via Video Note  I connected with Erica Sutton   on 04/07/19 at  3:00 PM EDT by a video enabled telemedicine application and verified that I am speaking with the correct person using two identifiers.  Location patient: home Location provider:work office Persons participating in the virtual visit: patient, provider  I discussed the limitations of evaluation and management by telemedicine and the availability of in person appointments. The patient expressed understanding and agreed to proceed.   Erica Sutton DOB: [birth year] Encounter date: 04/07/2019  This is a 63 y.o. female who presents to establish care.    History of present illness: No specific concerns today.   HTN: amlodipine 5mg  daily, benazepril 20mg  daily, hctz 25mg  daily. Hasn't checked at home in awhile. Has been well controlled on these medicines. No side effects.   HL: crestor 10mg  daily (needs recheck lipids after change from pravastatin to crestor). No problems with change in med. She doesn't have lab visit date yet.   DMII: diet controlled. Has also been exercising with sn - running/training in morning.   Hx of anemia: hasn't had issues with this in awhile. Energy level is [employer].     Past Medical History:  Diagnosis Date  . Anemia    intermittent her whole life, reports she had eval and was told to take MV  . Eclampsia   . GERD (gastroesophageal reflux disease) 08/03/2015   resolved 2016  . Hyperglycemia 02/19/2013  . Hyperlipidemia 02/19/2013  . Hypertension   . Obesity 08/03/2015   Past Surgical History:  Procedure Laterality Date  . ABDOMINAL HYSTERECTOMY     heavy menses. no concerns for cancer.   . CHOLECYSTECTOMY  11/10/12   lap chole with IOC  . shoulder shoulder Bilateral    rotator cuff bilat; right shoulder also frozen.    No Known Allergies No outpatient medications have been marked as taking for the 04/07/19 encounter (Office Visit) with Caren Macadam, MD.   Social History    Tobacco Use  . Smoking status: Former Research scientist (life sciences)  . Smokeless tobacco: Never Used  . Tobacco comment: quit in her 16s, light smoker  Substance Use Topics  . Alcohol use: Yes    Alcohol/week: 3.0 standard drinks    Types: 3 Glasses of wine per week    Comment: occ   Family History  Problem Relation Age of Onset  . Diabetes Mother   . Liver disease Father   . Alcohol abuse Father   . Healthy Brother   . Stroke Sister 75     Review of Systems  Constitutional: Negative for chills, fatigue and fever.  Respiratory: Negative for cough, chest tightness, shortness of breath and wheezing.   Cardiovascular: Negative for chest pain, palpitations and leg swelling.    Objective:  Wt 194 lb (88 kg)   BMI 36.42 kg/m   Weight: 194 lb (88 kg)   BP Readings from Last 3 Encounters:  06/16/18 118/72  06/02/18 116/80  02/10/18 136/84   Wt Readings from Last 3 Encounters:  04/07/19 194 lb (88 kg)  11/19/18 188 lb 4.8 oz (85.4 kg)  06/16/18 193 lb 14.4 oz (88 kg)    EXAM:  GENERAL: alert, oriented, appears well and in no acute distress  HEENT: atraumatic, conjunctiva clear, no obvious abnormalities on inspection of external nose and ears  NECK: normal movements of the head and neck  LUNGS: on inspection no signs of respiratory distress, breathing rate appears normal, no obvious gross SOB, gasping  or wheezing  CV: no obvious cyanosis  MS: moves all visible extremities without noticeable abnormality  PSYCH/NEURO: pleasant and cooperative, no obvious depression or anxiety, speech and thought processing grossly intact  SKIN: no facial or neck abnormality  Assessment/Plan  1. Type 2 diabetes mellitus without complication, without long-term current use of insulin ([facility]) - Hemoglobin A1c; Future - Microalbumin / creatinine urine ratio; Future  2. Hyperlipidemia associated with type 2 diabetes mellitus ([place]) Will recheck lipids prior to next appointment. - Comprehensive metabolic  panel; Future - Lipid panel; Future  3. Essential hypertension Has been well controlled. Continue current medications.  - CBC with Differential/Platelet; Future - Comprehensive metabolic panel; Future  Return in about 3 months (around 07/07/2019) for physical exam.     I discussed the assessment and treatment plan with the patient. The patient was provided an opportunity to ask questions and all were answered. The patient agreed with the plan and demonstrated an understanding of the instructions.   The patient was advised to call back or seek an in-person evaluation if the symptoms worsen or if the condition fails to improve as anticipated.  I provided 25 minutes of non-face-to-face time during this encounter.   Micheline Rough, MD

## 2019-04-08 ENCOUNTER — Telehealth: Payer: Self-pay | Admitting: *Deleted

## 2019-04-08 NOTE — Telephone Encounter (Signed)
Left a detailed message at the pts cell number to schedule appts as below.

## 2019-04-08 NOTE — Telephone Encounter (Signed)
-----   Message from Caren Macadam, MD sent at 04/07/2019  3:35 PM EDT ----- Bloodwork followed by physical in December please

## 2019-06-13 ENCOUNTER — Other Ambulatory Visit: Payer: Self-pay | Admitting: Family Medicine

## 2019-06-21 ENCOUNTER — Other Ambulatory Visit: Payer: Self-pay

## 2019-06-22 ENCOUNTER — Other Ambulatory Visit (INDEPENDENT_AMBULATORY_CARE_PROVIDER_SITE_OTHER): Payer: 59

## 2019-06-22 ENCOUNTER — Other Ambulatory Visit: Payer: Self-pay

## 2019-06-22 DIAGNOSIS — I10 Essential (primary) hypertension: Secondary | ICD-10-CM

## 2019-06-22 DIAGNOSIS — E785 Hyperlipidemia, unspecified: Secondary | ICD-10-CM

## 2019-06-22 DIAGNOSIS — E119 Type 2 diabetes mellitus without complications: Secondary | ICD-10-CM

## 2019-06-22 DIAGNOSIS — E1169 Type 2 diabetes mellitus with other specified complication: Secondary | ICD-10-CM | POA: Diagnosis not present

## 2019-06-22 LAB — CBC WITH DIFFERENTIAL/PLATELET
Basophils Absolute: 0 10*3/uL (ref 0.0–0.1)
Basophils Relative: 0.6 % (ref 0.0–3.0)
Eosinophils Absolute: 0.1 10*3/uL (ref 0.0–0.7)
Eosinophils Relative: 2 % (ref 0.0–5.0)
HCT: 39.4 % (ref 36.0–46.0)
Hemoglobin: 13 g/dL (ref 12.0–15.0)
Lymphocytes Relative: 45.2 % (ref 12.0–46.0)
Lymphs Abs: 2.2 10*3/uL (ref 0.7–4.0)
MCHC: 33.1 g/dL (ref 30.0–36.0)
MCV: 96.3 fl (ref 78.0–100.0)
Monocytes Absolute: 0.3 10*3/uL (ref 0.1–1.0)
Monocytes Relative: 6.9 % (ref 3.0–12.0)
Neutro Abs: 2.2 10*3/uL (ref 1.4–7.7)
Neutrophils Relative %: 45.3 % (ref 43.0–77.0)
Platelets: 293 10*3/uL (ref 150.0–400.0)
RBC: 4.09 Mil/uL (ref 3.87–5.11)
RDW: 12.7 % (ref 11.5–15.5)
WBC: 4.9 10*3/uL (ref 4.0–10.5)

## 2019-06-22 LAB — MICROALBUMIN / CREATININE URINE RATIO
Creatinine,U: 149.8 mg/dL
Microalb Creat Ratio: 3.8 mg/g (ref 0.0–30.0)
Microalb, Ur: 5.7 mg/dL — ABNORMAL HIGH (ref 0.0–1.9)

## 2019-06-22 LAB — COMPREHENSIVE METABOLIC PANEL
ALT: 24 U/L (ref 0–35)
AST: 21 U/L (ref 0–37)
Albumin: 4.1 g/dL (ref 3.5–5.2)
Alkaline Phosphatase: 65 U/L (ref 39–117)
BUN: 14 mg/dL (ref 6–23)
CO2: 29 mEq/L (ref 19–32)
Calcium: 9.3 mg/dL (ref 8.4–10.5)
Chloride: 103 mEq/L (ref 96–112)
Creatinine, Ser: 0.89 mg/dL (ref 0.40–1.20)
GFR: 77.6 mL/min (ref >60.00)
Glucose, Bld: 162 mg/dL — ABNORMAL HIGH (ref 70–99)
Potassium: 4 mEq/L (ref 3.5–5.1)
Sodium: 139 mEq/L (ref 135–145)
Total Bilirubin: 0.8 mg/dL (ref 0.2–1.2)
Total Protein: 6.7 g/dL (ref 6.0–8.3)

## 2019-06-22 LAB — LIPID PANEL
Cholesterol: 159 mg/dL (ref 0–200)
HDL: 43.3 mg/dL (ref >39.00)
LDL Cholesterol: 84 mg/dL (ref 0–99)
NonHDL: 115.88
Total CHOL/HDL Ratio: 4
Triglycerides: 159 mg/dL — ABNORMAL HIGH (ref 0.0–149.0)
VLDL: 31.8 mg/dL (ref 0.0–40.0)

## 2019-06-22 LAB — HEMOGLOBIN A1C: Hgb A1c MFr Bld: 6.4 % (ref 4.6–6.5)

## 2019-06-28 ENCOUNTER — Other Ambulatory Visit: Payer: Self-pay

## 2019-06-28 ENCOUNTER — Ambulatory Visit (INDEPENDENT_AMBULATORY_CARE_PROVIDER_SITE_OTHER): Payer: 59 | Admitting: Family Medicine

## 2019-06-28 ENCOUNTER — Encounter: Payer: Self-pay | Admitting: Family Medicine

## 2019-06-28 VITALS — BP 140/90 | HR 76 | Temp 97.2°F | Ht 62.25 in | Wt 196.5 lb

## 2019-06-28 DIAGNOSIS — E1169 Type 2 diabetes mellitus with other specified complication: Secondary | ICD-10-CM | POA: Diagnosis not present

## 2019-06-28 DIAGNOSIS — E785 Hyperlipidemia, unspecified: Secondary | ICD-10-CM | POA: Diagnosis not present

## 2019-06-28 DIAGNOSIS — Z0000 Encounter for general adult medical examination without abnormal findings: Secondary | ICD-10-CM | POA: Diagnosis not present

## 2019-06-28 DIAGNOSIS — I10 Essential (primary) hypertension: Secondary | ICD-10-CM | POA: Diagnosis not present

## 2019-06-28 NOTE — Patient Instructions (Signed)
Try to limit carbohydrate choices (15g per choice) to 3 or less per meal.

## 2019-06-28 NOTE — Progress Notes (Signed)
Erica Sutton DOB: [birth year] Encounter date: 06/28/2019  This is a 64 y.o. female who presents for complete physical   History of present illness/Additional concerns: No specific concerns today.   HUO:HFGBMSXJDB, benazepril, hctz - Hasn't been checking at home.   HL: crestor - no muscle aches/cramps.   DM: diet controlled. Exercising regularly.   Last mammogram: normal 02/2019 Last colonoscopy: cologuard 04/2017 - due for repeat 05/21/2020   Past Medical History:  Diagnosis Date  . Anemia    intermittent her whole life, reports she had eval and was told to take MV  . Eclampsia   . GERD (gastroesophageal reflux disease) 08/03/2015   resolved 2016  . Hyperglycemia 02/19/2013  . Hyperlipidemia 02/19/2013  . Hypertension   . Obesity 08/03/2015   Past Surgical History:  Procedure Laterality Date  . ABDOMINAL HYSTERECTOMY     heavy menses. no concerns for cancer.   . CHOLECYSTECTOMY  11/10/12   lap chole with IOC  . shoulder shoulder Bilateral    rotator cuff bilat; right shoulder also frozen.    No Known Allergies Current Meds  Medication Sig  . Accu-Chek FastClix Lancets MISC Use as directed once a day  . ACCU-CHEK GUIDE test strip Use as instructed once a day  . amLODipine (NORVASC) 5 MG tablet TAKE 1 TABLET(5 MG) BY MOUTH DAILY  . benazepril (LOTENSIN) 20 MG tablet TAKE 1 TABLET(20 MG) BY MOUTH DAILY  . Blood Glucose Monitoring Suppl (ACCU-CHEK GUIDE ME) w/Device KIT 1 each by Does not apply route as directed.  . Blood Pressure Monitoring (BLOOD PRESSURE KIT) KIT Use as directed  . hydrochlorothiazide (HYDRODIURIL) 25 MG tablet TAKE 1 TABLET(25 MG) BY MOUTH DAILY  . Multiple Vitamins-Minerals (MULTIVITAMIN WITH MINERALS) tablet Take 1 tablet by mouth daily.  . rosuvastatin (CRESTOR) 10 MG tablet TAKE 1 TABLET(10 MG) BY MOUTH DAILY   Social History   Tobacco Use  . Smoking status: Former Research scientist (life sciences)  . Smokeless tobacco: Never Used  . Tobacco comment: quit in her 66s, light  smoker  Substance Use Topics  . Alcohol use: Yes    Alcohol/week: 3.0 standard drinks    Types: 3 Glasses of wine per week    Comment: occ   Family History  Problem Relation Age of Onset  . Diabetes Mother   . Liver disease Father   . Alcohol abuse Father   . Healthy Brother   . Stroke Sister 93     Review of Systems  Constitutional: Negative for activity change, appetite change, chills, fatigue, fever and unexpected weight change.  HENT: Negative for congestion, ear pain, hearing loss, sinus pressure, sinus pain, sore throat and trouble swallowing.   Eyes: Negative for pain and visual disturbance.  Respiratory: Negative for cough, chest tightness, shortness of breath and wheezing.   Cardiovascular: Negative for chest pain, palpitations and leg swelling.  Gastrointestinal: Negative for abdominal pain, blood in stool, constipation, diarrhea, nausea and vomiting.  Genitourinary: Negative for difficulty urinating and menstrual problem.  Musculoskeletal: Negative for arthralgias and back pain.  Skin: Negative for rash.  Neurological: Negative for dizziness, weakness, numbness and headaches.  Hematological: Negative for adenopathy. Does not bruise/bleed easily.  Psychiatric/Behavioral: Negative for sleep disturbance and suicidal ideas. The patient is not nervous/anxious.     CBC:  Lab Results  Component Value Date   WBC 4.9 06/22/2019   HGB 13.0 06/22/2019   HCT 39.4 06/22/2019   MCH 32.6 07/27/2013   MCHC 33.1 06/22/2019   RDW 12.7 06/22/2019  PLT 293.0 06/22/2019   CMP: Lab Results  Component Value Date   NA 139 06/22/2019   K 4.0 06/22/2019   CL 103 06/22/2019   CO2 29 06/22/2019   GLUCOSE 162 (H) 06/22/2019   GLUCOSE 100 (H) 07/25/2006   BUN 14 06/22/2019   CREATININE 0.89 06/22/2019   GFRAA 87 (L) 07/27/2013   CALCIUM 9.3 06/22/2019   PROT 6.7 06/22/2019   BILITOT 0.8 06/22/2019   ALKPHOS 65 06/22/2019   ALT 24 06/22/2019   AST 21 06/22/2019    LIPID: Lab Results  Component Value Date   CHOL 159 06/22/2019   TRIG 159.0 (H) 06/22/2019   TRIG 149 07/25/2006   HDL 43.30 06/22/2019   LDLCALC 84 06/22/2019    Objective:  BP 140/90 (BP Location: Left Arm, Patient Position: Sitting, Cuff Size: Large)   Pulse 76   Temp (!) 97.2 F (36.2 C) (Temporal)   Ht 5' 2.25" (1.581 m)   Wt 196 lb 8 oz (89.1 kg)   BMI 35.65 kg/m   Weight: 196 lb 8 oz (89.1 kg)   BP Readings from Last 3 Encounters:  06/28/19 140/90  06/16/18 118/72  06/02/18 116/80   Wt Readings from Last 3 Encounters:  06/28/19 196 lb 8 oz (89.1 kg)  04/07/19 194 lb (88 kg)  11/19/18 188 lb 4.8 oz (85.4 kg)    Physical Exam Constitutional:      General: She is not in acute distress.    Appearance: She is well-developed.  HENT:     Head: Normocephalic and atraumatic.     Right Ear: External ear normal.     Left Ear: External ear normal.     Mouth/Throat:     Pharynx: No oropharyngeal exudate.  Eyes:     Conjunctiva/sclera: Conjunctivae normal.     Pupils: Pupils are equal, round, and reactive to light.  Neck:     Musculoskeletal: Normal range of motion and neck supple.     Thyroid: No thyromegaly.  Cardiovascular:     Rate and Rhythm: Normal rate and regular rhythm.     Heart sounds: Normal heart sounds. No murmur. No friction rub. No gallop.   Pulmonary:     Effort: Pulmonary effort is normal.     Breath sounds: Normal breath sounds.  Abdominal:     General: Bowel sounds are normal. There is no distension.     Palpations: Abdomen is soft. There is no mass.     Tenderness: There is no abdominal tenderness. There is no guarding.     Hernia: No hernia is present.  Musculoskeletal: Normal range of motion.        General: No tenderness or deformity.  Lymphadenopathy:     Cervical: No cervical adenopathy.  Skin:    General: Skin is warm and dry.     Findings: No rash.     Comments: No concerning skin lesions noted on skin exam.  Neurological:      Mental Status: She is alert and oriented to person, place, and time.     Deep Tendon Reflexes: Reflexes normal.     Reflex Scores:      Tricep reflexes are 2+ on the right side and 2+ on the left side.      Bicep reflexes are 2+ on the right side and 2+ on the left side.      Brachioradialis reflexes are 2+ on the right side and 2+ on the left side.      Patellar reflexes  are 2+ on the right side and 2+ on the left side. Psychiatric:        Speech: Speech normal.        Behavior: Behavior normal.        Thought Content: Thought content normal.     Assessment/Plan: Health Maintenance Due  Topic Date Due  . FOOT EXAM  01/07/2019  . OPHTHALMOLOGY EXAM  04/22/2019   Health Maintenance reviewed -we will consider shingles vaccine..  1. Preventative health care We discussed importance of daily exercise and cutting back carbohydrates in diet, especially with elevating A1c.  She will consider the shingles vaccine, but is otherwise up-to-date with preventative healthcare measures.  2. Hyperlipidemia associated with type 2 diabetes mellitus ([place]) Continue statin.  We reviewed improvement in cholesterol with treatment.  3. Essential hypertension Blood pressure still elevated.  I have asked her to check at home and report back in 2 weeks time.  Return for Pending blood pressure report in 2 weeks time.Micheline Rough, MD

## 2019-09-09 ENCOUNTER — Ambulatory Visit: Payer: 59

## 2019-09-13 ENCOUNTER — Ambulatory Visit: Payer: 59 | Attending: Family

## 2019-09-13 DIAGNOSIS — Z23 Encounter for immunization: Secondary | ICD-10-CM | POA: Insufficient documentation

## 2019-09-13 NOTE — Progress Notes (Signed)
   Covid-19 Vaccination Clinic  Name:  Erica Sutton    MRN: [id number] DOB: [birth year]  09/13/2019  [name] was observed post Covid-19 immunization for 15 minutes without incidence. She was provided with Vaccine Information Sheet and instruction to access the V-Safe system.   [name] was instructed to call 911 with any severe reactions post vaccine: Marland Kitchen Difficulty breathing  . Swelling of your face and throat  . A fast heartbeat  . A bad rash all over your body  . Dizziness and weakness    Immunizations Administered    Name Date Dose VIS Date Route   Moderna COVID-19 Vaccine 09/13/2019 11:44 AM 0.5 mL 06/22/2019 Intramuscular   Manufacturer: Moderna   Lot: [id number]   [place][id number]

## 2019-09-28 ENCOUNTER — Other Ambulatory Visit: Payer: Self-pay

## 2019-09-29 ENCOUNTER — Ambulatory Visit (INDEPENDENT_AMBULATORY_CARE_PROVIDER_SITE_OTHER): Payer: 59 | Admitting: Family Medicine

## 2019-09-29 ENCOUNTER — Encounter: Payer: Self-pay | Admitting: Family Medicine

## 2019-09-29 VITALS — BP 124/82 | HR 72 | Temp 97.9°F | Ht 62.25 in | Wt 196.7 lb

## 2019-09-29 DIAGNOSIS — R739 Hyperglycemia, unspecified: Secondary | ICD-10-CM

## 2019-09-29 DIAGNOSIS — L659 Nonscarring hair loss, unspecified: Secondary | ICD-10-CM

## 2019-09-29 DIAGNOSIS — D229 Melanocytic nevi, unspecified: Secondary | ICD-10-CM | POA: Diagnosis not present

## 2019-09-29 DIAGNOSIS — I10 Essential (primary) hypertension: Secondary | ICD-10-CM

## 2019-09-29 DIAGNOSIS — E785 Hyperlipidemia, unspecified: Secondary | ICD-10-CM

## 2019-09-29 DIAGNOSIS — G629 Polyneuropathy, unspecified: Secondary | ICD-10-CM

## 2019-09-29 LAB — VITAMIN D 25 HYDROXY (VIT D DEFICIENCY, FRACTURES): VITD: 34.68 ng/mL (ref 30.00–100.00)

## 2019-09-29 LAB — TSH: TSH: 0.65 u[IU]/mL (ref 0.35–4.50)

## 2019-09-29 LAB — HEMOGLOBIN A1C: Hgb A1c MFr Bld: 6.6 % — ABNORMAL HIGH (ref 4.6–6.5)

## 2019-09-29 LAB — VITAMIN B12: Vitamin B-12: 569 pg/mL (ref 211–911)

## 2019-09-29 NOTE — Progress Notes (Signed)
Erica Sutton DOB: [birth year] Encounter date: 09/29/2019  This is a 64 y.o. female who presents with Chief Complaint  Patient presents with  . Follow-up    History of present illness:  HCW:CBJSEGBTDVV 33m daily, lotensin 280mdaily, hctz 2532maily. Blood pressure has been similar to today's readings.   HL:OH:YWVPXTGm37mily. Doing ok with this.   Hyperglycemia: continuing to work on healthy eating. Has Tremain and bad days.   No Known Allergies Current Meds  Medication Sig  . Accu-Chek FastClix Lancets MISC Use as directed once a day  . ACCU-CHEK GUIDE test strip Use as instructed once a day  . amLODipine (NORVASC) 5 MG tablet TAKE 1 TABLET(5 MG) BY MOUTH DAILY  . benazepril (LOTENSIN) 20 MG tablet TAKE 1 TABLET(20 MG) BY MOUTH DAILY  . Blood Glucose Monitoring Suppl (ACCU-CHEK GUIDE ME) w/Device KIT 1 each by Does not apply route as directed.  . Blood Pressure Monitoring (BLOOD PRESSURE KIT) KIT Use as directed  . hydrochlorothiazide (HYDRODIURIL) 25 MG tablet TAKE 1 TABLET(25 MG) BY MOUTH DAILY  . Multiple Vitamins-Minerals (MULTIVITAMIN WITH MINERALS) tablet Take 1 tablet by mouth daily.  . rosuvastatin (CRESTOR) 10 MG tablet TAKE 1 TABLET(10 MG) BY MOUTH DAILY    Review of Systems  Constitutional: Negative for chills, fatigue and fever.  Respiratory: Negative for cough, chest tightness, shortness of breath and wheezing.   Cardiovascular: Negative for chest pain, palpitations and leg swelling.  Skin:       Occasional moles; would like to see derm to follow regularly  Has noted some increased hair loss in the last year.  Notes this more it upper occiput    Objective:  BP 124/82 (BP Location: Left Arm, Patient Position: Sitting, Cuff Size: Large)   Pulse 72   Temp 97.9 F (36.6 C) (Temporal)   Ht 5' 2.25" (1.581 m)   Wt 196 lb 11.2 oz (89.2 kg)   SpO2 98%   BMI 35.69 kg/m   Weight: 196 lb 11.2 oz (89.2 kg)   BP Readings from Last 3 Encounters:  09/29/19 124/82   06/28/19 140/90  06/16/18 118/72   Wt Readings from Last 3 Encounters:  09/29/19 196 lb 11.2 oz (89.2 kg)  06/28/19 196 lb 8 oz (89.1 kg)  04/07/19 194 lb (88 kg)    Physical Exam Constitutional:      General: She is not in acute distress.    Appearance: She is well-developed.  Cardiovascular:     Rate and Rhythm: Normal rate and regular rhythm.     Heart sounds: Normal heart sounds. No murmur. No friction rub.  Pulmonary:     Effort: Pulmonary effort is normal. No respiratory distress.     Breath sounds: Normal breath sounds. No wheezing or rales.  Musculoskeletal:     Right lower leg: No edema.     Left lower leg: No edema.  Neurological:     Mental Status: She is alert and oriented to person, place, and time.  Psychiatric:        Behavior: Behavior normal.     Assessment/Plan  1. Essential hypertension Improved control.  Continue to monitor at home.  Continue current medications.  2. Hyperlipidemia, unspecified hyperlipidemia type Has been well controlled.  Continue with Crestor 10 mg. - TSH; Future - TSH  3. Numerous skin moles - Ambulatory referral to Dermatology  4. Hyperglycemia We reviewed diabetic diet today and watching carbohydrate intake.  She is working on healThe [employer] Corporatione discussed importance  of regular exercise. - Hemoglobin A1c; Future - Hemoglobin A1c - Hemoglobin A1c; Future  5. Neuropathy: at end of visit mentions intermittent funny sensation in toes that occurs intermittently and at end of day. This is something new for her in the last year that she has noticed more.  It is not a daily symptom, but she mentions that she is having some slight numbness in the feet at the end of the day.  6. Hair loss Will get some basic labs today. Can also discuss with derm at follow up. - Vitamin B12; Future - VITAMIN D 25 Hydroxy (Vit-D Deficiency, Fractures); Future - VITAMIN D 25 Hydroxy (Vit-D Deficiency, Fractures) - Vitamin B12   Return in  about 6 months (around 03/31/2020) for physical exam.    Micheline Rough, MD

## 2019-10-19 ENCOUNTER — Ambulatory Visit: Payer: 59 | Attending: Family

## 2019-10-19 DIAGNOSIS — Z23 Encounter for immunization: Secondary | ICD-10-CM

## 2019-10-19 NOTE — Progress Notes (Signed)
   Covid-19 Vaccination Clinic  Name:  Erica Sutton    MRN: [id number] DOB: [birth year]  10/19/2019  Erica Sutton was observed post Covid-19 immunization for 15 minutes without incident. She was provided with Vaccine Information Sheet and instruction to access the V-Safe system.   Erica Sutton was instructed to call 911 with any severe reactions post vaccine: Marland Kitchen Difficulty breathing  . Swelling of face and throat  . A fast heartbeat  . A bad rash all over body  . Dizziness and weakness   Immunizations Administered    Name Date Dose VIS Date Route   Moderna COVID-19 Vaccine 10/19/2019 10:06 AM 0.5 mL 06/22/2019 Intramuscular   Manufacturer: Moderna   LotFP:[id number]   [place]:[id number]

## 2019-11-17 ENCOUNTER — Other Ambulatory Visit: Payer: Self-pay | Admitting: Family Medicine

## 2019-11-18 ENCOUNTER — Other Ambulatory Visit: Payer: Self-pay | Admitting: *Deleted

## 2019-11-18 MED ORDER — BENAZEPRIL HCL 20 MG PO TABS: ORAL_TABLET | ORAL | 0 refills | Status: DC

## 2020-02-27 ENCOUNTER — Other Ambulatory Visit: Payer: Self-pay | Admitting: Family Medicine

## 2020-03-03 ENCOUNTER — Other Ambulatory Visit: Payer: Self-pay | Admitting: Family Medicine

## 2020-03-03 DIAGNOSIS — E119 Type 2 diabetes mellitus without complications: Secondary | ICD-10-CM

## 2020-03-09 ENCOUNTER — Other Ambulatory Visit: Payer: Self-pay | Admitting: Family Medicine

## 2020-03-09 DIAGNOSIS — Z1231 Encounter for screening mammogram for malignant neoplasm of breast: Secondary | ICD-10-CM

## 2020-03-15 ENCOUNTER — Ambulatory Visit: Payer: 59

## 2020-03-16 ENCOUNTER — Other Ambulatory Visit: Payer: Self-pay

## 2020-03-16 ENCOUNTER — Ambulatory Visit
Admission: RE | Admit: 2020-03-16 | Discharge: 2020-03-16 | Disposition: A | Discharge status: Home / Self Care | Payer: 59 | Source: Ambulatory Visit | Attending: Family Medicine | Admitting: Family Medicine

## 2020-03-16 DIAGNOSIS — Z1231 Encounter for screening mammogram for malignant neoplasm of breast: Secondary | ICD-10-CM

## 2020-04-03 ENCOUNTER — Other Ambulatory Visit: Payer: Self-pay

## 2020-04-03 ENCOUNTER — Other Ambulatory Visit (INDEPENDENT_AMBULATORY_CARE_PROVIDER_SITE_OTHER): Payer: 59

## 2020-04-03 DIAGNOSIS — E119 Type 2 diabetes mellitus without complications: Secondary | ICD-10-CM

## 2020-04-04 LAB — HEMOGLOBIN A1C
Hgb A1c MFr Bld: 6.4 % of total Hgb — ABNORMAL HIGH (ref <5.7)
Mean Plasma Glucose: 137 (calc)
eAG (mmol/L): 7.6 (calc)

## 2020-06-05 ENCOUNTER — Other Ambulatory Visit: Payer: Self-pay | Admitting: Family Medicine

## 2020-06-29 ENCOUNTER — Ambulatory Visit: Payer: 59 | Attending: Critical Care Medicine

## 2020-06-29 DIAGNOSIS — Z23 Encounter for immunization: Secondary | ICD-10-CM

## 2020-06-29 NOTE — Progress Notes (Signed)
   Covid-19 Vaccination Clinic  Name:  Erica Sutton    MRN: [id number] DOB: 10-21-1955  06/29/2020  Erica Sutton was observed post Covid-19 immunization for 15 minutes without incident. She was provided with Vaccine Information Sheet and instruction to access the V-Safe system.   Erica Sutton was instructed to call 911 with any severe reactions post vaccine: Marland Kitchen Difficulty breathing  . Swelling of face and throat  . A fast heartbeat  . A bad rash all over body  . Dizziness and weakness   Immunizations Administered    No immunizations on file.

## 2020-06-30 ENCOUNTER — Other Ambulatory Visit: Payer: Self-pay

## 2020-06-30 ENCOUNTER — Encounter: Payer: Self-pay | Admitting: Family Medicine

## 2020-06-30 ENCOUNTER — Ambulatory Visit (INDEPENDENT_AMBULATORY_CARE_PROVIDER_SITE_OTHER): Payer: 59 | Admitting: Family Medicine

## 2020-06-30 VITALS — BP 150/100 | HR 80 | Temp 97.8°F | Ht 62.25 in | Wt 194.3 lb

## 2020-06-30 DIAGNOSIS — E785 Hyperlipidemia, unspecified: Secondary | ICD-10-CM

## 2020-06-30 DIAGNOSIS — Z0000 Encounter for general adult medical examination without abnormal findings: Secondary | ICD-10-CM | POA: Diagnosis not present

## 2020-06-30 DIAGNOSIS — I10 Essential (primary) hypertension: Secondary | ICD-10-CM

## 2020-06-30 DIAGNOSIS — E1169 Type 2 diabetes mellitus with other specified complication: Secondary | ICD-10-CM

## 2020-06-30 DIAGNOSIS — Z1211 Encounter for screening for malignant neoplasm of colon: Secondary | ICD-10-CM

## 2020-06-30 DIAGNOSIS — B351 Tinea unguium: Secondary | ICD-10-CM

## 2020-06-30 DIAGNOSIS — E119 Type 2 diabetes mellitus without complications: Secondary | ICD-10-CM

## 2020-06-30 MED ORDER — BENAZEPRIL HCL 20 MG PO TABS
40.0000 mg | ORAL_TABLET | Freq: Every day | ORAL | 0 refills | Status: DC
Start: 2020-06-30 — End: 2020-10-16

## 2020-06-30 MED ORDER — CICLOPIROX 8 % EX SOLN: Freq: Every day | CUTANEOUS | 5 refills | Status: DC

## 2020-06-30 NOTE — Patient Instructions (Signed)
Increase benazepril to 2 tabs daily (totala 40mg ); continue other meds.  Update through mychart with pressures in 3 weeks time.

## 2020-06-30 NOTE — Progress Notes (Signed)
Erica Sutton DOB: 21-May-1956 Encounter date: 06/30/2020  This is a 64 y.o. female who presents for complete physical   History of present illness/Additional concerns: Last visit was 9 months ago.   No specific concerns today.   FMB:WGYKZLDJTTS 27m daily, lotensin 298mdaily, hctz 2524maily. Has been checking at home; hasn't been as high as today. Usually getting around 130/82.   HL:VX:BLTJQZEm17mily. Doing ok with this.   Hyperglycemia: like a roller coaster. Some days better than others. Feels like she slacks on the exercise but usually gets in at least 3-4 days/week. Works out at gym and has support group for this; does a routine at the gym; sometimes set up by them.   Will get shingles vaccination in near future; just wants vaccine break right now.   Had eye exam in 2020. Doesn't remember date of this.    Used to see derm; but hasn't lately.  Does see Dr. PinnAlwyn Pea gyn needs. Just was there a couple of weeks ago. Had pap that day. Had mammogram 03/20/20. Had sharp abd pain and had spotting and saw Dr. PinnAlwyn Peaer that. She suggested that patient see gastro. She has not had any spotting or pain since that episode. Generally when it occurs she gets pain more than spotting. Pain is sudden, sharp and just lasts a few seconds. Feels it suprapubic when it occurs. Hasn't had issue for whole month. Did have US cKoreapleted at that time.   Past Medical History:  Diagnosis Date  . Anemia    intermittent her whole life, reports she had eval and was told to take MV  . Eclampsia   . GERD (gastroesophageal reflux disease) 08/03/2015   resolved 2016  . Hyperglycemia 02/19/2013  . Hyperlipidemia 02/19/2013  . Hypertension   . Obesity 08/03/2015   Past Surgical History:  Procedure Laterality Date  . ABDOMINAL HYSTERECTOMY     heavy menses. no concerns for cancer.   . CHOLECYSTECTOMY  11/10/12   lap chole with IOC  . shoulder shoulder Bilateral    rotator cuff bilat; right shoulder also frozen.     No Known Allergies Current Meds  Medication Sig  . Accu-Chek FastClix Lancets MISC Use as directed once a day  . ACCU-CHEK GUIDE test strip Use as instructed once a day  . amLODipine (NORVASC) 5 MG tablet TAKE 1 TABLET(5 MG) BY MOUTH DAILY  . Blood Glucose Monitoring Suppl (ACCU-CHEK GUIDE ME) w/Device KIT 1 each by Does not apply route as directed.  . Blood Pressure Monitoring (BLOOD PRESSURE KIT) KIT Use as directed  . hydrochlorothiazide (HYDRODIURIL) 25 MG tablet TAKE 1 TABLET(25 MG) BY MOUTH DAILY  . Multiple Vitamins-Minerals (MULTIVITAMIN WITH MINERALS) tablet Take 1 tablet by mouth daily.  . rosuvastatin (CRESTOR) 10 MG tablet TAKE 1 TABLET(10 MG) BY MOUTH DAILY  . [DISCONTINUED] benazepril (LOTENSIN) 20 MG tablet TAKE 1 TABLET(20 MG) BY MOUTH DAILY   Social History   Tobacco Use  . Smoking status: Former [employer] scientist (life sciences)Smokeless tobacco: Never Used  . Tobacco comment: quit in her 20s,69sght smoker  Substance Use Topics  . Alcohol use: Yes    Alcohol/week: 3.0 standard drinks    Types: 3 Glasses of wine per week    Comment: occ   Family History  Problem Relation Age of Onset  . Diabetes Mother   . Liver disease Father   . Alcohol abuse Father   . Healthy Brother   . Stroke Sister [id number]  Review of Systems  Constitutional: Negative for activity change, appetite change, chills, fatigue, fever and unexpected weight change.  HENT: Negative for congestion, ear pain, hearing loss, sinus pressure, sinus pain, sore throat and trouble swallowing.   Eyes: Negative for pain and visual disturbance.  Respiratory: Negative for cough, chest tightness, shortness of breath and wheezing.   Cardiovascular: Negative for chest pain, palpitations and leg swelling.  Gastrointestinal: Negative for abdominal pain, blood in stool, constipation, diarrhea, nausea and vomiting.  Genitourinary: Negative for difficulty urinating and menstrual problem.  Musculoskeletal: Negative for arthralgias and  back pain.  Skin: Negative for rash.  Neurological: Negative for dizziness, weakness, numbness and headaches.  Hematological: Negative for adenopathy. Does not bruise/bleed easily.  Psychiatric/Behavioral: Negative for sleep disturbance and suicidal ideas. The patient is not nervous/anxious.     CBC:  Lab Results  Component Value Date   WBC 4.9 06/22/2019   HGB 13.0 06/22/2019   HCT 39.4 06/22/2019   MCH 32.6 07/27/2013   MCHC 33.1 06/22/2019   RDW 12.7 06/22/2019   PLT 293.0 06/22/2019   CMP: Lab Results  Component Value Date   NA 139 06/22/2019   K 4.0 06/22/2019   CL 103 06/22/2019   CO2 29 06/22/2019   GLUCOSE 162 (H) 06/22/2019   GLUCOSE 100 (H) 07/25/2006   BUN 14 06/22/2019   CREATININE 0.89 06/22/2019   GFRAA 87 (L) 07/27/2013   CALCIUM 9.3 06/22/2019   PROT 6.7 06/22/2019   BILITOT 0.8 06/22/2019   ALKPHOS 65 06/22/2019   ALT 24 06/22/2019   AST 21 06/22/2019   LIPID: Lab Results  Component Value Date   CHOL 159 06/22/2019   TRIG 159.0 (H) 06/22/2019   TRIG 149 07/25/2006   HDL 43.30 06/22/2019   LDLCALC 84 06/22/2019    Objective:  BP (!) 150/100   Pulse 80   Temp 97.8 F (36.6 C) (Oral)   Ht 5' 2.25" (1.581 m)   Wt 194 lb 4.8 oz (88.1 kg)   BMI 35.25 kg/m   Weight: 194 lb 4.8 oz (88.1 kg)   BP Readings from Last 3 Encounters:  06/30/20 (!) 150/100  09/29/19 124/82  06/28/19 140/90   Wt Readings from Last 3 Encounters:  06/30/20 194 lb 4.8 oz (88.1 kg)  09/29/19 196 lb 11.2 oz (89.2 kg)  06/28/19 196 lb 8 oz (89.1 kg)    Physical Exam Constitutional:      General: She is not in acute distress.    Appearance: She is well-developed and well-nourished.  HENT:     Head: Normocephalic and atraumatic.     Right Ear: External ear normal.     Left Ear: External ear normal.     Mouth/Throat:     Mouth: Oropharynx is clear and moist.     Pharynx: No oropharyngeal exudate.  Eyes:     Conjunctiva/sclera: Conjunctivae normal.     Pupils:  Pupils are equal, round, and reactive to light.  Neck:     Thyroid: No thyromegaly.  Cardiovascular:     Rate and Rhythm: Normal rate and regular rhythm.     Heart sounds: Normal heart sounds. No murmur heard. No friction rub. No gallop.   Pulmonary:     Effort: Pulmonary effort is normal.     Breath sounds: Normal breath sounds.  Abdominal:     General: Bowel sounds are normal. There is no distension.     Palpations: Abdomen is soft. There is no mass.     Tenderness:  There is no abdominal tenderness. There is no guarding.     Hernia: No hernia is present.  Musculoskeletal:        General: No tenderness, deformity or edema. Normal range of motion.     Cervical back: Normal range of motion and neck supple.  Lymphadenopathy:     Cervical: No cervical adenopathy.  Skin:    General: Skin is warm and dry.     Findings: No rash.     Comments: Multiple skin moles; none appear concerning. She is aware most have been present for years without change.  Thickening bilat toenails both feet - great toe and 5th toe most prominent. Normal sensation of feet.  Neurological:     Mental Status: She is alert and oriented to person, place, and time.     Deep Tendon Reflexes: Strength normal. Reflexes normal.     Reflex Scores:      Tricep reflexes are 2+ on the right side and 2+ on the left side.      Bicep reflexes are 2+ on the right side and 2+ on the left side.      Brachioradialis reflexes are 2+ on the right side and 2+ on the left side.      Patellar reflexes are 2+ on the right side and 2+ on the left side. Psychiatric:        Mood and Affect: Mood and affect normal.        Speech: Speech normal.        Behavior: Behavior normal.        Thought Content: Thought content normal.     Assessment/Plan: Health Maintenance Due  Topic Date Due  . OPHTHALMOLOGY EXAM  04/22/2019  . Fecal DNA (Cologuard)  05/21/2020   Health Maintenance reviewed - will consider shingrix at later date. Had  covid booster yesterday.  . Increase benazepril to 2 tabs daily (totala 4m); continue other meds.  Update through mychart with pressures in 3 weeks time.   1. Preventative health care Keep up with exercise and healthy eating. Up to date with other preventative health needs, except cologuard. We discussed cologuard versus colonoscopy. Since she has not had recurrence of abdominal pain or spotting in last month; we will proceed with cologuard. We will change to colonoscopy if abd pain recurs. She will notify me.   2. Screening for colon cancer See above.  - Cologuard; Future  3. Essential hypertension Increase benazepril to 453mdaily. Continue with amlodipine 53m53maily, hctz 253m37mily. Update me in 3 weeks with pressures.  - CBC with Differential/Platelet; Future - Comprehensive metabolic panel; Future - Comprehensive metabolic panel - CBC with Differential/Platelet  4. Hyperlipidemia associated with type 2 diabetes mellitus ([facility])[place] crest[place] 10mg83myl. - Lipid panel; Future - Lipid panel  5. Type 2 diabetes mellitus without complication, without long-term current use of insulin ([facility]) [place] been diet controlled. Recheck bloodwork today.  - Hemoglobin A1c; Future - Microalbumin / creatinine urine ratio; Future - [facility] DIABETES FOOT EXAM - Hemoglobin A1c - Microalbumin / creatinine urine ratio  6. Onychomycosis - penlac trial.  Return in about 3 months (around 09/28/2020) for Chronic condition visit.  JunelMicheline Rough

## 2020-07-01 LAB — COMPREHENSIVE METABOLIC PANEL
AG Ratio: 1.5 (calc) (ref 1.0–2.5)
ALT: 20 U/L (ref 6–29)
AST: 19 U/L (ref 10–35)
Albumin: 4.5 g/dL (ref 3.6–5.1)
Alkaline phosphatase (APISO): 68 U/L (ref 37–153)
BUN: 13 mg/dL (ref 7–25)
CO2: 32 mmol/L (ref 20–32)
Calcium: 10.4 mg/dL (ref 8.6–10.4)
Chloride: 99 mmol/L (ref 98–110)
Creat: 0.95 mg/dL (ref 0.50–0.99)
Globulin: 3.1 g/dL (calc) (ref 1.9–3.7)
Glucose, Bld: 138 mg/dL — ABNORMAL HIGH (ref 65–99)
Potassium: 4.4 mmol/L (ref 3.5–5.3)
Sodium: 138 mmol/L (ref 135–146)
Total Bilirubin: 0.9 mg/dL (ref 0.2–1.2)
Total Protein: 7.6 g/dL (ref 6.1–8.1)

## 2020-07-01 LAB — LIPID PANEL
Cholesterol: 187 mg/dL (ref <200)
HDL: 57 mg/dL (ref >=50)
LDL Cholesterol (Calc): 102 mg/dL (calc) — ABNORMAL HIGH
Non-HDL Cholesterol (Calc): 130 mg/dL (calc) — ABNORMAL HIGH (ref <130)
Total CHOL/HDL Ratio: 3.3 (calc) (ref <5.0)
Triglycerides: 166 mg/dL — ABNORMAL HIGH (ref <150)

## 2020-07-01 LAB — CBC WITH DIFFERENTIAL/PLATELET
Absolute Monocytes: 407 cells/uL (ref 200–950)
Basophils Absolute: 20 cells/uL (ref 0–200)
Basophils Relative: 0.4 %
Eosinophils Absolute: 108 cells/uL (ref 15–500)
Eosinophils Relative: 2.2 %
HCT: 42.2 % (ref 35.0–45.0)
Hemoglobin: 14.3 g/dL (ref 11.7–15.5)
Lymphs Abs: 1352 cells/uL (ref 850–3900)
MCH: 32.2 pg (ref 27.0–33.0)
MCHC: 33.9 g/dL (ref 32.0–36.0)
MCV: 95 fL (ref 80.0–100.0)
MPV: 10.6 fL (ref 7.5–12.5)
Monocytes Relative: 8.3 %
Neutro Abs: 3014 cells/uL (ref 1500–7800)
Neutrophils Relative %: 61.5 %
Platelets: 333 10*3/uL (ref 140–400)
RBC: 4.44 10*6/uL (ref 3.80–5.10)
RDW: 11.4 % (ref 11.0–15.0)
Total Lymphocyte: 27.6 %
WBC: 4.9 10*3/uL (ref 3.8–10.8)

## 2020-07-01 LAB — HEMOGLOBIN A1C
Hgb A1c MFr Bld: 6.2 % of total Hgb — ABNORMAL HIGH (ref <5.7)
Mean Plasma Glucose: 131 mg/dL
eAG (mmol/L): 7.3 mmol/L

## 2020-07-01 LAB — MICROALBUMIN / CREATININE URINE RATIO
Creatinine, Urine: 118 mg/dL (ref 20–275)
Microalb Creat Ratio: 17 mcg/mg creat (ref <30)
Microalb, Ur: 2 mg/dL

## 2020-07-05 ENCOUNTER — Other Ambulatory Visit: Payer: Self-pay | Admitting: Family Medicine

## 2020-07-12 MED ORDER — ROSUVASTATIN CALCIUM 20 MG PO TABS: 20.0000 mg | ORAL_TABLET | Freq: Every day | ORAL | 5 refills | Status: DC

## 2020-07-12 NOTE — Addendum Note (Signed)
Addended by: Agnes Lawrence on: 07/12/2020 01:47 PM   Modules accepted: Orders

## 2020-08-17 ENCOUNTER — Telehealth: Payer: Self-pay | Admitting: Family Medicine

## 2020-08-17 NOTE — Telephone Encounter (Signed)
Spoke with the pt and apologized for the delay.  Patient is aware the order was faxed to [facility] at [phone] and to call back if she has not received a kit in a few weeks.

## 2020-08-17 NOTE — Telephone Encounter (Signed)
Pt is calling in to see if her cologuard test was ordered.  Pt would like to have a call back.

## 2020-09-01 NOTE — Addendum Note (Signed)
Addended by: Marrion Coy on: 09/01/2020 03:11 PM   Modules accepted: Orders

## 2020-09-05 LAB — COLOGUARD: Cologuard: NEGATIVE

## 2020-09-11 LAB — COLOGUARD: COLOGUARD: NEGATIVE

## 2020-09-12 ENCOUNTER — Encounter: Payer: Self-pay | Admitting: Family Medicine

## 2020-10-13 ENCOUNTER — Other Ambulatory Visit: Payer: Self-pay

## 2020-10-13 ENCOUNTER — Other Ambulatory Visit (INDEPENDENT_AMBULATORY_CARE_PROVIDER_SITE_OTHER): Payer: 59

## 2020-10-13 DIAGNOSIS — E1169 Type 2 diabetes mellitus with other specified complication: Secondary | ICD-10-CM | POA: Diagnosis not present

## 2020-10-13 DIAGNOSIS — E785 Hyperlipidemia, unspecified: Secondary | ICD-10-CM

## 2020-10-13 LAB — LIPID PANEL
Cholesterol: 138 mg/dL (ref 0–200)
HDL: 43.4 mg/dL (ref >39.00)
LDL Cholesterol: 66 mg/dL (ref 0–99)
NonHDL: 94.27
Total CHOL/HDL Ratio: 3
Triglycerides: 143 mg/dL (ref 0.0–149.0)
VLDL: 28.6 mg/dL (ref 0.0–40.0)

## 2020-10-14 ENCOUNTER — Other Ambulatory Visit: Payer: Self-pay | Admitting: Family Medicine

## 2021-01-30 ENCOUNTER — Other Ambulatory Visit: Payer: Self-pay | Admitting: Family Medicine

## 2021-03-01 ENCOUNTER — Other Ambulatory Visit: Payer: Self-pay | Admitting: Family Medicine

## 2021-05-04 ENCOUNTER — Other Ambulatory Visit: Payer: Self-pay | Admitting: Obstetrics & Gynecology

## 2021-05-04 DIAGNOSIS — Z1231 Encounter for screening mammogram for malignant neoplasm of breast: Secondary | ICD-10-CM

## 2021-05-08 ENCOUNTER — Other Ambulatory Visit: Payer: Self-pay | Admitting: Family Medicine

## 2021-05-31 ENCOUNTER — Ambulatory Visit: Payer: 59

## 2021-06-25 ENCOUNTER — Other Ambulatory Visit: Payer: Self-pay | Admitting: Family Medicine

## 2021-07-05 ENCOUNTER — Ambulatory Visit
Admission: RE | Admit: 2021-07-05 | Discharge: 2021-07-05 | Disposition: A | Discharge status: Home / Self Care | Payer: 59 | Source: Ambulatory Visit | Attending: Obstetrics & Gynecology | Admitting: Obstetrics & Gynecology

## 2021-07-05 ENCOUNTER — Other Ambulatory Visit: Payer: Self-pay

## 2021-07-05 DIAGNOSIS — Z1231 Encounter for screening mammogram for malignant neoplasm of breast: Secondary | ICD-10-CM

## 2021-07-09 ENCOUNTER — Encounter: Payer: Self-pay | Admitting: Family Medicine

## 2021-07-09 ENCOUNTER — Ambulatory Visit (INDEPENDENT_AMBULATORY_CARE_PROVIDER_SITE_OTHER): Payer: 59 | Admitting: Family Medicine

## 2021-07-09 VITALS — BP 130/90 | HR 74 | Temp 97.7°F | Ht 64.0 in | Wt 191.5 lb

## 2021-07-09 DIAGNOSIS — R739 Hyperglycemia, unspecified: Secondary | ICD-10-CM

## 2021-07-09 DIAGNOSIS — Z0000 Encounter for general adult medical examination without abnormal findings: Secondary | ICD-10-CM

## 2021-07-09 DIAGNOSIS — I10 Essential (primary) hypertension: Secondary | ICD-10-CM

## 2021-07-09 DIAGNOSIS — E1169 Type 2 diabetes mellitus with other specified complication: Secondary | ICD-10-CM

## 2021-07-09 DIAGNOSIS — E785 Hyperlipidemia, unspecified: Secondary | ICD-10-CM

## 2021-07-09 LAB — LIPID PANEL
Cholesterol: 141 mg/dL (ref 0–200)
HDL: 46.2 mg/dL (ref >39.00)
LDL Cholesterol: 67 mg/dL (ref 0–99)
NonHDL: 94.9
Total CHOL/HDL Ratio: 3
Triglycerides: 138 mg/dL (ref 0.0–149.0)
VLDL: 27.6 mg/dL (ref 0.0–40.0)

## 2021-07-09 LAB — COMPREHENSIVE METABOLIC PANEL
ALT: 27 U/L (ref 0–35)
AST: 31 U/L (ref 0–37)
Albumin: 4.5 g/dL (ref 3.5–5.2)
Alkaline Phosphatase: 72 U/L (ref 39–117)
BUN: 14 mg/dL (ref 6–23)
CO2: 30 mEq/L (ref 19–32)
Calcium: 10.2 mg/dL (ref 8.4–10.5)
Chloride: 98 mEq/L (ref 96–112)
Creatinine, Ser: 0.9 mg/dL (ref 0.40–1.20)
GFR: 67.48 mL/min (ref >60.00)
Glucose, Bld: 155 mg/dL — ABNORMAL HIGH (ref 70–99)
Potassium: 4 mEq/L (ref 3.5–5.1)
Sodium: 136 mEq/L (ref 135–145)
Total Bilirubin: 0.9 mg/dL (ref 0.2–1.2)
Total Protein: 8.2 g/dL (ref 6.0–8.3)

## 2021-07-09 LAB — CBC WITH DIFFERENTIAL/PLATELET
Basophils Absolute: 0.1 10*3/uL (ref 0.0–0.1)
Basophils Relative: 1.3 % (ref 0.0–3.0)
Eosinophils Absolute: 0.2 10*3/uL (ref 0.0–0.7)
Eosinophils Relative: 3.9 % (ref 0.0–5.0)
HCT: 41.7 % (ref 36.0–46.0)
Hemoglobin: 13.9 g/dL (ref 12.0–15.0)
Lymphocytes Relative: 33.6 % (ref 12.0–46.0)
Lymphs Abs: 2 10*3/uL (ref 0.7–4.0)
MCHC: 33.4 g/dL (ref 30.0–36.0)
MCV: 95.4 fl (ref 78.0–100.0)
Monocytes Absolute: 0.4 10*3/uL (ref 0.1–1.0)
Monocytes Relative: 7.2 % (ref 3.0–12.0)
Neutro Abs: 3.3 10*3/uL (ref 1.4–7.7)
Neutrophils Relative %: 54 % (ref 43.0–77.0)
Platelets: 340 10*3/uL (ref 150.0–400.0)
RBC: 4.37 Mil/uL (ref 3.87–5.11)
RDW: 12.7 % (ref 11.5–15.5)
WBC: 6.1 10*3/uL (ref 4.0–10.5)

## 2021-07-09 NOTE — Patient Instructions (Addendum)
Double up on the benazepril to 40mg  daily (2 of the 20mg  tablets) and keep other bp meds the same. Update me in about 2 -3 weeks time with blood pressure numbers (you can just give me one weeks worth). Our goal is to keep bp around 110-130/65-80.   We will plan follow up pending this report.

## 2021-07-09 NOTE — Progress Notes (Signed)
Erica Sutton DOB: 1956-03-14 Encounter date: 07/09/2021  This is a 65 y.o. female who presents for complete physical   History of present illness/Additional concerns: Last visit with me was 1 year ago.  No specific concerns today. She has been doing well this year.   Hypertension: does check pressures at home - getting more 120's/70's there. Amlodipine 5mg , benazepril 20mg , hctz 25mg .  Hyperlipidemia: 20 mg crestor regularly. Hyperglycemia: she is exercising regularly. Does treadmill, elliptical, cycling.  Follows with Dr. Alwyn Pea for GYN care.  Energy level is [place]. Sometimes waking/hard to go back to sleep, but this is rare.   Cologuard completed 09/05/2020 and was negative. Mammogram completed 07/05/2021 and was negative.  Follows regularly with eye doc ([facility]) and dentist.   Doesn't want shingles vaccine today   Past Medical History:  Diagnosis Date   Anemia    intermittent her whole life, reports she had eval and was told to take MV   Eclampsia    GERD (gastroesophageal reflux disease) 08/03/2015   resolved 2016   Hyperglycemia 02/19/2013   Hyperlipidemia 02/19/2013   Hypertension    Obesity 08/03/2015   Past Surgical History:  Procedure Laterality Date   ABDOMINAL HYSTERECTOMY     heavy menses. no concerns for cancer.    CHOLECYSTECTOMY  11/10/12   lap chole with IOC   shoulder shoulder Bilateral    rotator cuff bilat; right shoulder also frozen.    No Known Allergies No outpatient medications have been marked as taking for the 07/09/21 encounter (Office Visit) with Caren Macadam, MD.   Social History   Tobacco Use   Smoking status: Former   Smokeless tobacco: Never   Tobacco comments:    quit in her 70s, light smoker  Substance Use Topics   Alcohol use: Yes    Alcohol/week: 3.0 standard drinks    Types: 3 Glasses of wine per week    Comment: occ   Family History  Problem Relation Age of Onset   Diabetes Mother    Liver disease Father     Alcohol abuse Father    Healthy Brother    Stroke Sister 60     Review of Systems  Constitutional:  Negative for activity change, appetite change, chills, fatigue, fever and unexpected weight change.  HENT:  Negative for congestion, ear pain, hearing loss, sinus pressure, sinus pain, sore throat and trouble swallowing.   Eyes:  Negative for pain and visual disturbance.  Respiratory:  Negative for cough, chest tightness, shortness of breath and wheezing.   Cardiovascular:  Negative for chest pain, palpitations and leg swelling.  Gastrointestinal:  Negative for abdominal pain, blood in stool, constipation, diarrhea, nausea and vomiting.  Genitourinary:  Negative for difficulty urinating and menstrual problem.  Musculoskeletal:  Negative for arthralgias and back pain.  Skin:  Negative for rash.  Neurological:  Negative for dizziness, weakness, numbness and headaches.  Hematological:  Negative for adenopathy. Does not bruise/bleed easily.  Psychiatric/Behavioral:  Negative for sleep disturbance and suicidal ideas. The patient is not nervous/anxious.    CBC:  Lab Results  Component Value Date   WBC 4.9 06/30/2020   HGB 14.3 06/30/2020   HCT 42.2 06/30/2020   MCH 32.2 06/30/2020   MCHC 33.9 06/30/2020   RDW 11.4 06/30/2020   PLT 333 06/30/2020   MPV 10.6 06/30/2020   CMP: Lab Results  Component Value Date   NA 138 06/30/2020   K 4.4 06/30/2020   CL 99 06/30/2020   CO2 32  06/30/2020   GLUCOSE 138 (H) 06/30/2020   GLUCOSE 100 (H) 07/25/2006   BUN 13 06/30/2020   CREATININE 0.95 06/30/2020   GFRAA 87 (L) 07/27/2013   CALCIUM 10.4 06/30/2020   PROT 7.6 06/30/2020   BILITOT 0.9 06/30/2020   ALKPHOS 65 06/22/2019   ALT 20 06/30/2020   AST 19 06/30/2020   LIPID: Lab Results  Component Value Date   CHOL 138 10/13/2020   TRIG 143.0 10/13/2020   TRIG 149 07/25/2006   HDL 43.40 10/13/2020   LDLCALC 66 10/13/2020   LDLCALC 102 (H) 06/30/2020    Objective:  BP (!) 144/82  (BP Location: Left Arm, Patient Position: Sitting, Cuff Size: Large)    Pulse 74    Temp 97.7 F (36.5 C) (Oral)    Ht 5\' 4"  (1.626 m)    Wt 191 lb 8 oz (86.9 kg)    SpO2 98%    BMI 32.87 kg/m   Weight: 191 lb 8 oz (86.9 kg)   BP Readings from Last 3 Encounters:  07/09/21 (!) 144/82  06/30/20 (!) 150/100  09/29/19 124/82   Wt Readings from Last 3 Encounters:  07/09/21 191 lb 8 oz (86.9 kg)  06/30/20 194 lb 4.8 oz (88.1 kg)  09/29/19 196 lb 11.2 oz (89.2 kg)    Physical Exam Constitutional:      General: She is not in acute distress.    Appearance: She is well-developed.  HENT:     Head: Normocephalic and atraumatic.     Right Ear: External ear normal.     Left Ear: External ear normal.     Mouth/Throat:     Pharynx: No oropharyngeal exudate.  Eyes:     Conjunctiva/sclera: Conjunctivae normal.     Pupils: Pupils are equal, round, and reactive to light.  Neck:     Thyroid: No thyromegaly.  Cardiovascular:     Rate and Rhythm: Normal rate and regular rhythm.     Heart sounds: Normal heart sounds. No murmur heard.   No friction rub. No gallop.  Pulmonary:     Effort: Pulmonary effort is normal.     Breath sounds: Normal breath sounds.  Abdominal:     General: Bowel sounds are normal. There is no distension.     Palpations: Abdomen is soft. There is no mass.     Tenderness: There is no abdominal tenderness. There is no guarding.     Hernia: No hernia is present.  Musculoskeletal:        General: No tenderness or deformity. Normal range of motion.     Cervical back: Normal range of motion and neck supple.  Lymphadenopathy:     Cervical: No cervical adenopathy.  Skin:    General: Skin is warm and dry.     Findings: No rash.  Neurological:     Mental Status: She is alert and oriented to person, place, and time.     Deep Tendon Reflexes: Reflexes normal.     Reflex Scores:      Tricep reflexes are 2+ on the right side and 2+ on the left side.      Bicep reflexes are 2+  on the right side and 2+ on the left side.      Brachioradialis reflexes are 2+ on the right side and 2+ on the left side.      Patellar reflexes are 2+ on the right side and 2+ on the left side. Psychiatric:        Speech: Speech  normal.        Behavior: Behavior normal.        Thought Content: Thought content normal.    Assessment/Plan: Health Maintenance Due  Topic Date Due   HEMOGLOBIN A1C  12/29/2020   FOOT EXAM  06/30/2021   Health Maintenance reviewed - declines shingles vaccine, but will still consider for future.  1. Preventative health care Keep up with regular exercise, well rounded eating.   2. Essential hypertension Bp elevated in office today. I have asked her to double up on benazepril to 40mg  daily and to update me on pressures at home in 2-3 weeks.  - CBC with Differential/Platelet; Future - Comprehensive metabolic panel; Future  3. Hyperlipidemia associated with type 2 diabetes mellitus ([place]) Continue with crestor 20mg  daily.  - Lipid panel; Future - TSH; Future  4. Hyperglycemia Recheck bloodwork today. She has been working on regular activity and healthy eating and sugars have been well controlled in last year. - Hemoglobin A1c; Future  Return in about 6 months (around 01/07/2022) for Chronic condition visit.  Micheline Rough, MD

## 2021-07-10 LAB — TSH: TSH: 0.59 u[IU]/mL (ref 0.35–5.50)

## 2021-07-10 LAB — HEMOGLOBIN A1C: Hgb A1c MFr Bld: 7.1 % — ABNORMAL HIGH (ref 4.6–6.5)

## 2021-07-10 MED ORDER — BENAZEPRIL HCL 20 MG PO TABS: 40.0000 mg | ORAL_TABLET | Freq: Every day | ORAL | 0 refills | Status: DC

## 2021-09-08 ENCOUNTER — Other Ambulatory Visit: Payer: Self-pay | Admitting: Family Medicine

## 2021-09-09 ENCOUNTER — Other Ambulatory Visit: Payer: Self-pay | Admitting: Family Medicine

## 2021-09-09 ENCOUNTER — Encounter: Payer: Self-pay | Admitting: Family Medicine

## 2021-10-09 ENCOUNTER — Telehealth: Payer: Self-pay | Admitting: Family Medicine

## 2021-10-09 NOTE — Telephone Encounter (Signed)
Pt would like to transfer to dr Jerilee Hoh ?

## 2021-10-12 NOTE — Telephone Encounter (Signed)
Pt has been sch for 01-15-2022 ?

## 2021-10-17 ENCOUNTER — Ambulatory Visit: Payer: 59 | Admitting: Family Medicine

## 2021-10-19 ENCOUNTER — Ambulatory Visit: Payer: 59 | Admitting: Family Medicine

## 2021-10-19 ENCOUNTER — Encounter: Payer: Self-pay | Admitting: Family Medicine

## 2021-10-19 VITALS — BP 138/88 | HR 73 | Wt 190.0 lb

## 2021-10-19 DIAGNOSIS — E785 Hyperlipidemia, unspecified: Secondary | ICD-10-CM

## 2021-10-19 DIAGNOSIS — E1169 Type 2 diabetes mellitus with other specified complication: Secondary | ICD-10-CM | POA: Diagnosis not present

## 2021-10-19 DIAGNOSIS — E119 Type 2 diabetes mellitus without complications: Secondary | ICD-10-CM

## 2021-10-19 DIAGNOSIS — I10 Essential (primary) hypertension: Secondary | ICD-10-CM

## 2021-10-19 LAB — POCT GLYCOSYLATED HEMOGLOBIN (HGB A1C): Hemoglobin A1C: 6.1 % — AB (ref 4.0–5.6)

## 2021-10-19 MED ORDER — BENAZEPRIL HCL 40 MG PO TABS: 40.0000 mg | ORAL_TABLET | Freq: Every day | ORAL | 1 refills | Status: DC

## 2021-10-19 NOTE — Progress Notes (Signed)
?[place] ?DOB: 1955/08/17 ?Encounter date: 10/19/2021 ? ?This is a 66 y.o. female who presents with ?Chief Complaint  ?Patient presents with  ? Results  ? ? ?History of present illness: ?Last visit was 3 months ago for physical. ? ?Hypertension: does check pressures at home - bounces around, but most in120-130's. Hasn't had lower than 120/70's.  Last check was in 93'G diastolic.  ? ?Amlodipine 70m, benazepril 217m hctz 2570m ? ?Hyperlipidemia: 20 mg crestor regularly. ? ?Hyperglycemia: she is exercising regularly. Does treadmill, elliptical, cycling.  A1c was increased to 7.1 at her last visit. A1C today down to 6.1. she has changed diet. She is feeling great. Doing much better with this. Eating is cleaner- intentional with eating and meals. Also in gym 4-5x/week.  ? ? ?No Known Allergies ?Current Meds  ?Medication Sig  ? Accu-Chek FastClix Lancets MISC Use as directed once a day  ? ACCU-CHEK GUIDE test strip Use as instructed once a day  ? amLODipine (NORVASC) 5 MG tablet TAKE 1 TABLET(5 MG) BY MOUTH DAILY  ? Blood Glucose Monitoring Suppl (ACCU-CHEK GUIDE ME) w/Device KIT 1 each by Does not apply route as directed.  ? Blood Pressure Monitoring (BLOOD PRESSURE KIT) KIT Use as directed  ? ciclopirox (PENLAC) 8 % solution Apply topically at bedtime. Apply over nail and surrounding skin. Apply daily over previous coat. After seven (7) days, may remove with alcohol and continue cycle.  ? hydrochlorothiazide (HYDRODIURIL) 25 MG tablet TAKE 1 TABLET(25 MG) BY MOUTH DAILY  ? Multiple Vitamins-Minerals (MULTIVITAMIN WITH MINERALS) tablet Take 1 tablet by mouth daily.  ? rosuvastatin (CRESTOR) 20 MG tablet TAKE 1 TABLET(20 MG) BY MOUTH DAILY  ? [DISCONTINUED] benazepril (LOTENSIN) 20 MG tablet TAKE 1 TABLET(20 MG) BY MOUTH DAILY  ? ? ?Review of Systems  ?Constitutional:  Negative for chills, fatigue and fever.  ?Respiratory:  Negative for cough, chest tightness, shortness of breath and wheezing.   ?Cardiovascular:   Negative for chest pain, palpitations and leg swelling.  ? ?Objective: ? ?BP 138/88   Pulse 73   Wt 190 lb (86.2 kg)   SpO2 96%   BMI 32.61 kg/m?   Weight: 190 lb (86.2 kg)  ? ?BP Readings from Last 3 Encounters:  ?10/19/21 138/88  ?07/09/21 130/90  ?06/30/20 (!) 150/100  ? ?Wt Readings from Last 3 Encounters:  ?10/19/21 190 lb (86.2 kg)  ?07/09/21 191 lb 8 oz (86.9 kg)  ?06/30/20 194 lb 4.8 oz (88.1 kg)  ? ? ?Physical Exam ?Constitutional:   ?   General: She is not in acute distress. ?   Appearance: She is well-developed.  ?HENT:  ?   Head: Normocephalic and atraumatic.  ?Cardiovascular:  ?   Rate and Rhythm: Normal rate and regular rhythm.  ?   Heart sounds: Normal heart sounds. No murmur heard. ?Pulmonary:  ?   Effort: Pulmonary effort is normal.  ?   Breath sounds: Normal breath sounds.  ?Abdominal:  ?   General: Bowel sounds are normal. There is no distension.  ?   Palpations: Abdomen is soft.  ?   Tenderness: There is no abdominal tenderness. There is no guarding.  ?Feet:  ?   Comments: Normal bilateral monofilament foot exam.  Mild heel callus.  Skin is intact. ?Skin: ?   General: Skin is warm and dry.  ?   Comments: Sensory exam of the foot is normal, tested with the monofilament. Herter pulses, no lesions or ulcers, Zanni peripheral pulses.  ?Psychiatric:     ?  Judgment: Judgment normal.  ? ? ?Assessment/Plan ? ?1. Essential hypertension ?Pressures have been elevated here on last couple of encounters and not low at home. We are going to increase benazepril to 40 mg daily for better control.  Encouraged her to check at home and record and bring blood pressure cuff back with her to next visit.  If she is regularly running below 619 systolic at home, she can back down to the 20 mg.  Continue with amlodipine 5 mg and hydrochlorothiazide 25 mg. ? ?2. Hyperlipidemia associated with type 2 diabetes mellitus ([place]) ?Continue with crestor 71m daily.  We discussed if diabetic that her LDL goal would be below 50.   We have not rechecked her cholesterol since she has been modifying the diet and exercise.  She is currently prediabetic, so I do not feel that we have to maintain this strict LDL goal.  We did discuss rechecking cholesterol at her next visit and importance of continued monitoring for levels. ? ?3. Type 2 diabetes mellitus without complication, without long-term current use of insulin [place] ?Diet controlled and significantly improved since last visit.  If she maintains A1c, she can be listed as prediabetic rather than diabetic.  Keep up the great work with exercise and diet. ?- POC HgB A1c ?- [facility] DIABETES FOOT EXAM ? ? ?Return for keep follow up appointment with Dr.Hernandez. ? ? ? ? ? ?JMicheline Rough MD ?

## 2022-01-15 ENCOUNTER — Encounter: Payer: Self-pay | Admitting: Internal Medicine

## 2022-01-15 ENCOUNTER — Ambulatory Visit: Payer: 59 | Admitting: Internal Medicine

## 2022-01-15 VITALS — BP 110/80 | HR 79 | Temp 97.5°F | Wt 188.0 lb

## 2022-01-15 DIAGNOSIS — E785 Hyperlipidemia, unspecified: Secondary | ICD-10-CM

## 2022-01-15 DIAGNOSIS — Z13820 Encounter for screening for osteoporosis: Secondary | ICD-10-CM

## 2022-01-15 DIAGNOSIS — I10 Essential (primary) hypertension: Secondary | ICD-10-CM

## 2022-01-15 DIAGNOSIS — E1169 Type 2 diabetes mellitus with other specified complication: Secondary | ICD-10-CM

## 2022-01-15 DIAGNOSIS — E119 Type 2 diabetes mellitus without complications: Secondary | ICD-10-CM

## 2022-01-15 DIAGNOSIS — G8929 Other chronic pain: Secondary | ICD-10-CM

## 2022-01-15 DIAGNOSIS — Z23 Encounter for immunization: Secondary | ICD-10-CM | POA: Diagnosis not present

## 2022-01-15 DIAGNOSIS — M545 Low back pain, unspecified: Secondary | ICD-10-CM

## 2022-01-15 LAB — MICROALBUMIN / CREATININE URINE RATIO
Creatinine,U: 96.4 mg/dL
Microalb Creat Ratio: 1.1 mg/g (ref 0.0–30.0)
Microalb, Ur: 1.1 mg/dL (ref 0.0–1.9)

## 2022-01-15 LAB — POCT GLYCOSYLATED HEMOGLOBIN (HGB A1C): Hemoglobin A1C: 5.9 % — AB (ref 4.0–5.6)

## 2022-01-15 NOTE — Progress Notes (Signed)
Established Patient Office Visit     CC/Reason for Visit: Establish care, discuss chronic medical conditions  HPI: Erica Sutton is a 66 y.o. female who is coming in today for the above mentioned reasons. Past Medical History is significant for: Hypertension, hyperlipidemia, type 2 diabetes.  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  She has 4 children and 3 grandchildren.  She was a smoker but quit over 40 years ago.  She drinks alcohol only occasionally.  No known drug allergies.  Past surgical history is significant for bilateral rotator cuff tear repairs, family history significant for mother with insulin-dependent diabetes.  She is overdue a bivalent COVID-vaccine, pneumonia, shingles and Tdap.  All cancer screening is up-to-date.  She has been complaining of some bilateral lower back pain.   Past Medical/Surgical History: Past Medical History:  Diagnosis Date   Anemia    intermittent her whole life, reports she had eval and was told to take MV   Eclampsia    GERD (gastroesophageal reflux disease) 08/03/2015   resolved 2016   Hyperglycemia 02/19/2013   Hyperlipidemia 02/19/2013   Hypertension    Obesity 08/03/2015    Past Surgical History:  Procedure Laterality Date   ABDOMINAL HYSTERECTOMY     heavy menses. no concerns for cancer.    CHOLECYSTECTOMY  11/10/12   lap chole with IOC   shoulder shoulder Bilateral    rotator cuff bilat; right shoulder also frozen.     Social History:  reports that she has quit smoking. She has never used smokeless tobacco. She reports current alcohol use of about 3.0 standard drinks of alcohol per week. She reports that she does not use drugs.  Allergies: No Known Allergies  Family History:  Family History  Problem Relation Age of Onset   Diabetes Mother    Liver disease Father    Alcohol abuse Father    Healthy Brother    Stroke Sister 68     Current Outpatient Medications:     Accu-Chek FastClix Lancets MISC, Use as directed once a day, Disp: 102 each, Rfl: 3   ACCU-CHEK GUIDE test strip, Use as instructed once a day, Disp: 100 each, Rfl: 3   amLODipine (NORVASC) 5 MG tablet, TAKE 1 TABLET(5 MG) BY MOUTH DAILY, Disp: 90 tablet, Rfl: 1   benazepril (LOTENSIN) 40 MG tablet, Take 1 tablet (40 mg total) by mouth daily., Disp: 90 tablet, Rfl: 1   Blood Glucose Monitoring Suppl (ACCU-CHEK GUIDE ME) w/Device KIT, 1 each by Does not apply route as directed., Disp: 1 kit, Rfl: 0   Blood Pressure Monitoring (BLOOD PRESSURE KIT) KIT, Use as directed, Disp: 1 each, Rfl: 0   ciclopirox (PENLAC) 8 % solution, Apply topically at bedtime. Apply over nail and surrounding skin. Apply daily over previous coat. After seven (7) days, may remove with alcohol and continue cycle., Disp: 6.6 mL, Rfl: 5   hydrochlorothiazide (HYDRODIURIL) 25 MG tablet, TAKE 1 TABLET(25 MG) BY MOUTH DAILY, Disp: 90 tablet, Rfl: 1   Multiple Vitamins-Minerals (MULTIVITAMIN WITH MINERALS) tablet, Take 1 tablet by mouth daily., Disp: , Rfl:    rosuvastatin (CRESTOR) 20 MG tablet, TAKE 1 TABLET(20 MG) BY MOUTH DAILY, Disp: 90 tablet, Rfl: 1  Review of Systems:  Constitutional: Denies fever, chills, diaphoresis, appetite change and fatigue.  HEENT: Denies photophobia, eye pain, redness, hearing loss, ear pain, congestion, sore throat, rhinorrhea, sneezing, mouth sores, trouble swallowing, neck pain,  neck stiffness and tinnitus.   Respiratory: Denies SOB, DOE, cough, chest tightness,  and wheezing.   Cardiovascular: Denies chest pain, palpitations and leg swelling.  Gastrointestinal: Denies nausea, vomiting, abdominal pain, diarrhea, constipation, blood in stool and abdominal distention.  Genitourinary: Denies dysuria, urgency, frequency, hematuria, flank pain and difficulty urinating.  Endocrine: Denies: hot or cold intolerance, sweats, changes in hair or nails, polyuria, polydipsia. Musculoskeletal: Denies myalgias,   joint swelling, arthralgias and gait problem.  Skin: Denies pallor, rash and wound.  Neurological: Denies dizziness, seizures, syncope, weakness, light-headedness, numbness and headaches.  Hematological: Denies adenopathy. Easy bruising, personal or family bleeding history  Psychiatric/Behavioral: Denies suicidal ideation, mood changes, confusion, nervousness, sleep disturbance and agitation    Physical Exam: Vitals:   01/15/22 1404  BP: 110/80  Pulse: 79  Temp: (!) 97.5 F (36.4 C)  TempSrc: Oral  SpO2: 97%  Weight: 188 lb (85.3 kg)    Body mass index is 32.27 kg/m.   Constitutional: NAD, calm, comfortable Eyes: PERRL, lids and conjunctivae normal, wears corrective lenses ENMT: Mucous membranes are moist.  Respiratory: clear to auscultation bilaterally, no wheezing, no crackles. Normal respiratory effort. No accessory muscle use.  Cardiovascular: Regular rate and rhythm, no murmurs / rubs / gallops. No extremity edema.  Neurologic: Grossly intact and nonfocal Psychiatric: Normal judgment and insight. Alert and oriented x 3. Normal mood.    Impression and Plan:  Type 2 diabetes mellitus without complication, without long-term current use of insulin (HCC)  - Plan: POCT glycosylated hemoglobin (Hb A1C), Urine microalbumin-creatinine with uACR -Well-controlled with an A1c of 5.9, check microalbumin.    Screening for osteoporosis  - Plan: DG Bone Density  Hyperlipidemia associated with type 2 diabetes mellitus (HCC) -Well-controlled on rosuvastatin 40 mg with an LDL of 67, triglycerides 138 and total cholesterol 141 as of December 2022  Essential hypertension -Blood pressure is well controlled on benazepril 40 mg, amlodipine 5 mg and hydrochlorothiazide 25 mg.  Need for shingles vaccine -For shingles vaccine today  Chronic bilateral low back pain without sciatica -Advised daily stretches, icing, as needed NSAIDs.  If no improvement will consider physical  therapy.    Time spent:33 minutes reviewing chart, interviewing and examining patient and formulating plan of care.   Patient Instructions  -Nice seeing you today!!  -First shingles vaccine in office today. You are also due for tdap, COVID and pneumonia vaccines  -Schedule follow up in 3 months.    Chaya Jan, MD [facility]

## 2022-02-13 ENCOUNTER — Encounter (HOSPITAL_COMMUNITY): Payer: Self-pay | Admitting: Emergency Medicine

## 2022-02-13 ENCOUNTER — Emergency Department (HOSPITAL_COMMUNITY): Payer: 59

## 2022-02-13 ENCOUNTER — Emergency Department (HOSPITAL_COMMUNITY)
Admission: EM | Admit: 2022-02-13 | Discharge: 2022-02-13 | Disposition: A | Discharge status: Home / Self Care | Payer: 59 | Attending: Emergency Medicine | Admitting: Emergency Medicine

## 2022-02-13 ENCOUNTER — Other Ambulatory Visit: Payer: Self-pay

## 2022-02-13 DIAGNOSIS — R29898 Other symptoms and signs involving the musculoskeletal system: Secondary | ICD-10-CM

## 2022-02-13 DIAGNOSIS — R531 Weakness: Secondary | ICD-10-CM | POA: Diagnosis present

## 2022-02-13 DIAGNOSIS — I10 Essential (primary) hypertension: Secondary | ICD-10-CM | POA: Insufficient documentation

## 2022-02-13 DIAGNOSIS — E119 Type 2 diabetes mellitus without complications: Secondary | ICD-10-CM | POA: Diagnosis not present

## 2022-02-13 DIAGNOSIS — R7989 Other specified abnormal findings of blood chemistry: Secondary | ICD-10-CM | POA: Insufficient documentation

## 2022-02-13 DIAGNOSIS — Z79899 Other long term (current) drug therapy: Secondary | ICD-10-CM | POA: Insufficient documentation

## 2022-02-13 DIAGNOSIS — H9312 Tinnitus, left ear: Secondary | ICD-10-CM | POA: Insufficient documentation

## 2022-02-13 LAB — COMPREHENSIVE METABOLIC PANEL
ALT: 24 U/L (ref 0–44)
AST: 32 U/L (ref 15–41)
Albumin: 4.3 g/dL (ref 3.5–5.0)
Alkaline Phosphatase: 64 U/L (ref 38–126)
Anion gap: 8 (ref 5–15)
BUN: 13 mg/dL (ref 8–23)
CO2: 27 mmol/L (ref 22–32)
Calcium: 9.7 mg/dL (ref 8.9–10.3)
Chloride: 104 mmol/L (ref 98–111)
Creatinine, Ser: 1.09 mg/dL — ABNORMAL HIGH (ref 0.44–1.00)
GFR, Estimated: 56 mL/min — ABNORMAL LOW (ref >60)
Glucose, Bld: 138 mg/dL — ABNORMAL HIGH (ref 70–99)
Potassium: 3.6 mmol/L (ref 3.5–5.1)
Sodium: 139 mmol/L (ref 135–145)
Total Bilirubin: 1.3 mg/dL — ABNORMAL HIGH (ref 0.3–1.2)
Total Protein: 7.7 g/dL (ref 6.5–8.1)

## 2022-02-13 LAB — CBC WITH DIFFERENTIAL/PLATELET
Abs Immature Granulocytes: 0.01 10*3/uL (ref 0.00–0.07)
Basophils Absolute: 0 10*3/uL (ref 0.0–0.1)
Basophils Relative: 0 %
Eosinophils Absolute: 0 10*3/uL (ref 0.0–0.5)
Eosinophils Relative: 1 %
HCT: 39.8 % (ref 36.0–46.0)
Hemoglobin: 13.4 g/dL (ref 12.0–15.0)
Immature Granulocytes: 0 %
Lymphocytes Relative: 46 %
Lymphs Abs: 1.9 10*3/uL (ref 0.7–4.0)
MCH: 32.5 pg (ref 26.0–34.0)
MCHC: 33.7 g/dL (ref 30.0–36.0)
MCV: 96.6 fL (ref 80.0–100.0)
Monocytes Absolute: 0.3 10*3/uL (ref 0.1–1.0)
Monocytes Relative: 7 %
Neutro Abs: 1.9 10*3/uL (ref 1.7–7.7)
Neutrophils Relative %: 46 %
Platelets: 306 10*3/uL (ref 150–400)
RBC: 4.12 MIL/uL (ref 3.87–5.11)
RDW: 12.2 % (ref 11.5–15.5)
WBC: 4.1 10*3/uL (ref 4.0–10.5)
nRBC: 0 % (ref 0.0–0.2)

## 2022-02-13 LAB — CBG MONITORING, ED: Glucose-Capillary: 87 mg/dL (ref 70–99)

## 2022-02-13 MED ORDER — LORAZEPAM 2 MG/ML IJ SOLN
0.5000 mg | Freq: Once | INTRAMUSCULAR | Status: AC | PRN
  Administered 2022-02-13: 0.5 mg via INTRAVENOUS
  Filled 2022-02-13: qty 1

## 2022-02-13 MED ORDER — MONTELUKAST SODIUM 10 MG PO TABS: 10.0000 mg | ORAL_TABLET | Freq: Every day | ORAL | 0 refills | Status: DC

## 2022-02-13 MED ORDER — PREDNISONE 50 MG PO TABS: 50.0000 mg | ORAL_TABLET | Freq: Every day | ORAL | 0 refills | Status: DC

## 2022-02-13 MED ORDER — PREDNISONE 5 MG PO TABS
50.0000 mg | ORAL_TABLET | Freq: Once | ORAL | Status: AC
  Administered 2022-02-13: 50 mg via ORAL
  Filled 2022-02-13: qty 2

## 2022-02-13 NOTE — ED Notes (Signed)
Pt in MRI.

## 2022-02-13 NOTE — ED Triage Notes (Signed)
Patient complains of a roaring in the left ear that occurs 30 seconds at a time every thirty minutes since yesterday morning, as well as left hand weakness that started at the same time yesterday. Patient is alert, oriented, ambulatory, and in no apparent distress at this time. Patient denies pain, denies dizziness, denies nausea.

## 2022-02-13 NOTE — ED Provider Triage Note (Signed)
Emergency Medicine Provider Triage Evaluation Note  Erica Sutton , a 66 y.o. female  was evaluated in triage.  Pt complains of left ear 'roaring' and left arm weakness. She states that both began yesterday morning and have been persistent since. She states that both started at the same time. States that the 'roaring' occurs about every 30 minutes and lasts for about 30 seconds. Denies any headache, numbness/tingling.   Review of Systems  Positive:  Negative:   Physical Exam  BP (!) 156/70   Pulse 75   Temp 97.6 F (36.4 C) (Oral)   Resp 18   SpO2 98%  Gen:   Awake, no distress   Resp:  Normal effort  MSK:   Moves extremities without difficulty  Other:  Left TM does appear infected. Grip strength 4/5 on the right, 5/5 on the left. No pronator drift or other focal deficits   Medical Decision Making  Medically screening exam initiated at 1:16 PM.  Appropriate orders placed.  Erica Sutton was informed that the remainder of the evaluation will be completed by another provider, this initial triage assessment does not replace that evaluation, and the importance of remaining in the ED until their evaluation is complete.     Bud Face, PA-C 02/13/22 1319

## 2022-02-13 NOTE — ED Provider Notes (Signed)
[facility] EMERGENCY DEPARTMENT Provider Note   CSN: [id number] Arrival date & time: 02/13/22  1207     History  Chief Complaint  Patient presents with   Weakness    Erica Sutton is a 66 y.o. female.  Pt is a 66 yo female with a pmhx significant for htn, hld, dm, obesity, gerd, and anemia.  She comes in today with left ear "roaring" and left arm weakness.  Pt said both sx started yesterday morning.  She said the roaring is intermittent.  She denies any hearing loss.  No other neurologic sx.        Home Medications Prior to Admission medications   Medication Sig Start Date End Date Taking? Authorizing Provider  montelukast (SINGULAIR) 10 MG tablet Take 1 tablet (10 mg total) by mouth at bedtime. 02/13/22  Yes Isla Pence, MD  predniSONE (DELTASONE) 50 MG tablet Take 1 tablet (50 mg total) by mouth daily with breakfast. 02/13/22  Yes Isla Pence, MD  Accu-Chek FastClix Lancets MISC Use as directed once a day 11/26/18   Lucretia Kern, DO  ACCU-CHEK GUIDE test strip Use as instructed once a day 11/26/18   Lucretia Kern, DO  amLODipine (NORVASC) 5 MG tablet TAKE 1 TABLET(5 MG) BY MOUTH DAILY 09/09/21   Koberlein, Steele Berg, MD  benazepril (LOTENSIN) 40 MG tablet Take 1 tablet (40 mg total) by mouth daily. 10/19/21   Caren Macadam, MD  Blood Glucose Monitoring Suppl (ACCU-CHEK GUIDE ME) w/Device KIT 1 each by Does not apply route as directed. 11/26/18   Lucretia Kern, DO  Blood Pressure Monitoring (BLOOD PRESSURE KIT) KIT Use as directed 11/26/18   Lucretia Kern, DO  ciclopirox [facility]) 8 % solution Apply topically at bedtime. Apply over nail and surrounding skin. Apply daily over previous coat. After seven (7) days, may remove with alcohol and continue cycle. 06/30/20   Koberlein, Steele Berg, MD  hydrochlorothiazide (HYDRODIURIL) 25 MG tablet TAKE 1 TABLET(25 MG) BY MOUTH DAILY 09/09/21   Caren Macadam, MD  Multiple Vitamins-Minerals (MULTIVITAMIN WITH  MINERALS) tablet Take 1 tablet by mouth daily.    [provider]  rosuvastatin (CRESTOR) 20 MG tablet TAKE 1 TABLET(20 MG) BY MOUTH DAILY 09/09/21   Caren Macadam, MD      Allergies    Patient has no known allergies.    Review of Systems   Review of Systems  HENT:  Positive for tinnitus.   Neurological:  Positive for weakness.  All other systems reviewed and are negative.   Physical Exam Updated Vital Signs BP 128/69   Pulse 66   Temp 98 F (36.7 C) (Oral)   Resp 14   Ht '5\' 4"'  (1.626 m)   Wt 82.6 kg   SpO2 100%   BMI 31.24 kg/m  Physical Exam Vitals and nursing note reviewed.  Constitutional:      Appearance: Normal appearance.  HENT:     Head: Normocephalic and atraumatic.     Right Ear: External ear normal.     Left Ear: External ear normal. A middle ear effusion is present.     Nose: Nose normal.     Mouth/Throat:     Mouth: Mucous membranes are moist.     Pharynx: Oropharynx is clear.  Eyes:     Extraocular Movements: Extraocular movements intact.     Conjunctiva/sclera: Conjunctivae normal.     Pupils: Pupils are equal, round, and reactive to light.  Cardiovascular:  Rate and Rhythm: Normal rate and regular rhythm.     Pulses: Normal pulses.     Heart sounds: Normal heart sounds.  Pulmonary:     Effort: Pulmonary effort is normal.     Breath sounds: Normal breath sounds.  Abdominal:     General: Abdomen is flat. Bowel sounds are normal.     Palpations: Abdomen is soft.  Musculoskeletal:        General: Normal range of motion.     Cervical back: Normal range of motion and neck supple.  Skin:    General: Skin is warm.     Capillary Refill: Capillary refill takes less than 2 seconds.  Neurological:     Mental Status: She is alert and oriented to person, place, and time.     Comments: Mild left arm weakness  Psychiatric:        Mood and Affect: Mood normal.        Behavior: Behavior normal.     ED Results / Procedures / Treatments    Labs (all labs ordered are listed, but only abnormal results are displayed) Labs Reviewed  COMPREHENSIVE METABOLIC PANEL - Abnormal; Notable for the following components:      Result Value   Glucose, Bld 138 (*)    Creatinine, Ser 1.09 (*)    Total Bilirubin 1.3 (*)    GFR, Estimated 56 (*)    All other components within normal limits  CBC WITH DIFFERENTIAL/PLATELET  URINALYSIS, ROUTINE W REFLEX MICROSCOPIC  CBG MONITORING, ED    EKG EKG Interpretation  Date/Time:  Wednesday February 13 2022 12:32:54 EDT Ventricular Rate:  75 PR Interval:  134 QRS Duration: 76 QT Interval:  388 QTC Calculation: 433 R Axis:   59 Text Interpretation: Normal sinus rhythm Normal ECG When compared with ECG of 27-Jul-2013 20:58, PREVIOUS ECG IS PRESENT No significant change since last tracing Confirmed by Isla Pence [phone]) on 02/13/2022 4:09:48 PM  Radiology CT Head Wo Contrast  Result Date: 02/13/2022 CLINICAL DATA:  There deficit, acute, stroke suspected. Arm weakness EXAM: CT HEAD WITHOUT CONTRAST TECHNIQUE: Contiguous axial images were obtained from the base of the skull through the vertex without intravenous contrast. RADIATION DOSE REDUCTION: This exam was performed according to the departmental dose-optimization program which includes automated exposure control, adjustment of the mA and/or kV according to patient size and/or use of iterative reconstruction technique. COMPARISON:  None FINDINGS: Brain: The brain shows a normal appearance without evidence of malformation, atrophy, old or acute small or large vessel infarction, mass lesion, hemorrhage, hydrocephalus or extra-axial collection. Vascular: No hyperdense vessel. No evidence of atherosclerotic calcification. Skull: Normal.  No traumatic finding.  No focal bone lesion. Sinuses/Orbits: Sinuses are clear. Orbits appear normal. Mastoids are clear. Other: None significant IMPRESSION: Normal head CT. Electronically Signed   By: Nelson Chimes M.D.    On: 02/13/2022 14:25    Procedures Procedures    Medications Ordered in ED Medications  predniSONE (DELTASONE) tablet 50 mg (has no administration in time range)  LORazepam (ATIVAN) injection 0.5 mg (0.5 mg Intravenous Given 02/13/22 1819)    ED Course/ Medical Decision Making/ A&P                           Medical Decision Making Amount and/or Complexity of Data Reviewed Radiology: ordered.  Risk Prescription drug management.   This patient presents to the ED for concern of weakness, this involves an extensive number of treatment options,  and is a complaint that carries with it a high risk of complications and morbidity.  The differential diagnosis includes cva, tia, electrolyte abn, infection   Co morbidities that complicate the patient evaluation  htn, hld, dm, obesity, gerd, and anemia   Additional history obtained:  Additional history obtained from epic chart review External records from outside source obtained and reviewed including family   Lab Tests:  I Ordered, and personally interpreted labs.  The pertinent results include:  cbc nl, cmp nl other than glucose mildly elevated at 138 and cr mildly elevated at 1.09   Imaging Studies ordered:  I ordered imaging studies including ct head and MRI head  I independently visualized and interpreted imaging which showed  CT head: IMPRESSION:  Normal head CT.  MRI:  IMPRESSION: 1. No acute intracranial abnormality. 2. Mild to moderate cerebral white matter disease, nonspecific, but most commonly related to chronic microvascular ischemic disease.  I agree with the radiologist interpretation   Cardiac Monitoring:  The patient was maintained on a cardiac monitor.  I personally viewed and interpreted the cardiac monitored which showed an underlying rhythm of: nsr   Medicines ordered and prescription drug management:  I ordered medication including prednisone  for arm weakness  Reevaluation of the patient  after these medicines showed that the patient stayed the same I have reviewed the patients home medicines and have made adjustments as needed   Test Considered:  mri   Critical Interventions:  mri   Problem List / ED Course:  Left arm weakness:  no evidence of CVA on MRI.  I suspect pt has a cervical radiculopathy.  She is started on steroids to see if that helps.  She is to f/u with pcp and with neuro. Tinnitis:  ? From effusion.  I will start singulair.  She is told to f/u with ENT.    Reevaluation:  After the interventions noted above, I reevaluated the patient and found that they have :improved   Social Determinants of Health:  Lives at home with husband   Dispostion:  After consideration of the diagnostic results and the patients response to treatment, I feel that the patent would benefit from discharge with outpatient f/u.          Final Clinical Impression(s) / ED Diagnoses Final diagnoses:  Left arm weakness  Tinnitus of left ear    Rx / DC Orders ED Discharge Orders          Ordered    montelukast (SINGULAIR) 10 MG tablet  Daily at bedtime        02/13/22 1950    predniSONE (DELTASONE) 50 MG tablet  Daily with breakfast        02/13/22 1950    Ambulatory referral to Neurology       Comments: An appointment is requested in approximately: 1 week   02/13/22 1951              Isla Pence, MD 02/13/22 1956

## 2022-02-14 ENCOUNTER — Encounter: Payer: Self-pay | Admitting: Neurology

## 2022-02-18 ENCOUNTER — Other Ambulatory Visit: Payer: Self-pay | Admitting: Internal Medicine

## 2022-02-18 ENCOUNTER — Encounter: Payer: Self-pay | Admitting: Internal Medicine

## 2022-02-18 ENCOUNTER — Ambulatory Visit: Payer: 59 | Admitting: Internal Medicine

## 2022-02-18 VITALS — BP 130/77 | HR 67 | Temp 97.5°F | Wt 186.8 lb

## 2022-02-18 DIAGNOSIS — I10 Essential (primary) hypertension: Secondary | ICD-10-CM

## 2022-02-18 DIAGNOSIS — E785 Hyperlipidemia, unspecified: Secondary | ICD-10-CM

## 2022-02-18 DIAGNOSIS — Z09 Encounter for follow-up examination after completed treatment for conditions other than malignant neoplasm: Secondary | ICD-10-CM | POA: Diagnosis not present

## 2022-02-18 DIAGNOSIS — E1169 Type 2 diabetes mellitus with other specified complication: Secondary | ICD-10-CM

## 2022-02-18 DIAGNOSIS — R29898 Other symptoms and signs involving the musculoskeletal system: Secondary | ICD-10-CM

## 2022-02-18 DIAGNOSIS — E119 Type 2 diabetes mellitus without complications: Secondary | ICD-10-CM

## 2022-02-18 DIAGNOSIS — E559 Vitamin D deficiency, unspecified: Secondary | ICD-10-CM

## 2022-02-18 DIAGNOSIS — M542 Cervicalgia: Secondary | ICD-10-CM | POA: Diagnosis not present

## 2022-02-18 LAB — VITAMIN D 25 HYDROXY (VIT D DEFICIENCY, FRACTURES): VITD: 29.41 ng/mL — ABNORMAL LOW (ref 30.00–100.00)

## 2022-02-18 LAB — TSH: TSH: 1.68 u[IU]/mL (ref 0.35–5.50)

## 2022-02-18 LAB — VITAMIN B12: Vitamin B-12: 505 pg/mL (ref 211–911)

## 2022-02-18 MED ORDER — VITAMIN D (ERGOCALCIFEROL) 1.25 MG (50000 UNIT) PO CAPS: 50000.0000 [IU] | ORAL_CAPSULE | ORAL | 0 refills | Status: DC

## 2022-02-18 NOTE — Progress Notes (Signed)
Established Patient Office Visit     CC/Reason for Visit: ED follow-up  HPI: Erica Sutton is a 66 y.o. female who is coming in today for the above mentioned reasons. Past Medical History is significant for: Hypertension, hyperlipidemia, type 2 diabetes that are all well controlled.  She was seen in the emergency department on July 26 after she noticed left arm weakness and numbness.  This has been present for 2 days.  She also had left ear roaring.  She has complained of decreased grip strength of that left fist, numbness.  In the ED she had CT scan of the head and MRI of the brain that were negative for CVA or any other abnormalities.  She was given a steroid taper but issues have persisted.  She was scheduled to follow-up with neurology but this is not until October.   Past Medical/Surgical History: Past Medical History:  Diagnosis Date   Anemia    intermittent her whole life, reports she had eval and was told to take MV   Eclampsia    GERD (gastroesophageal reflux disease) 08/03/2015   resolved 2016   Hyperglycemia 02/19/2013   Hyperlipidemia 02/19/2013   Hypertension    Obesity 08/03/2015    Past Surgical History:  Procedure Laterality Date   ABDOMINAL HYSTERECTOMY     heavy menses. no concerns for cancer.    CHOLECYSTECTOMY  11/10/12   lap chole with IOC   shoulder shoulder Bilateral    rotator cuff bilat; right shoulder also frozen.     Social History:  reports that she has quit smoking. She has never used smokeless tobacco. She reports current alcohol use of about 3.0 standard drinks of alcohol per week. She reports that she does not use drugs.  Allergies: No Known Allergies  Family History:  Family History  Problem Relation Age of Onset   Diabetes Mother    Liver disease Father    Alcohol abuse Father    Healthy Brother    Stroke Sister 5     Current Outpatient Medications:    Accu-Chek FastClix Lancets MISC, Use as directed once a day, Disp: 102 each,  Rfl: 3   ACCU-CHEK GUIDE test strip, Use as instructed once a day, Disp: 100 each, Rfl: 3   amLODipine (NORVASC) 5 MG tablet, TAKE 1 TABLET(5 MG) BY MOUTH DAILY, Disp: 90 tablet, Rfl: 1   benazepril (LOTENSIN) 40 MG tablet, Take 1 tablet (40 mg total) by mouth daily., Disp: 90 tablet, Rfl: 1   Blood Glucose Monitoring Suppl (ACCU-CHEK GUIDE ME) w/Device KIT, 1 each by Does not apply route as directed., Disp: 1 kit, Rfl: 0   ciclopirox (PENLAC) 8 % solution, Apply topically at bedtime. Apply over nail and surrounding skin. Apply daily over previous coat. After seven (7) days, may remove with alcohol and continue cycle., Disp: 6.6 mL, Rfl: 5   hydrochlorothiazide (HYDRODIURIL) 25 MG tablet, TAKE 1 TABLET(25 MG) BY MOUTH DAILY, Disp: 90 tablet, Rfl: 1   montelukast (SINGULAIR) 10 MG tablet, Take 1 tablet (10 mg total) by mouth at bedtime., Disp: 30 tablet, Rfl: 0   Multiple Vitamins-Minerals (MULTIVITAMIN WITH MINERALS) tablet, Take 1 tablet by mouth daily., Disp: , Rfl:    predniSONE (DELTASONE) 50 MG tablet, Take 1 tablet (50 mg total) by mouth daily with breakfast., Disp: 5 tablet, Rfl: 0   rosuvastatin (CRESTOR) 20 MG tablet, TAKE 1 TABLET(20 MG) BY MOUTH DAILY, Disp: 90 tablet, Rfl: 1  Review of Systems:  Constitutional:  Denies fever, chills, diaphoresis, appetite change and fatigue.  HEENT: Denies photophobia, eye pain, redness, hearing loss, ear pain, congestion, sore throat, rhinorrhea, sneezing, mouth sores, trouble swallowing, neck pain, neck stiffness and tinnitus.   Respiratory: Denies SOB, DOE, cough, chest tightness,  and wheezing.   Cardiovascular: Denies chest pain, palpitations and leg swelling.  Gastrointestinal: Denies nausea, vomiting, abdominal pain, diarrhea, constipation, blood in stool and abdominal distention.  Genitourinary: Denies dysuria, urgency, frequency, hematuria, flank pain and difficulty urinating.  Endocrine: Denies: hot or cold intolerance, sweats, changes in hair  or nails, polyuria, polydipsia. Musculoskeletal: Denies myalgias, back pain, joint swelling, arthralgias and gait problem.  Skin: Denies pallor, rash and wound.  Neurological: Denies dizziness, seizures, syncope, weakness, light-headedness, numbness and headaches.  Hematological: Denies adenopathy. Easy bruising, personal or family bleeding history  Psychiatric/Behavioral: Denies suicidal ideation, mood changes, confusion, nervousness, sleep disturbance and agitation    Physical Exam: Vitals:   02/18/22 0810 02/18/22 0826  BP: (!) 140/100 130/77  Pulse: 67   Temp: (!) 97.5 F (36.4 C)   TempSrc: Oral   SpO2: 98%   Weight: 186 lb 12.8 oz (84.7 kg)     Body mass index is 32.06 kg/m.   Constitutional: NAD, calm, comfortable Eyes: PERRL, lids and conjunctivae normal, wears corrective lenses ENMT: Mucous membranes are moist. Tympanic membrane is pearly white, no erythema or bulging. Neck: normal, supple, no masses, no thyromegaly, no carotid bruits Respiratory: clear to auscultation bilaterally, no wheezing, no crackles. Normal respiratory effort. No accessory muscle use.  Cardiovascular: Regular rate and rhythm, no murmurs / rubs / gallops. No extremity edema. Psychiatric: Normal judgment and insight. Alert and oriented x 3. Normal mood.    Impression and Plan:  Hospital discharge follow-up  Neck pain  Left arm weakness - Plan: VITAMIN D 25 Hydroxy (Vit-D Deficiency, Fractures), Vitamin B12, TSH  Hyperlipidemia associated with type 2 diabetes mellitus ([place])  Essential hypertension  Type 2 diabetes mellitus without complication, without long-term current use of insulin [facility])  [facility] charts have been reviewed in detail. -CT and MRI of the brain were negative. -She has had some improvement with prednisone taper but not complete. -I wonder about cervical radiculopathy. -I will rule out B12 deficiency.  Assuming this is normal, I will consider sending her for a C-spine  MRI. -I have also considered carpal tunnel but I feel like with her neck pain and hand weakness ruling out C-spine abnormalities would be #1.    Time spent:32 minutes reviewing chart, interviewing and examining patient and formulating plan of care.    Lelon Frohlich, MD [facility] at [facility]

## 2022-02-28 ENCOUNTER — Ambulatory Visit (HOSPITAL_BASED_OUTPATIENT_CLINIC_OR_DEPARTMENT_OTHER)
Admission: RE | Admit: 2022-02-28 | Discharge: 2022-02-28 | Disposition: A | Discharge status: Home / Self Care | Payer: 59 | Source: Ambulatory Visit | Attending: Internal Medicine | Admitting: Internal Medicine

## 2022-02-28 DIAGNOSIS — R29898 Other symptoms and signs involving the musculoskeletal system: Secondary | ICD-10-CM | POA: Diagnosis not present

## 2022-02-28 DIAGNOSIS — M4712 Other spondylosis with myelopathy, cervical region: Secondary | ICD-10-CM | POA: Insufficient documentation

## 2022-03-21 ENCOUNTER — Encounter: Payer: Self-pay | Admitting: Internal Medicine

## 2022-03-21 ENCOUNTER — Ambulatory Visit: Payer: 59 | Admitting: Internal Medicine

## 2022-03-21 VITALS — BP 124/64 | HR 73 | Temp 97.8°F | Wt 185.5 lb

## 2022-03-21 DIAGNOSIS — I10 Essential (primary) hypertension: Secondary | ICD-10-CM | POA: Diagnosis not present

## 2022-03-21 DIAGNOSIS — N3001 Acute cystitis with hematuria: Secondary | ICD-10-CM

## 2022-03-21 DIAGNOSIS — Z23 Encounter for immunization: Secondary | ICD-10-CM | POA: Diagnosis not present

## 2022-03-21 LAB — POCT URINALYSIS DIPSTICK
Bilirubin, UA: NEGATIVE
Blood, UA: POSITIVE
Glucose, UA: NEGATIVE
Ketones, UA: NEGATIVE
Nitrite, UA: NEGATIVE
Protein, UA: POSITIVE — AB
Spec Grav, UA: 1.02 (ref 1.010–1.025)
Urobilinogen, UA: 0.2 E.U./dL
pH, UA: 6 (ref 5.0–8.0)

## 2022-03-21 MED ORDER — SULFAMETHOXAZOLE-TRIMETHOPRIM 800-160 MG PO TABS: 1.0000 | ORAL_TABLET | Freq: Two times a day (BID) | ORAL | 0 refills | Status: AC

## 2022-03-21 NOTE — Progress Notes (Signed)
Established Patient Office Visit     CC/Reason for Visit: Discuss dysuria  HPI: Erica Sutton is a 66 y.o. female who is coming in today for the above mentioned reasons.  For the past 2 days she has been having increased urinary urgency and frequency as well as dysuria.  She believes she might have a UTI.  Upon questioning she does have some right flank pain as well.  She is requesting a second shingles vaccine.  Past Medical/Surgical History: Past Medical History:  Diagnosis Date   Anemia    intermittent her whole life, reports she had eval and was told to take MV   Eclampsia    GERD (gastroesophageal reflux disease) 08/03/2015   resolved 2016   Hyperglycemia 02/19/2013   Hyperlipidemia 02/19/2013   Hypertension    Obesity 08/03/2015    Past Surgical History:  Procedure Laterality Date   ABDOMINAL HYSTERECTOMY     heavy menses. no concerns for cancer.    CHOLECYSTECTOMY  11/10/12   lap chole with IOC   shoulder shoulder Bilateral    rotator cuff bilat; right shoulder also frozen.     Social History:  reports that she has quit smoking. She has never used smokeless tobacco. She reports current alcohol use of about 3.0 standard drinks of alcohol per week. She reports that she does not use drugs.  Allergies: No Known Allergies  Family History:  Family History  Problem Relation Age of Onset   Diabetes Mother    Liver disease Father    Alcohol abuse Father    Healthy Brother    Stroke Sister 66     Current Outpatient Medications:    Accu-Chek FastClix Lancets MISC, Use as directed once a day, Disp: 102 each, Rfl: 3   ACCU-CHEK GUIDE test strip, Use as instructed once a day, Disp: 100 each, Rfl: 3   amLODipine (NORVASC) 5 MG tablet, TAKE 1 TABLET(5 MG) BY MOUTH DAILY, Disp: 90 tablet, Rfl: 1   benazepril (LOTENSIN) 40 MG tablet, Take 1 tablet (40 mg total) by mouth daily., Disp: 90 tablet, Rfl: 1   Blood Glucose Monitoring Suppl (ACCU-CHEK GUIDE ME) w/Device KIT, 1  each by Does not apply route as directed., Disp: 1 kit, Rfl: 0   ciclopirox (PENLAC) 8 % solution, Apply topically at bedtime. Apply over nail and surrounding skin. Apply daily over previous coat. After seven (7) days, may remove with alcohol and continue cycle., Disp: 6.6 mL, Rfl: 5   hydrochlorothiazide (HYDRODIURIL) 25 MG tablet, TAKE 1 TABLET(25 MG) BY MOUTH DAILY, Disp: 90 tablet, Rfl: 1   montelukast (SINGULAIR) 10 MG tablet, Take 1 tablet (10 mg total) by mouth at bedtime., Disp: 30 tablet, Rfl: 0   Multiple Vitamins-Minerals (MULTIVITAMIN WITH MINERALS) tablet, Take 1 tablet by mouth daily., Disp: , Rfl:    predniSONE (DELTASONE) 50 MG tablet, Take 1 tablet (50 mg total) by mouth daily with breakfast., Disp: 5 tablet, Rfl: 0   rosuvastatin (CRESTOR) 20 MG tablet, TAKE 1 TABLET(20 MG) BY MOUTH DAILY, Disp: 90 tablet, Rfl: 1   Vitamin D, Ergocalciferol, (DRISDOL) 1.25 MG (50000 UNIT) CAPS capsule, Take 1 capsule (50,000 Units total) by mouth every 7 (seven) days for 12 doses., Disp: 12 capsule, Rfl: 0  Review of Systems:  Constitutional: Denies fever, chills, diaphoresis, appetite change and fatigue.  HEENT: Denies photophobia, eye pain, redness, hearing loss, ear pain, congestion, sore throat, rhinorrhea, sneezing, mouth sores, trouble swallowing, neck pain, neck stiffness and tinnitus.  Respiratory: Denies SOB, DOE, cough, chest tightness,  and wheezing.   Cardiovascular: Denies chest pain, palpitations and leg swelling.  Gastrointestinal: Denies nausea, vomiting, abdominal pain, diarrhea, constipation, blood in stool and abdominal distention.  Genitourinary: Positive for dysuria, urgency, frequency flank pain. Endocrine: Denies: hot or cold intolerance, sweats, changes in hair or nails, polyuria, polydipsia. Musculoskeletal: Denies myalgias, back pain, joint swelling, arthralgias and gait problem.  Skin: Denies pallor, rash and wound.  Neurological: Denies dizziness, seizures, syncope,  weakness, light-headedness, numbness and headaches.  Hematological: Denies adenopathy. Easy bruising, personal or family bleeding history  Psychiatric/Behavioral: Denies suicidal ideation, mood changes, confusion, nervousness, sleep disturbance and agitation    Physical Exam: Vitals:   03/21/22 1037 03/21/22 1040  BP: 130/80 124/64  Pulse: 73   Temp: 97.8 F (36.6 C)   TempSrc: Oral   SpO2: 97%   Weight: 185 lb 8 oz (84.1 kg)     Body mass index is 31.84 kg/m.   Constitutional: NAD, calm, comfortable Eyes: PERRL, lids and conjunctivae normal ENMT: Mucous membranes are moist.  Psychiatric: Normal judgment and insight. Alert and oriented x 3. Normal mood.    Impression and Plan:  Acute cystitis with hematuria -Urine dipstick done in office today is positive for blood, leukocytes and protein.  Will send for culture and prescribe Bactrim DS for 7 days.  Need for shingles vaccine -Second shingles vaccine administered in office today.  Essential hypertension -Blood pressures well controlled    Time spent:30 minutes reviewing chart, interviewing and examining patient and formulating plan of care.    Lelon Frohlich, MD [facility] at [facility]

## 2022-03-23 LAB — URINE CULTURE
MICRO NUMBER:: 13858346
SPECIMEN QUALITY:: ADEQUATE

## 2022-03-26 ENCOUNTER — Encounter: Payer: Self-pay | Admitting: Internal Medicine

## 2022-03-26 DIAGNOSIS — H9202 Otalgia, left ear: Secondary | ICD-10-CM

## 2022-04-10 ENCOUNTER — Other Ambulatory Visit: Payer: Self-pay | Admitting: *Deleted

## 2022-04-10 MED ORDER — ROSUVASTATIN CALCIUM 20 MG PO TABS: ORAL_TABLET | ORAL | 1 refills | Status: DC

## 2022-04-10 MED ORDER — AMLODIPINE BESYLATE 5 MG PO TABS: ORAL_TABLET | ORAL | 1 refills | Status: DC

## 2022-04-23 NOTE — Progress Notes (Unsigned)
Initial neurology clinic note  SERVICE DATE: 04/25/22  Reason for Evaluation: Consultation requested by Isla Pence, MD for an opinion regarding left arm weakness. My final recommendations will be communicated back to the requesting physician by way of shared medical record or letter to requesting physician via [place] mail.  HPI: This is [name], a 66 y.o. right-handed female with a medical history of HTN, DM, HLD, GERD, chronic low back pain who presents to neurology clinic with the chief complaint of left arm weakness and left ear "roaring". The patient is alone today.  Patient presented to ED on 02/13/22 for left ear constant "roaring" and left arm weakness that had started 1 day prior. Patient was working that morning. She was on the computer typing. All of the sudden, she felt her left had was completely weak like it wouldn't do anything. She picked her arm up and tried to message it. This did not improve symptoms much. Later in the day, she was doing an in-person training and she had sudden onset of a train like sound only in the left ear. It went on all afternoon that day. She continued to have left arm weakness. She did not have any pain. She thought she would try to sleep it off. The roaring would continue but quieted down while she sleep. The roaring would last a few minutes then stop for about 30 minutes before returning again.  In the ED, CT head and MRI brain were unremarkable. Patient was referred to neurology by the ED.  Since she was in the ED, symptoms have improved (after about 3 weeks). She feels like her ear is stopped up now. Her arm is much better, but her hand is still sometimes weak. She slowed down on lifting weights with her left arm for fear of what that might do. She does feel like she is going in the right direction currently.  She denies symptoms in her right arm or either leg. She denies bowel or bladder changes. She denies double vision, difficulty  swallowing or eating.  Patient was put on a short course of steroids (prednisone 50 mg daily). She is not sure this helped.  The patient has not had similar episodes of symptoms in the past. She denies prior vision loss or focal weakness or numbness.   The patient has not noticed any recent skin rashes nor does she report any constitutional symptoms like fever, night sweats, anorexia or unintentional weight loss.  EtOH use: wine occasionally  Restrictive diet? No Family history of neuropathy/myopathy/NM disease? No  MEDICATIONS:  Outpatient Encounter Medications as of 04/25/2022  Medication Sig   Accu-Chek FastClix Lancets MISC Use as directed once a day   ACCU-CHEK GUIDE test strip Use as instructed once a day   amLODipine (NORVASC) 5 MG tablet TAKE 1 TABLET(5 MG) BY MOUTH DAILY   benazepril (LOTENSIN) 40 MG tablet Take 1 tablet (40 mg total) by mouth daily.   Blood Glucose Monitoring Suppl (ACCU-CHEK GUIDE ME) w/Device KIT 1 each by Does not apply route as directed.   ciclopirox (PENLAC) 8 % solution Apply topically at bedtime. Apply over nail and surrounding skin. Apply daily over previous coat. After seven (7) days, may remove with alcohol and continue cycle.   hydrochlorothiazide (HYDRODIURIL) 25 MG tablet TAKE 1 TABLET(25 MG) BY MOUTH DAILY   montelukast (SINGULAIR) 10 MG tablet Take 1 tablet (10 mg total) by mouth at bedtime.   Multiple Vitamins-Minerals (MULTIVITAMIN WITH MINERALS) tablet Take 1 tablet  by mouth daily.   [DISCONTINUED] hydrochlorothiazide (HYDRODIURIL) 25 MG tablet TAKE 1 TABLET(25 MG) BY MOUTH DAILY   [DISCONTINUED] predniSONE (DELTASONE) 50 MG tablet Take 1 tablet (50 mg total) by mouth daily with breakfast. (Patient not taking: Reported on 04/25/2022)   [DISCONTINUED] rosuvastatin (CRESTOR) 20 MG tablet TAKE 1 TABLET(20 MG) BY MOUTH DAILY (Patient not taking: Reported on 04/25/2022)   [DISCONTINUED] Vitamin D, Ergocalciferol, (DRISDOL) 1.25 MG (50000 UNIT) CAPS  capsule Take 1 capsule (50,000 Units total) by mouth every 7 (seven) days for 12 doses. (Patient not taking: Reported on 04/25/2022)   No facility-administered encounter medications on file as of 04/25/2022.    PAST MEDICAL HISTORY: Past Medical History:  Diagnosis Date   Anemia    intermittent her whole life, reports she had eval and was told to take MV   Eclampsia    GERD (gastroesophageal reflux disease) 08/03/2015   resolved 2016   Hyperglycemia 02/19/2013   Hyperlipidemia 02/19/2013   Hypertension    Obesity 08/03/2015    PAST SURGICAL HISTORY: Past Surgical History:  Procedure Laterality Date   ABDOMINAL HYSTERECTOMY     heavy menses. no concerns for cancer.    CHOLECYSTECTOMY  11/10/12   lap chole with IOC   shoulder shoulder Bilateral    rotator cuff bilat; right shoulder also frozen.     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 No distress. Cooperative with exam.   Skin: No obvious rash or jaundice. HEENT: Atraumatic. Anicteric. Lungs: Non-labored breathing on room air  Heart: Regular, no carotid bruits Extremities: No edema. No obvious deformity.  Musculoskeletal: No obvious joint swelling. Psych: Affect appropriate.  Neurological: Mental Status: Alert. Speech fluent. No pseudobulbar affect Cranial Nerves: CNII: No RAPD. Visual fields grossly intact. CNIII, IV, VI: PERRL. No nystagmus. EOMI. CN V: Facial sensation intact bilaterally to fine touch. CN VII: Facial muscles symmetric and strong. No ptosis at rest. CN VIII: Hearing grossly intact bilaterally. CN IX: No hypophonia. CN X: Palate elevates symmetrically. CN XI: Full strength shoulder shrug bilaterally. CN XII: Tongue protrusion full and midline. No atrophy or fasciculations. No significant dysarthria Motor:  Tone is normal.  Individual muscle group testing (MRC grade out of 5):  Movement     Neck flexion 5    Neck extension 5     Right Left   Shoulder abduction 5 5   Shoulder adduction 5 5   Shoulder ext rotation 5 5   Shoulder int rotation 5 5   Elbow flexion 5 5   Elbow extension 5 5   Wrist extension 5 5   Wrist flexion 5 5   Finger abduction - FDI 5 5   Finger abduction - ADM 5 5   Finger extension 5 5   Finger distal flexion - 2/'3 5 5   ' Finger distal flexion - 4/'5 5 5   ' Thumb flexion - FPL 5 5   Thumb abduction - APB 5 5    Hip flexion 5 5   Knee extension 5 5   Knee flexion 5 5   Dorsiflexion 5 5   Plantarflexion 5 5     Reflexes:  Right Left   Bicep 2+ 2+   Tricep 2+ 2+   BrRad 2+ 2+   Knee 2+ 2+   Ankle 2+ 2+    Pathological Reflexes: Hoffman: Absent bilaterally Troemner: Absent bilaterally Sensation: Pinprick: Intact in all extremities Vibration: Intact in all extremities Coordination: Intact finger-to- nose-finger bilaterally. Romberg negative. Gait: Able to rise from chair with arms crossed unassisted. Normal, narrow-based gait. Able to  tandem walk. Able to walk on toes and heels.  Lab and Test Review: Internal labs: Normal or unremarkable: TSH, CBC CMP remarkable for elevated glucose and mildly elevated Cr (1.09) Vit D (02/18/22): low at 29.41 B12 (02/18/22): 505 HbA1c: 01/15/22: 5.9 10/19/21: 6.1 07/09/21: 7.1  MRI cervical spine (02/28/22): FINDINGS: Alignment: Normal   Vertebrae: Negative for fracture or mass   Cord: Normal signal and morphology.  No cord compression   Posterior Fossa, vertebral arteries, paraspinal tissues: Negative   Disc levels:   C2-3: Negative   C3-4: Negative   C4-5: Negative   C5-6: Disc degeneration and disc bulging with mild spurring. No significant spinal or foraminal stenosis.   C6-7: Negative   C7-T1: Negative   IMPRESSION: Mild degenerative changes cervical spine. Negative for neural impingement or stenosis.  MRI brain wo contrast (02/13/22): FINDINGS: Brain: Cerebral volume within normal limits. Scattered patchy T2/FLAIR hyperintensity involving the deep and subcortical white matter of both cerebral hemispheres, with more mild periventricular involvement. Findings are nonspecific, but most commonly related to chronic microvascular ischemic disease. Overall appearance is mild to moderate in nature.   No evidence for acute or subacute infarct. Gray-white matter differentiation maintained with no areas of chronic cortical infarction. No acute or chronic intracranial blood products.   No mass lesion, midline shift or mass effect. No hydrocephalus or extra-axial fluid collection. Pituitary gland and suprasellar region within normal limits.   Vascular: Major intracranial vascular flow voids are maintained.   Skull and upper cervical spine: Craniocervical junction normal. Bone marrow signal intensity within normal limits. No scalp soft tissue abnormality.   Sinuses/Orbits: Globes and orbital soft tissues within normal limits. Paranasal sinuses are largely clear.  No mastoid effusion.   Other: None.   IMPRESSION: 1. No acute intracranial abnormality. 2. Mild to moderate cerebral white matter disease, nonspecific, but most commonly related to chronic microvascular ischemic disease.  CT head wo contrast (02/13/22): FINDINGS: Brain: The brain shows a normal appearance without evidence of malformation, atrophy, old or acute small or large vessel infarction, mass lesion,  hemorrhage, hydrocephalus or extra-axial collection.   Vascular: No hyperdense vessel. No evidence of atherosclerotic calcification.   Skull: Normal.  No traumatic finding.  No focal bone lesion.   Sinuses/Orbits: Sinuses are clear. Orbits appear normal. Mastoids are clear.   Other: None significant   IMPRESSION: Normal head CT  ASSESSMENT: Erica Sutton is a 66 y.o. female who presents for evaluation of acute onset of left ear "roaring" and left arm weakness. She has a relevant medical history of HTN, DM, HLD, GERD, chronic low back pain. Her neurological examination is essentially normal today. Available diagnostic data is significant for MRI brain and cervical spine without acute pathology and nothing to explain symptoms. The etiology of patient's symptoms is currently unclear. There was no clear stroke or pathology of the cervical spine to explain arm weakness. Exam today is reassuring. Patient's symptoms having essentially resolved except some left ear fullness. I will check a CTA to ensure no intracranial blood vessel pathology to explain roaring heard in left ear. After discussion with patient, if the CTA is okay, we will monitor symptoms carefully. We discussed that she should call with any new or worsening symptoms and go to the nearest ED if there are any stroke like symptoms (focal weakness, numbness, difficulty speaking, face droop, etc)  PLAN: -CTA head and neck w/wo contrast -Patient will carefully monitor symptoms  -Return to clinic to be determined after  CTA  The impression above as well as the plan as outlined below were extensively discussed with the patient who voiced understanding. All questions were answered to their satisfaction.  When available, results of the above investigations and possible further recommendations will be communicated to the patient via telephone/MyChart. Patient to call office if not contacted after expected testing turnaround time.   Total time spent reviewing records, interview, history/exam, documentation, and coordination of care on day of encounter:  40 min   Thank you for allowing me to participate in patient's care.  If I can answer any additional questions, I would be pleased to do so.  Kai Levins, MD   CC: Isaac Bliss, Rayford Halsted, MD [place] [address]  CC: Referring provider: Isla Pence, MD [address] [place],  [place] [address]

## 2022-04-24 ENCOUNTER — Encounter: Payer: Self-pay | Admitting: Internal Medicine

## 2022-04-24 MED ORDER — HYDROCHLOROTHIAZIDE 25 MG PO TABS: ORAL_TABLET | ORAL | 1 refills | Status: DC

## 2022-04-25 ENCOUNTER — Encounter: Payer: Self-pay | Admitting: Neurology

## 2022-04-25 ENCOUNTER — Ambulatory Visit: Payer: 59 | Admitting: Neurology

## 2022-04-25 VITALS — BP 144/88 | HR 70 | Ht 64.0 in | Wt 189.0 lb

## 2022-04-25 DIAGNOSIS — H93292 Other abnormal auditory perceptions, left ear: Secondary | ICD-10-CM

## 2022-04-25 DIAGNOSIS — R29898 Other symptoms and signs involving the musculoskeletal system: Secondary | ICD-10-CM

## 2022-04-25 NOTE — Patient Instructions (Signed)
I saw you today about the left arm weakness and left ear roaring episode. I am not sure the cause of your symptoms. Your MRI brain did not show a stroke or any other clear cause.  I would like to look at the blood vessels in your brain to make sure that is not a problem.  If that looks [place], given that you are improving, I would recommend monitoring your symptoms. You should call us if you have new or worsening symptoms.  You should go to the nearest emergency room if you have new stroke symptoms (weakness or numbness on one side of the body, difficulty speaking, face drooping, etc).  I will be in touch when I see the results of your CTA.  Please let me know if you have any questions or concerns in the meantime.  The physicians and staff at [facility] are committed to providing excellent care. You may receive a survey requesting feedback about your experience at our office. We strive to receive "very [place]" responses to the survey questions. If you feel that your experience would prevent you from giving the office a "very Rix " response, please contact our office to try to remedy the situation. We may be reached at [phone]. Thank you for taking the time out of your busy day to complete the survey.  Kai Levins, MD [facility]

## 2022-05-08 ENCOUNTER — Encounter: Payer: Self-pay | Admitting: Internal Medicine

## 2022-05-10 ENCOUNTER — Encounter: Payer: Self-pay | Admitting: Internal Medicine

## 2022-05-10 ENCOUNTER — Other Ambulatory Visit: Payer: Self-pay | Admitting: *Deleted

## 2022-05-10 DIAGNOSIS — Z13820 Encounter for screening for osteoporosis: Secondary | ICD-10-CM

## 2022-05-13 MED ORDER — BENAZEPRIL HCL 40 MG PO TABS: 40.0000 mg | ORAL_TABLET | Freq: Every day | ORAL | 1 refills | Status: DC

## 2022-05-23 ENCOUNTER — Ambulatory Visit
Admission: RE | Admit: 2022-05-23 | Discharge: 2022-05-23 | Disposition: A | Discharge status: Home / Self Care | Payer: 59 | Source: Ambulatory Visit | Attending: Neurology | Admitting: Neurology

## 2022-05-23 DIAGNOSIS — H93292 Other abnormal auditory perceptions, left ear: Secondary | ICD-10-CM

## 2022-05-23 DIAGNOSIS — R29898 Other symptoms and signs involving the musculoskeletal system: Secondary | ICD-10-CM

## 2022-05-23 MED ORDER — IOPAMIDOL (ISOVUE-370) INJECTION 76%
75.0000 mL | Freq: Once | INTRAVENOUS | Status: AC | PRN
  Administered 2022-05-23: 75 mL via INTRAVENOUS

## 2022-05-27 ENCOUNTER — Ambulatory Visit (INDEPENDENT_AMBULATORY_CARE_PROVIDER_SITE_OTHER)
Admission: RE | Admit: 2022-05-27 | Discharge: 2022-05-27 | Disposition: A | Discharge status: Home / Self Care | Payer: 59 | Source: Ambulatory Visit | Attending: Internal Medicine | Admitting: Internal Medicine

## 2022-05-27 DIAGNOSIS — Z13820 Encounter for screening for osteoporosis: Secondary | ICD-10-CM

## 2022-06-04 ENCOUNTER — Ambulatory Visit (INDEPENDENT_AMBULATORY_CARE_PROVIDER_SITE_OTHER): Payer: 59

## 2022-06-04 DIAGNOSIS — Z23 Encounter for immunization: Secondary | ICD-10-CM | POA: Diagnosis not present

## 2022-06-12 ENCOUNTER — Encounter: Payer: Self-pay | Admitting: Internal Medicine

## 2022-06-12 MED ORDER — BENAZEPRIL HCL 40 MG PO TABS: 40.0000 mg | ORAL_TABLET | Freq: Every day | ORAL | 1 refills | Status: DC

## 2022-06-21 ENCOUNTER — Other Ambulatory Visit: Payer: Self-pay | Admitting: Internal Medicine

## 2022-06-21 DIAGNOSIS — Z1231 Encounter for screening mammogram for malignant neoplasm of breast: Secondary | ICD-10-CM

## 2022-07-16 ENCOUNTER — Other Ambulatory Visit: Payer: 59

## 2022-08-14 ENCOUNTER — Ambulatory Visit
Admission: RE | Admit: 2022-08-14 | Discharge: 2022-08-14 | Disposition: A | Discharge status: Home / Self Care | Payer: 59 | Source: Ambulatory Visit | Attending: Internal Medicine | Admitting: Internal Medicine

## 2022-08-14 DIAGNOSIS — Z1231 Encounter for screening mammogram for malignant neoplasm of breast: Secondary | ICD-10-CM

## 2022-10-04 ENCOUNTER — Other Ambulatory Visit: Payer: Self-pay | Admitting: Internal Medicine

## 2022-10-30 ENCOUNTER — Telehealth: Payer: Self-pay | Admitting: Internal Medicine

## 2022-10-30 MED ORDER — BENAZEPRIL HCL 40 MG PO TABS: 40.0000 mg | ORAL_TABLET | Freq: Every day | ORAL | 0 refills | Status: DC

## 2022-10-30 NOTE — Telephone Encounter (Signed)
Prescription Request  10/30/2022  LOV: 03/21/2022  What is the name of the medication or equipment? benazepril (LOTENSIN) 40 MG tablet requesting 90d supply  Have you contacted your pharmacy to request a refill? No   Which pharmacy would you like this sent to?  [facility] [phone] - [place], [place] - [address] AT [facility] [address] [place] [address] Phone: [phone] Fax: [phone]    Patient notified that their request is being sent to the clinical staff for review and that they should receive a response within 2 business days.   Please advise at Mobile [phone] (mobile)

## 2022-10-30 NOTE — Telephone Encounter (Signed)
Rx done. 

## 2022-12-03 ENCOUNTER — Ambulatory Visit (INDEPENDENT_AMBULATORY_CARE_PROVIDER_SITE_OTHER): Payer: Medicare Other | Admitting: Internal Medicine

## 2022-12-03 ENCOUNTER — Encounter: Payer: Self-pay | Admitting: Internal Medicine

## 2022-12-03 VITALS — BP 124/80 | HR 69 | Temp 97.4°F | Wt 188.2 lb

## 2022-12-03 DIAGNOSIS — M5441 Lumbago with sciatica, right side: Secondary | ICD-10-CM

## 2022-12-03 MED ORDER — PREDNISONE 10 MG (21) PO TBPK: ORAL_TABLET | ORAL | 0 refills | Status: DC

## 2022-12-03 MED ORDER — MELOXICAM 7.5 MG PO TABS: 7.5000 mg | ORAL_TABLET | Freq: Every day | ORAL | 0 refills | Status: DC

## 2022-12-03 NOTE — Progress Notes (Signed)
Established Patient Office Visit     CC/Reason for Visit: Right-sided lower  HPI: Erica Sutton is a 67 y.o. female who is coming in today for the above mentioned reasons.  She was on vacation last week with her grandchildren.  While trying to get out of the pool she noted pain in her right lower back that radiated down her buttock and the back of her leg stopped right at about the knee area.  It has improved somewhat with Tylenol but is still persistent.  No saddle anesthesia, no bowel or bladder incontinence.   Past Medical/Surgical History: Past Medical History:  Diagnosis Date   Anemia    intermittent her whole life, reports she had eval and was told to take MV   Eclampsia    GERD (gastroesophageal reflux disease) 08/03/2015   resolved 2016   Hyperglycemia 02/19/2013   Hyperlipidemia 02/19/2013   Hypertension    Obesity 08/03/2015    Past Surgical History:  Procedure Laterality Date   ABDOMINAL HYSTERECTOMY     heavy menses. no concerns for cancer.    CHOLECYSTECTOMY  11/10/12   lap chole with IOC   shoulder shoulder Bilateral    rotator cuff bilat; right shoulder also frozen.     Social History:  reports that she has quit smoking. She has never used smokeless tobacco. She reports current alcohol use of about 3.0 standard drinks of alcohol per week. She reports that she does not use drugs.  Allergies: No Known Allergies  Family History:  Family History  Problem Relation Age of Onset   Diabetes Mother    Liver disease Father    Alcohol abuse Father    Healthy Brother    Stroke Sister 20     Current Outpatient Medications:    Accu-Chek FastClix Lancets MISC, Use as directed once a day, Disp: 102 each, Rfl: 3   ACCU-CHEK GUIDE test strip, Use as instructed once a day, Disp: 100 each, Rfl: 3   amLODipine (NORVASC) 5 MG tablet, TAKE 1 TABLET(5 MG) BY MOUTH DAILY, Disp: 90 tablet, Rfl: 1   benazepril (LOTENSIN) 40 MG tablet, Take 1 tablet (40 mg total) by mouth  daily., Disp: 90 tablet, Rfl: 0   Blood Glucose Monitoring Suppl (ACCU-CHEK GUIDE ME) w/Device KIT, 1 each by Does not apply route as directed., Disp: 1 kit, Rfl: 0   ciclopirox (PENLAC) 8 % solution, Apply topically at bedtime. Apply over nail and surrounding skin. Apply daily over previous coat. After seven (7) days, may remove with alcohol and continue cycle., Disp: 6.6 mL, Rfl: 5   hydrochlorothiazide (HYDRODIURIL) 25 MG tablet, TAKE 1 TABLET(25 MG) BY MOUTH DAILY, Disp: 90 tablet, Rfl: 1   meloxicam (MOBIC) 7.5 MG tablet, Take 1 tablet (7.5 mg total) by mouth daily., Disp: 30 tablet, Rfl: 0   montelukast (SINGULAIR) 10 MG tablet, Take 1 tablet (10 mg total) by mouth at bedtime., Disp: 30 tablet, Rfl: 0   Multiple Vitamins-Minerals (MULTIVITAMIN WITH MINERALS) tablet, Take 1 tablet by mouth daily., Disp: , Rfl:    predniSONE (STERAPRED UNI-PAK 21 TAB) 10 MG (21) TBPK tablet, Take as directed, Disp: 21 tablet, Rfl: 0   rosuvastatin (CRESTOR) 20 MG tablet, TAKE 1 TABLET(20 MG) BY MOUTH DAILY, Disp: 90 tablet, Rfl: 1  Review of Systems:  Negative unless indicated in HPI.   Physical Exam: Vitals:   12/03/22 1600  BP: 124/80  Pulse: 69  Temp: (!) 97.4 F (36.3 C)  TempSrc: Oral  SpO2: 98%  Weight: 188 lb 3.2 oz (85.4 kg)    Body mass index is 32.3 kg/m.   Physical Exam Vitals reviewed.  Constitutional:      Appearance: Normal appearance.  HENT:     Head: Normocephalic and atraumatic.  Eyes:     Conjunctiva/sclera: Conjunctivae normal.     Pupils: Pupils are equal, round, and reactive to light.  Pulmonary:     Effort: Pulmonary effort is normal.  Skin:    General: Skin is warm and dry.  Neurological:     General: No focal deficit present.     Mental Status: She is alert and oriented to person, place, and time.     Comments: Pain to palpation of right lower back  Psychiatric:        Mood and Affect: Mood normal.        Behavior: Behavior normal.        Thought Content:  Thought content normal.        Judgment: Judgment normal.      Impression and Plan:  Acute right-sided low back pain with right-sided sciatica -     predniSONE; Take as directed  Dispense: 21 tablet; Refill: 0 -     Meloxicam; Take 1 tablet (7.5 mg total) by mouth daily.  Dispense: 30 tablet; Refill: 0 -     Ambulatory referral to Physical Therapy  -Suspect this is low back pain with sciatica. -Advised icing, meloxicam and a prednisone taper. -PT referral has also been placed.    Time spent:31 minutes spent today reviewing chart, interviewing and examining patient and formulating plan of care. We discussed the importance of PT.     Chaya Jan, MD [facility]

## 2022-12-10 ENCOUNTER — Encounter: Payer: Self-pay | Admitting: Rehabilitative and Restorative Service Providers"

## 2022-12-10 ENCOUNTER — Ambulatory Visit: Payer: Medicare Other | Attending: Internal Medicine | Admitting: Rehabilitative and Restorative Service Providers"

## 2022-12-10 ENCOUNTER — Other Ambulatory Visit: Payer: Self-pay

## 2022-12-10 DIAGNOSIS — M5441 Lumbago with sciatica, right side: Secondary | ICD-10-CM | POA: Insufficient documentation

## 2022-12-10 DIAGNOSIS — R252 Cramp and spasm: Secondary | ICD-10-CM | POA: Insufficient documentation

## 2022-12-10 DIAGNOSIS — M6281 Muscle weakness (generalized): Secondary | ICD-10-CM | POA: Diagnosis not present

## 2022-12-10 NOTE — Therapy (Signed)
OUTPATIENT PHYSICAL THERAPY THORACOLUMBAR EVALUATION   Patient Name: Erica Sutton MRN: [id number] DOB:09/01/1955, 67 y.o., female Today's Date: 12/10/2022  END OF SESSION:  PT End of Session - 12/10/22 1154     Visit Number 1    Date for PT Re-Evaluation 01/31/23    Authorization Type [employer] Medicare    Progress Note Due on Visit 10    PT Start Time 1145    PT Stop Time 1225    PT Time Calculation (min) 40 min    Activity Tolerance Patient tolerated treatment well    Behavior During Therapy WFL for tasks assessed/performed             Past Medical History:  Diagnosis Date   Anemia    intermittent her whole life, reports she had eval and was told to take MV   Eclampsia    GERD (gastroesophageal reflux disease) 08/03/2015   resolved 2016   Hyperglycemia 02/19/2013   Hyperlipidemia 02/19/2013   Hypertension    Obesity 08/03/2015   Past Surgical History:  Procedure Laterality Date   ABDOMINAL HYSTERECTOMY     heavy menses. no concerns for cancer.    CHOLECYSTECTOMY  11/10/12   lap chole with IOC   shoulder shoulder Bilateral    rotator cuff bilat; right shoulder also frozen.    Patient Active Problem List   Diagnosis Date Noted   Vitamin D deficiency 02/18/2022   Hyperlipidemia associated with type 2 diabetes mellitus (HCC) 10/02/2017   Type 2 diabetes mellitus without complication, without long-term current use of insulin (HCC) 10/02/2017   Morbid obesity (HCC) 08/03/2015   Essential hypertension 01/19/2007    PCP: Philip Aspen, Limmie Patricia, MD  REFERRING PROVIDER: Philip Aspen, Limmie Patricia, MD  REFERRING DIAG: [phone] (ICD-10-CM) - Acute right-sided low back pain with right-sided sciatica  Rationale for Evaluation and Treatment: Rehabilitation  THERAPY DIAG:  Low back pain with right-sided sciatica, unspecified back pain laterality, unspecified chronicity  Cramp and spasm  Muscle weakness (generalized)  ONSET DATE: early May when on  vacation  SUBJECTIVE:                                                                                                                                                                                           SUBJECTIVE STATEMENT: Patient was on vacation with grandchildren and when trying to get out of the hot tub, she noted pain in her right lower pack that radiated down her leg.  Pt reports that the pain initially, but has started to improve since starting steroid dose pack and muscle relaxer.  PERTINENT HISTORY:  Anemia, HTN, bilateral shoulder rotator  cuff repair  PAIN:  Are you having pain? Yes: NPRS scale: 6/10 Pain location: low back, radiating down right leg Pain description: stabbing Aggravating factors: movement Relieving factors: rest  PRECAUTIONS: None  WEIGHT BEARING RESTRICTIONS: No  FALLS:  Has patient fallen in last 6 months? No  LIVING ENVIRONMENT: Lives with: lives with their family Lives in: House/apartment Stairs: Yes: External: 3 steps; can reach both Has following equipment at home: None  OCCUPATION: Recently Retired from working for [employer] in the [employer] department  PLOF: Independent and Leisure: travel, decorating  PATIENT GOALS: To feel better.  NEXT MD VISIT: As needed  OBJECTIVE:   DIAGNOSTIC FINDINGS:  Bone Density on 05/27/2022: Lumbar T score +0.4 Femoral Neck T score:  right- +0.1, left -0.3 Fracture Risk:  low  PATIENT SURVEYS:  Eval:  FOTO 56% (projected 72% by visit 10)  SCREENING FOR RED FLAGS: Bowel or bladder incontinence: No Spinal tumors: No Cauda equina syndrome: No Compression fracture: No Abdominal aneurysm: No  COGNITION: Overall cognitive status: Within functional limits for tasks assessed     SENSATION: WFL  MUSCLE LENGTH: Hamstrings: some tightness noted in right side  POSTURE: rounded shoulders and forward head  PALPATION: Denies tenderness to palpation, but does have some tightness/spasms in right  side lumbar paraspinals and gluteal region  LUMBAR ROM:   12/10/2022:  WFL  LOWER EXTREMITY ROM:     12/10/2022:  WFL  LOWER EXTREMITY MMT:    12/10/2022:   Right hip strength is 4 to 4+/5 grossly throughout, right hamstring is 4+/5 Left LE strength is WFL  LUMBAR SPECIAL TESTS:   12/10/2022: Slump test: Positive  FUNCTIONAL TESTS:   12/10/2022: 5 times sit to stand: 8.59 sec without UE use Timed up and go (TUG): 7.03 ec  GAIT: Distance walked: >500 ft Assistive device utilized: None Level of assistance: Complete Independence Comments: Pt did not report increased pain with shorter distance ambulation  TODAY'S TREATMENT:                                                                                                                              DATE: 12/10/2022  Reviewed HEP (see below) and perform one set of each   PATIENT EDUCATION:  Education details: Issued HEP Person educated: Patient Education method: Explanation Education comprehension: verbalized understanding and returned demonstration  HOME EXERCISE PROGRAM: Access Code: [email or web address] URL: [email or web address] Date: 12/10/2022 Prepared by: Clydie Braun Nabria Nevin  Exercises - Supine Lower Trunk Rotation  - 1-2 x daily - 7 x weekly - 1 sets - 10 reps - Supine Posterior Pelvic Tilt  - 1-2 x daily - 7 x weekly - 2 sets - 10 reps - Supine Piriformis Stretch with Foot on Ground  - 1-2 x daily - 7 x weekly - 1 sets - 2 reps - 20 sec hold - Supine Sciatic Nerve Glide  - 1 x daily - 7 x weekly - 1 sets - 10 reps -  1 sec hold - Supine Transversus Abdominis Bracing - Hands on Thighs  - 1-2 x daily - 7 x weekly - 2 sets - 10 reps - Standing 'L' Stretch at Counter  - 2 x daily - 7 x weekly - 1 sets - 2 reps - 20 sec hold - Seated Piriformis Stretch  - 1-2 x daily - 7 x weekly - 1 sets - 2 reps - 20 sec hold - Seated Sciatic Tensioner  - 1-2 x daily - 7 x weekly - 1-2 sets - 10 reps  ASSESSMENT:  CLINICAL  IMPRESSION: Patient is a 67 y.o. female who was seen today for physical therapy evaluation and treatment for low back pain with right sided sciatica. Patient was in her usual health until approximately 3 weeks ago when she started having pain while on vacation.  Patient presents with right hip/hamstring weakness, increased pain, and muscle spasms.  Patient would benefit from skilled PT to allow her to return to her desired activities without increased pain.  OBJECTIVE IMPAIRMENTS: difficulty walking, decreased strength, increased muscle spasms, improper body mechanics, postural dysfunction, and pain.   ACTIVITY LIMITATIONS: lifting and bending  PARTICIPATION LIMITATIONS: community activity  PERSONAL FACTORS: 1 comorbidity: HTN  are also affecting patient's functional outcome.   REHAB POTENTIAL: [place]  CLINICAL DECISION MAKING: Stable/uncomplicated  EVALUATION COMPLEXITY: Low   GOALS: Goals reviewed with patient? Yes  SHORT TERM GOALS: Target date: 12/27/2022  Pt will be independent with initial HEP. Baseline: Goal status: INITIAL  2.  Patient will report at least a 40% improvement in symptoms since starting PT. Baseline:  Goal status: INITIAL   LONG TERM GOALS: Target date: 01/31/2023  Pt will be independent with advanced HEP. Baseline:  Goal status: INITIAL  2.  Patient will increase FOTO to at least 72% to demonstrate improvements in functional mobility. Baseline: 56% Goal status: INITIAL  3.  Patient will increase right hip and hamstring strength to at least 4+ to 5-/5 to improve gait pattern and ability to navigate stairs. Baseline:  Goal status: INITIAL  4.  Patient will report ability to perform desired community activities and ambulation without increased pain. Baseline:  Goal status: INITIAL   PLAN:  PT FREQUENCY: 1-2x/week  PT DURATION: 8 weeks  PLANNED INTERVENTIONS: Therapeutic exercises, Therapeutic activity, Neuromuscular re-education, Balance  training, Gait training, Patient/Family education, Self Care, Joint mobilization, Joint manipulation, Stair training, Aquatic Therapy, Dry Needling, Electrical stimulation, Cryotherapy, Moist heat, Taping, Traction, Ultrasound, Ionotophoresis 4mg /ml Dexamethasone, Manual therapy, and Re-evaluation.  PLAN FOR NEXT SESSION: Assess and progress HEP as indicated, strengthening, core stability, flexibility   Reather Laurence, PT 12/10/2022, 12:57 PM   [facility] [address], [address] [place], [place] [address] Phone # [phone] Fax [phone]

## 2022-12-12 ENCOUNTER — Ambulatory Visit: Payer: Medicare Other | Admitting: Rehabilitative and Restorative Service Providers"

## 2022-12-17 ENCOUNTER — Encounter: Payer: Self-pay | Admitting: Rehabilitative and Restorative Service Providers"

## 2022-12-17 ENCOUNTER — Ambulatory Visit: Payer: Medicare Other | Admitting: Rehabilitative and Restorative Service Providers"

## 2022-12-17 DIAGNOSIS — M5441 Lumbago with sciatica, right side: Secondary | ICD-10-CM | POA: Diagnosis not present

## 2022-12-17 DIAGNOSIS — R252 Cramp and spasm: Secondary | ICD-10-CM

## 2022-12-17 DIAGNOSIS — M6281 Muscle weakness (generalized): Secondary | ICD-10-CM

## 2022-12-17 NOTE — Therapy (Signed)
OUTPATIENT PHYSICAL THERAPY TREATMENT NOTE   Patient Name: Erica Sutton MRN: [id number] DOB:Apr 11, 1956, 67 y.o., female Today's Date: 12/17/2022  END OF SESSION:  PT End of Session - 12/17/22 0935     Visit Number 2    Date for PT Re-Evaluation 01/31/23    Authorization Type [employer] Medicare    Progress Note Due on Visit 10    PT Start Time 0932    PT Stop Time 1010    PT Time Calculation (min) 38 min    Activity Tolerance Patient tolerated treatment well    Behavior During Therapy WFL for tasks assessed/performed             Past Medical History:  Diagnosis Date   Anemia    intermittent her whole life, reports she had eval and was told to take MV   Eclampsia    GERD (gastroesophageal reflux disease) 08/03/2015   resolved 2016   Hyperglycemia 02/19/2013   Hyperlipidemia 02/19/2013   Hypertension    Obesity 08/03/2015   Past Surgical History:  Procedure Laterality Date   ABDOMINAL HYSTERECTOMY     heavy menses. no concerns for cancer.    CHOLECYSTECTOMY  11/10/12   lap chole with IOC   shoulder shoulder Bilateral    rotator cuff bilat; right shoulder also frozen.    Patient Active Problem List   Diagnosis Date Noted   Vitamin D deficiency 02/18/2022   Hyperlipidemia associated with type 2 diabetes mellitus (HCC) 10/02/2017   Type 2 diabetes mellitus without complication, without long-term current use of insulin (HCC) 10/02/2017   Morbid obesity (HCC) 08/03/2015   Essential hypertension 01/19/2007    PCP: Philip Aspen, Limmie Patricia, MD  REFERRING PROVIDER: Philip Aspen, Limmie Patricia, MD  REFERRING DIAG: [phone] (ICD-10-CM) - Acute right-sided low back pain with right-sided sciatica  Rationale for Evaluation and Treatment: Rehabilitation  THERAPY DIAG:  Low back pain with right-sided sciatica, unspecified back pain laterality, unspecified chronicity  Muscle weakness (generalized)  Cramp and spasm  ONSET DATE: early May when on vacation  SUBJECTIVE:                                                                                                                                                                                            SUBJECTIVE STATEMENT: Patient reports HEP is going well and seems to be helping.  Reports feeling at least 30% better since initial evaluation.  PERTINENT HISTORY:  Anemia, HTN, bilateral shoulder rotator cuff repair  PAIN:  Are you having pain? Yes: NPRS scale: 4/10 Pain location: low back, radiating down right leg Pain description: stabbing Aggravating factors: movement Relieving  factors: rest  PRECAUTIONS: None  WEIGHT BEARING RESTRICTIONS: No  FALLS:  Has patient fallen in last 6 months? No  LIVING ENVIRONMENT: Lives with: lives with their family Lives in: House/apartment Stairs: Yes: External: 3 steps; can reach both Has following equipment at home: None  OCCUPATION: Recently Retired from working for [employer] in the [employer] department  PLOF: Independent and Leisure: travel, decorating  PATIENT GOALS: To feel better.  NEXT MD VISIT: As needed  OBJECTIVE:   DIAGNOSTIC FINDINGS:  Bone Density on 05/27/2022: Lumbar T score +0.4 Femoral Neck T score:  right- +0.1, left -0.3 Fracture Risk:  low  PATIENT SURVEYS:  Eval:  FOTO 56% (projected 72% by visit 10)  SCREENING FOR RED FLAGS: Bowel or bladder incontinence: No Spinal tumors: No Cauda equina syndrome: No Compression fracture: No Abdominal aneurysm: No  COGNITION: Overall cognitive status: Within functional limits for tasks assessed     SENSATION: WFL  MUSCLE LENGTH: Hamstrings: some tightness noted in right side  POSTURE: rounded shoulders and forward head  PALPATION: Denies tenderness to palpation, but does have some tightness/spasms in right side lumbar paraspinals and gluteal region  LUMBAR ROM:   12/10/2022:  WFL  LOWER EXTREMITY ROM:     12/10/2022:  WFL  LOWER EXTREMITY MMT:    12/10/2022:   Right hip  strength is 4 to 4+/5 grossly throughout, right hamstring is 4+/5 Left LE strength is WFL  LUMBAR SPECIAL TESTS:   12/10/2022: Slump test: Positive  FUNCTIONAL TESTS:   12/10/2022: 5 times sit to stand: 8.59 sec without UE use Timed up and go (TUG): 7.03 ec  GAIT: Distance walked: >500 ft Assistive device utilized: None Level of assistance: Complete Independence Comments: Pt did not report increased pain with shorter distance ambulation  TODAY'S TREATMENT:                                                                                                                               DATE: 12/17/2022  Nustep level 4 x6 min with PT present to discuss status Seated sciatic nerve tensioner x10 bilat Supine hamstring stretch with strap 2x20 sec bilat Supine IT band stretch with strap 2x20 sec bilat Supine lower trunk rotation x10 bilat Supine hip adduction ball squeeze 10x5 sec hold Supine TA contraction with hands pressing into small ball on thighs 10x5 sec Supine 90/90 heel tap 2x10 bilat Side-lying clamshells 2x10 bilat Supine piriformis stretch 2x20 sec bilat Prone hamstring curl x10 bilat Manual:  Addaday to bilateral lumbar paraspinals, bilat glute/piriformis, bilat hamstrings, and bilat IT band.   DATE: 12/10/2022  Reviewed HEP (see below) and perform one set of each   PATIENT EDUCATION:  Education details: Issued HEP Person educated: Patient Education method: Explanation Education comprehension: verbalized understanding and returned demonstration  HOME EXERCISE PROGRAM: Access Code: [email or web address] URL: [email or web address] Date: 12/10/2022 Prepared by: Reather Laurence  Exercises - Supine Lower Trunk Rotation  - 1-2 x daily - 7 x  weekly - 1 sets - 10 reps - Supine Posterior Pelvic Tilt  - 1-2 x daily - 7 x weekly - 2 sets - 10 reps - Supine Piriformis Stretch with Foot on Ground  - 1-2 x daily - 7 x weekly - 1 sets - 2 reps - 20 sec hold - Supine Sciatic  Nerve Glide  - 1 x daily - 7 x weekly - 1 sets - 10 reps - 1 sec hold - Supine Transversus Abdominis Bracing - Hands on Thighs  - 1-2 x daily - 7 x weekly - 2 sets - 10 reps - Standing 'L' Stretch at Counter  - 2 x daily - 7 x weekly - 1 sets - 2 reps - 20 sec hold - Seated Piriformis Stretch  - 1-2 x daily - 7 x weekly - 1 sets - 2 reps - 20 sec hold - Seated Sciatic Tensioner  - 1-2 x daily - 7 x weekly - 1-2 sets - 10 reps  ASSESSMENT:  CLINICAL IMPRESSION: Ms Jamison presents to skilled PT reporting at least 30% improvement since initial evaluation.  Patient able to properly demonstrate HEP performed during session today with minimal cuing.  Added in some new exercises to focus on core stability and hip strengthening.  Opted to utilize Addaday for manual therapy at the end of session to assist with muscle tightness at end of session with patient reporting decreased pain following.  Patient continues to progress towards goal related activities.  OBJECTIVE IMPAIRMENTS: difficulty walking, decreased strength, increased muscle spasms, improper body mechanics, postural dysfunction, and pain.   ACTIVITY LIMITATIONS: lifting and bending  PARTICIPATION LIMITATIONS: community activity  PERSONAL FACTORS: 1 comorbidity: HTN  are also affecting patient's functional outcome.   REHAB POTENTIAL: [place]  CLINICAL DECISION MAKING: Stable/uncomplicated  EVALUATION COMPLEXITY: Low   GOALS: Goals reviewed with patient? Yes  SHORT TERM GOALS: Target date: 12/27/2022  Pt will be independent with initial HEP. Baseline: Goal status: IN PROGRESS  2.  Patient will report at least a 40% improvement in symptoms since starting PT. Baseline:  Goal status: IN PROGRESS   LONG TERM GOALS: Target date: 01/31/2023  Pt will be independent with advanced HEP. Baseline:  Goal status: INITIAL  2.  Patient will increase FOTO to at least 72% to demonstrate improvements in functional mobility. Baseline: 56% Goal  status: INITIAL  3.  Patient will increase right hip and hamstring strength to at least 4+ to 5-/5 to improve gait pattern and ability to navigate stairs. Baseline:  Goal status: INITIAL  4.  Patient will report ability to perform desired community activities and ambulation without increased pain. Baseline:  Goal status: INITIAL   PLAN:  PT FREQUENCY: 1-2x/week  PT DURATION: 8 weeks  PLANNED INTERVENTIONS: Therapeutic exercises, Therapeutic activity, Neuromuscular re-education, Balance training, Gait training, Patient/Family education, Self Care, Joint mobilization, Joint manipulation, Stair training, Aquatic Therapy, Dry Needling, Electrical stimulation, Cryotherapy, Moist heat, Taping, Traction, Ultrasound, Ionotophoresis 4mg /ml Dexamethasone, Manual therapy, and Re-evaluation.  PLAN FOR NEXT SESSION: Assess and progress HEP as indicated, strengthening, core stability, flexibility   Reather Laurence, PT 12/17/2022, 11:00 AM   [facility] [address] [place], [place] [address] Phone # [phone] Fax [phone]

## 2022-12-24 ENCOUNTER — Encounter: Payer: Self-pay | Admitting: Rehabilitative and Restorative Service Providers"

## 2022-12-24 ENCOUNTER — Ambulatory Visit: Payer: Medicare Other | Attending: Internal Medicine | Admitting: Rehabilitative and Restorative Service Providers"

## 2022-12-24 DIAGNOSIS — M5441 Lumbago with sciatica, right side: Secondary | ICD-10-CM | POA: Insufficient documentation

## 2022-12-24 DIAGNOSIS — R252 Cramp and spasm: Secondary | ICD-10-CM | POA: Diagnosis not present

## 2022-12-24 DIAGNOSIS — M6281 Muscle weakness (generalized): Secondary | ICD-10-CM | POA: Insufficient documentation

## 2022-12-24 NOTE — Therapy (Signed)
OUTPATIENT PHYSICAL THERAPY TREATMENT NOTE   Patient Name: Erica Sutton MRN: [id number] DOB:1956-06-23, 67 y.o., female Today's Date: 12/24/2022  END OF SESSION:  PT End of Session - 12/24/22 1148     Visit Number 3    Date for PT Re-Evaluation 01/31/23    Authorization Type [employer]    Progress Note Due on Visit 10    PT Start Time 1147    PT Stop Time 1010    PT Time Calculation (min) 1343 min    Activity Tolerance Patient tolerated treatment well    Behavior During Therapy WFL for tasks assessed/performed             Past Medical History:  Diagnosis Date   Anemia    intermittent her whole life, reports she had eval and was told to take MV   Eclampsia    GERD (gastroesophageal reflux disease) 08/03/2015   resolved 2016   Hyperglycemia 02/19/2013   Hyperlipidemia 02/19/2013   Hypertension    Obesity 08/03/2015   Past Surgical History:  Procedure Laterality Date   ABDOMINAL HYSTERECTOMY     heavy menses. no concerns for cancer.    CHOLECYSTECTOMY  11/10/12   lap chole with IOC   shoulder shoulder Bilateral    rotator cuff bilat; right shoulder also frozen.    Patient Active Problem List   Diagnosis Date Noted   Vitamin D deficiency 02/18/2022   Hyperlipidemia associated with type 2 diabetes mellitus (HCC) 10/02/2017   Type 2 diabetes mellitus without complication, without long-term current use of insulin (HCC) 10/02/2017   Morbid obesity (HCC) 08/03/2015   Essential hypertension 01/19/2007    PCP: Philip Aspen, Limmie Patricia, MD  REFERRING PROVIDER: Philip Aspen, Limmie Patricia, MD  REFERRING DIAG: [phone] (ICD-10-CM) - Acute right-sided low back pain with right-sided sciatica  Rationale for Evaluation and Treatment: Rehabilitation  THERAPY DIAG:  Low back pain with right-sided sciatica, unspecified back pain laterality, unspecified chronicity  Muscle weakness (generalized)  Cramp and spasm  ONSET DATE: early May when on vacation  SUBJECTIVE:                                                                                                                                                                                            SUBJECTIVE STATEMENT: Patient reports feeling okay after last session.  PERTINENT HISTORY:  Anemia, HTN, bilateral shoulder rotator cuff repair  PAIN:  Are you having pain? Yes: NPRS scale: 5/10 Pain location: low back, radiating down right leg Pain description: stabbing Aggravating factors: movement Relieving factors: rest  PRECAUTIONS: None  WEIGHT BEARING RESTRICTIONS: No  FALLS:  Has  patient fallen in last 6 months? No  LIVING ENVIRONMENT: Lives with: lives with their family Lives in: House/apartment Stairs: Yes: External: 3 steps; can reach both Has following equipment at home: None  OCCUPATION: Recently Retired from working for [employer] in the [employer] department  PLOF: Independent and Leisure: travel, decorating  PATIENT GOALS: To feel better.  NEXT MD VISIT: As needed  OBJECTIVE:   DIAGNOSTIC FINDINGS:  Bone Density on 05/27/2022: Lumbar T score +0.4 Femoral Neck T score:  right- +0.1, left -0.3 Fracture Risk:  low  PATIENT SURVEYS:  Eval:  FOTO 56% (projected 72% by visit 10)  SCREENING FOR RED FLAGS: Bowel or bladder incontinence: No Spinal tumors: No Cauda equina syndrome: No Compression fracture: No Abdominal aneurysm: No  COGNITION: Overall cognitive status: Within functional limits for tasks assessed     SENSATION: WFL  MUSCLE LENGTH: Hamstrings: some tightness noted in right side  POSTURE: rounded shoulders and forward head  PALPATION: Denies tenderness to palpation, but does have some tightness/spasms in right side lumbar paraspinals and gluteal region  LUMBAR ROM:   12/10/2022:  WFL  LOWER EXTREMITY ROM:     12/10/2022:  WFL  LOWER EXTREMITY MMT:    12/10/2022:   Right hip strength is 4 to 4+/5 grossly throughout, right hamstring is 4+/5 Left LE  strength is WFL  LUMBAR SPECIAL TESTS:   12/10/2022: Slump test: Positive  FUNCTIONAL TESTS:   12/10/2022: 5 times sit to stand: 8.59 sec without UE use Timed up and go (TUG): 7.03 ec  GAIT: Distance walked: >500 ft Assistive device utilized: None Level of assistance: Complete Independence Comments: Pt did not report increased pain with shorter distance ambulation  TODAY'S TREATMENT:                                                                                                                               DATE: 12/24/2022  Nustep level 5 x6 min with PT present to discuss status Seated sciatic nerve tensioner x10 bilat Supine hamstring stretch with strap 2x20 sec bilat Supine IT band stretch with strap 2x20 sec bilat Supine piriformis stretch 2x20 sec bilat Supine lower trunk rotation with LE on red pball x10 bilat Supine bridge with LE on red pball 2x10 Supine 90/90 heel tap 2x10 bilat Side-lying clamshells 2x10 bilat Hip Matrix 40# for extension and abduction x12 each bilat Leg Press (seat at 7) 90# 2x10 Standing rockerboard DF/PF x2 min   DATE: 12/17/2022  Nustep level 4 x6 min with PT present to discuss status Seated sciatic nerve tensioner x10 bilat Supine hamstring stretch with strap 2x20 sec bilat Supine IT band stretch with strap 2x20 sec bilat Supine lower trunk rotation x10 bilat Supine hip adduction ball squeeze 10x5 sec hold Supine TA contraction with hands pressing into small ball on thighs 10x5 sec Supine 90/90 heel tap 2x10 bilat Side-lying clamshells 2x10 bilat Supine piriformis stretch 2x20 sec bilat Prone hamstring curl x10 bilat  Manual:  Addaday to bilateral lumbar paraspinals, bilat glute/piriformis, bilat hamstrings, and bilat IT band.   DATE: 12/10/2022  Reviewed HEP (see below) and perform one set of each   PATIENT EDUCATION:  Education details: Issued HEP Person educated: Patient Education method: Explanation Education comprehension:  verbalized understanding and returned demonstration  HOME EXERCISE PROGRAM: Access Code: [email or web address] URL: [email or web address] Date: 12/10/2022 Prepared by: Clydie Braun Leslye Puccini  Exercises - Supine Lower Trunk Rotation  - 1-2 x daily - 7 x weekly - 1 sets - 10 reps - Supine Posterior Pelvic Tilt  - 1-2 x daily - 7 x weekly - 2 sets - 10 reps - Supine Piriformis Stretch with Foot on Ground  - 1-2 x daily - 7 x weekly - 1 sets - 2 reps - 20 sec hold - Supine Sciatic Nerve Glide  - 1 x daily - 7 x weekly - 1 sets - 10 reps - 1 sec hold - Supine Transversus Abdominis Bracing - Hands on Thighs  - 1-2 x daily - 7 x weekly - 2 sets - 10 reps - Standing 'L' Stretch at Counter  - 2 x daily - 7 x weekly - 1 sets - 2 reps - 20 sec hold - Seated Piriformis Stretch  - 1-2 x daily - 7 x weekly - 1 sets - 2 reps - 20 sec hold - Seated Sciatic Tensioner  - 1-2 x daily - 7 x weekly - 1-2 sets - 10 reps  ASSESSMENT:  CLINICAL IMPRESSION: Ms Goodlow presents to skilled PT reporting continued improvements.  Patient able to progress with increased strengthening during session.  Continues to progress with use of weight machines during session without increased pain reports.  Patient reports feeling better and feeling less sciatic nerve stretch during tensioner.  Patient continues to progress towards goal related activities and increased core mobility.  OBJECTIVE IMPAIRMENTS: difficulty walking, decreased strength, increased muscle spasms, improper body mechanics, postural dysfunction, and pain.   ACTIVITY LIMITATIONS: lifting and bending  PARTICIPATION LIMITATIONS: community activity  PERSONAL FACTORS: 1 comorbidity: HTN  are also affecting patient's functional outcome.   REHAB POTENTIAL: [place]  CLINICAL DECISION MAKING: Stable/uncomplicated  EVALUATION COMPLEXITY: Low   GOALS: Goals reviewed with patient? Yes  SHORT TERM GOALS: Target date: 12/27/2022  Pt will be independent with initial  HEP. Baseline: Goal status: IN PROGRESS  2.  Patient will report at least a 40% improvement in symptoms since starting PT. Baseline:  Goal status: IN PROGRESS   LONG TERM GOALS: Target date: 01/31/2023  Pt will be independent with advanced HEP. Baseline:  Goal status: INITIAL  2.  Patient will increase FOTO to at least 72% to demonstrate improvements in functional mobility. Baseline: 56% Goal status: INITIAL  3.  Patient will increase right hip and hamstring strength to at least 4+ to 5-/5 to improve gait pattern and ability to navigate stairs. Baseline:  Goal status: INITIAL  4.  Patient will report ability to perform desired community activities and ambulation without increased pain. Baseline:  Goal status: INITIAL   PLAN:  PT FREQUENCY: 1-2x/week  PT DURATION: 8 weeks  PLANNED INTERVENTIONS: Therapeutic exercises, Therapeutic activity, Neuromuscular re-education, Balance training, Gait training, Patient/Family education, Self Care, Joint mobilization, Joint manipulation, Stair training, Aquatic Therapy, Dry Needling, Electrical stimulation, Cryotherapy, Moist heat, Taping, Traction, Ultrasound, Ionotophoresis 4mg /ml Dexamethasone, Manual therapy, and Re-evaluation.  PLAN FOR NEXT SESSION: Assess and progress HEP as indicated, strengthening, core stability, flexibility   Arseniy Toomey, PT 12/24/2022, 12:30 PM   [facility] [address], [address] [place], [place] [address] Phone # [phone] Fax [phone]

## 2022-12-26 ENCOUNTER — Encounter: Payer: Self-pay | Admitting: Rehabilitative and Restorative Service Providers"

## 2022-12-26 ENCOUNTER — Ambulatory Visit: Payer: Medicare Other | Admitting: Rehabilitative and Restorative Service Providers"

## 2022-12-26 DIAGNOSIS — R252 Cramp and spasm: Secondary | ICD-10-CM | POA: Diagnosis not present

## 2022-12-26 DIAGNOSIS — M5441 Lumbago with sciatica, right side: Secondary | ICD-10-CM

## 2022-12-26 DIAGNOSIS — M6281 Muscle weakness (generalized): Secondary | ICD-10-CM

## 2022-12-26 NOTE — Therapy (Signed)
OUTPATIENT PHYSICAL THERAPY TREATMENT NOTE   Patient Name: Erica Sutton MRN: [id number] DOB:[birth year], 67 y.o., female Today's Date: 12/26/2022  END OF SESSION:  PT End of Session - 12/26/22 0932     Visit Number 4    Date for PT Re-Evaluation 01/31/23    Authorization Type [employer] Medicare    Progress Note Due on Visit 10    PT Start Time [phone]    PT Stop Time 1010    PT Time Calculation (min) 39 min    Activity Tolerance Patient tolerated treatment well    Behavior During Therapy WFL for tasks assessed/performed             Past Medical History:  Diagnosis Date   Anemia    intermittent her whole life, reports she had eval and was told to take MV   Eclampsia    GERD (gastroesophageal reflux disease) 08/03/2015   resolved 2016   Hyperglycemia 02/19/2013   Hyperlipidemia 02/19/2013   Hypertension    Obesity 08/03/2015   Past Surgical History:  Procedure Laterality Date   ABDOMINAL HYSTERECTOMY     heavy menses. no concerns for cancer.    CHOLECYSTECTOMY  11/10/12   lap chole with IOC   shoulder shoulder Bilateral    rotator cuff bilat; right shoulder also frozen.    Patient Active Problem List   Diagnosis Date Noted   Vitamin D deficiency 02/18/2022   Hyperlipidemia associated with type 2 diabetes mellitus (HCC) 10/02/2017   Type 2 diabetes mellitus without complication, without long-term current use of insulin (HCC) 10/02/2017   Morbid obesity (HCC) 08/03/2015   Essential hypertension 01/19/2007    PCP: Philip Aspen, Limmie Patricia, MD  REFERRING PROVIDER: Philip Aspen, Limmie Patricia, MD  REFERRING DIAG: [phone] (ICD-10-CM) - Acute right-sided low back pain with right-sided sciatica  Rationale for Evaluation and Treatment: Rehabilitation  THERAPY DIAG:  Low back pain with right-sided sciatica, unspecified back pain laterality, unspecified chronicity  Muscle weakness (generalized)  Cramp and spasm  ONSET DATE: early May when on vacation  SUBJECTIVE:                                                                                                                                                                                            SUBJECTIVE STATEMENT: Patient reports feeling 50% better since initial evaluation stating "I feel better."  PERTINENT HISTORY:  Anemia, HTN, bilateral shoulder rotator cuff repair  PAIN:  Are you having pain? Yes: NPRS scale: 4/10 Pain location: low back, radiating down right leg Pain description: stabbing Aggravating factors: movement Relieving factors: rest  PRECAUTIONS: None  WEIGHT BEARING RESTRICTIONS:  No  FALLS:  Has patient fallen in last 6 months? No  LIVING ENVIRONMENT: Lives with: lives with their family Lives in: House/apartment Stairs: Yes: External: 3 steps; can reach both Has following equipment at home: None  OCCUPATION: Recently Retired from working for [employer] in the [employer] department  PLOF: Independent and Leisure: travel, decorating  PATIENT GOALS: To feel better.  NEXT MD VISIT: As needed  OBJECTIVE:   DIAGNOSTIC FINDINGS:  Bone Density on 05/27/2022: Lumbar T score +0.4 Femoral Neck T score:  right- +0.1, left -0.3 Fracture Risk:  low  PATIENT SURVEYS:  Eval:  FOTO 56% (projected 72% by visit 10)  SCREENING FOR RED FLAGS: Bowel or bladder incontinence: No Spinal tumors: No Cauda equina syndrome: No Compression fracture: No Abdominal aneurysm: No  COGNITION: Overall cognitive status: Within functional limits for tasks assessed     SENSATION: WFL  MUSCLE LENGTH: Hamstrings: some tightness noted in right side  POSTURE: rounded shoulders and forward head  PALPATION: Denies tenderness to palpation, but does have some tightness/spasms in right side lumbar paraspinals and gluteal region  LUMBAR ROM:   12/10/2022:  WFL  LOWER EXTREMITY ROM:     12/10/2022:  WFL  LOWER EXTREMITY MMT:    12/10/2022:   Right hip strength is 4 to 4+/5 grossly throughout,  right hamstring is 4+/5 Left LE strength is WFL  LUMBAR SPECIAL TESTS:   12/10/2022: Slump test: Positive  FUNCTIONAL TESTS:   12/10/2022: 5 times sit to stand: 8.59 sec without UE use Timed up and go (TUG): 7.03 ec  GAIT: Distance walked: >500 ft Assistive device utilized: None Level of assistance: Complete Independence Comments: Pt did not report increased pain with shorter distance ambulation  TODAY'S TREATMENT:                                                                                                                               DATE: 12/26/2022  Nustep level 5 x6 min with PT present to discuss status Seated sciatic nerve tensioner 2x10 bilat Hip Matrix 45# for extension and abduction 2x10 each bilat Leg Press (seat at 7) 95# 3x10 (with cuing for slowing down velocity) Supine hamstring stretch with strap 2x20 sec bilat Supine IT band stretch with strap 2x20 sec bilat Supine piriformis stretch 2x20 sec bilat Supine lower trunk rotation with LE on red pball x10 bilat Supine 90/90 heel tap 2x10 bilat Side-lying clamshells with green loop 2x10 bilat Prone hamstring curl with 2#  2x10 bilat Prone hip extension with 2# x10 bilat Quadruped cat/cow and quadruped 'wag the tail' x10 each Standing "L" counter stretch 2x20 sec Standing rockerboard DF/PF x2 min   DATE: 12/24/2022  Nustep level 5 x6 min with PT present to discuss status Seated sciatic nerve tensioner x10 bilat Supine hamstring stretch with strap 2x20 sec bilat Supine IT band stretch with strap 2x20 sec bilat Supine piriformis stretch 2x20 sec bilat Supine lower trunk rotation with LE  on red pball x10 bilat Supine bridge with LE on red pball 2x10 Supine 90/90 heel tap 2x10 bilat Side-lying clamshells 2x10 bilat Hip Matrix 40# for extension and abduction x12 each bilat Leg Press (seat at 7) 90# 2x10 Standing rockerboard DF/PF x2 min    DATE: 12/17/2022  Nustep level 4 x6 min with PT present to discuss  status Seated sciatic nerve tensioner x10 bilat Supine hamstring stretch with strap 2x20 sec bilat Supine IT band stretch with strap 2x20 sec bilat Supine lower trunk rotation x10 bilat Supine hip adduction ball squeeze 10x5 sec hold Supine TA contraction with hands pressing into small ball on thighs 10x5 sec Supine 90/90 heel tap 2x10 bilat Side-lying clamshells 2x10 bilat Supine piriformis stretch 2x20 sec bilat Prone hamstring curl x10 bilat Manual:  Addaday to bilateral lumbar paraspinals, bilat glute/piriformis, bilat hamstrings, and bilat IT band.    PATIENT EDUCATION:  Education details: Issued HEP Person educated: Patient Education method: Explanation Education comprehension: verbalized understanding and returned demonstration  HOME EXERCISE PROGRAM: Access Code: [email or web address] URL: [email or web address] Date: 12/10/2022 Prepared by: Clydie Braun Breonia Kirstein  Exercises - Supine Lower Trunk Rotation  - 1-2 x daily - 7 x weekly - 1 sets - 10 reps - Supine Posterior Pelvic Tilt  - 1-2 x daily - 7 x weekly - 2 sets - 10 reps - Supine Piriformis Stretch with Foot on Ground  - 1-2 x daily - 7 x weekly - 1 sets - 2 reps - 20 sec hold - Supine Sciatic Nerve Glide  - 1 x daily - 7 x weekly - 1 sets - 10 reps - 1 sec hold - Supine Transversus Abdominis Bracing - Hands on Thighs  - 1-2 x daily - 7 x weekly - 2 sets - 10 reps - Standing 'L' Stretch at Counter  - 2 x daily - 7 x weekly - 1 sets - 2 reps - 20 sec hold - Seated Piriformis Stretch  - 1-2 x daily - 7 x weekly - 1 sets - 2 reps - 20 sec hold - Seated Sciatic Tensioner  - 1-2 x daily - 7 x weekly - 1-2 sets - 10 reps  ASSESSMENT:  CLINICAL IMPRESSION: Ms Bas presents to skilled PT reporting at least 50% improvement since initial evaluation.  Patient able to increase on reps/weights of weight equipment and able to add resistance loop to clamshell exercise.  Patient requires min cuing throughout for pacing and to slow down  to increase muscle recruitment during exercises.  Patient continues to progress with improved core stability and TA contraction during exercises.    OBJECTIVE IMPAIRMENTS: difficulty walking, decreased strength, increased muscle spasms, improper body mechanics, postural dysfunction, and pain.   ACTIVITY LIMITATIONS: lifting and bending  PARTICIPATION LIMITATIONS: community activity  PERSONAL FACTORS: 1 comorbidity: HTN  are also affecting patient's functional outcome.   REHAB POTENTIAL: [place]  CLINICAL DECISION MAKING: Stable/uncomplicated  EVALUATION COMPLEXITY: Low   GOALS: Goals reviewed with patient? Yes  SHORT TERM GOALS: Target date: 12/27/2022  Pt will be independent with initial HEP. Baseline: Goal status: MET  2.  Patient will report at least a 40% improvement in symptoms since starting PT. Baseline:  Goal status: MET   LONG TERM GOALS: Target date: 01/31/2023  Pt will be independent with advanced HEP. Baseline:  Goal status: IN PROGRESS  2.  Patient will increase FOTO to at least 72% to demonstrate improvements in functional mobility. Baseline: 56% Goal status: INITIAL  3.  Patient will increase right hip  and hamstring strength to at least 4+ to 5-/5 to improve gait pattern and ability to navigate stairs. Baseline:  Goal status: INITIAL  4.  Patient will report ability to perform desired community activities and ambulation without increased pain. Baseline:  Goal status: INITIAL   PLAN:  PT FREQUENCY: 1-2x/week  PT DURATION: 8 weeks  PLANNED INTERVENTIONS: Therapeutic exercises, Therapeutic activity, Neuromuscular re-education, Balance training, Gait training, Patient/Family education, Self Care, Joint mobilization, Joint manipulation, Stair training, Aquatic Therapy, Dry Needling, Electrical stimulation, Cryotherapy, Moist heat, Taping, Traction, Ultrasound, Ionotophoresis 4mg /ml Dexamethasone, Manual therapy, and Re-evaluation.  PLAN FOR NEXT SESSION:  Assess and progress HEP as indicated, strengthening, core stability, flexibility   Reather Laurence, PT 12/26/2022, 10:24 AM   [facility] [address] [place], [place] [address] Phone # [phone] Fax [phone]

## 2023-01-02 ENCOUNTER — Other Ambulatory Visit: Payer: Self-pay | Admitting: Internal Medicine

## 2023-01-02 DIAGNOSIS — M5441 Lumbago with sciatica, right side: Secondary | ICD-10-CM

## 2023-01-07 ENCOUNTER — Ambulatory Visit: Payer: Medicare Other | Admitting: Physical Therapy

## 2023-01-07 DIAGNOSIS — M6281 Muscle weakness (generalized): Secondary | ICD-10-CM

## 2023-01-07 DIAGNOSIS — M5441 Lumbago with sciatica, right side: Secondary | ICD-10-CM | POA: Diagnosis not present

## 2023-01-07 DIAGNOSIS — R252 Cramp and spasm: Secondary | ICD-10-CM | POA: Diagnosis not present

## 2023-01-07 NOTE — Therapy (Signed)
OUTPATIENT PHYSICAL THERAPY TREATMENT NOTE   Patient Name: Erica Sutton MRN: [id number] DOB:09/27/1955, 67 y.o., female Today's Date: 01/07/2023  END OF SESSION:  PT End of Session - 01/07/23 1100     Visit Number 5    Date for PT Re-Evaluation 01/31/23    Authorization Type [employer] Medicare    Progress Note Due on Visit 10    PT Start Time 1100    PT Stop Time 1140    PT Time Calculation (min) 40 min    Activity Tolerance Patient tolerated treatment well             Past Medical History:  Diagnosis Date   Anemia    intermittent her whole life, reports she had eval and was told to take MV   Eclampsia    GERD (gastroesophageal reflux disease) 08/03/2015   resolved 2016   Hyperglycemia 02/19/2013   Hyperlipidemia 02/19/2013   Hypertension    Obesity 08/03/2015   Past Surgical History:  Procedure Laterality Date   ABDOMINAL HYSTERECTOMY     heavy menses. no concerns for cancer.    CHOLECYSTECTOMY  11/10/12   lap chole with IOC   shoulder shoulder Bilateral    rotator cuff bilat; right shoulder also frozen.    Patient Active Problem List   Diagnosis Date Noted   Vitamin D deficiency 02/18/2022   Hyperlipidemia associated with type 2 diabetes mellitus (HCC) 10/02/2017   Type 2 diabetes mellitus without complication, without long-term current use of insulin (HCC) 10/02/2017   Morbid obesity (HCC) 08/03/2015   Essential hypertension 01/19/2007    PCP: Philip Aspen, Limmie Patricia, MD  REFERRING PROVIDER: Philip Aspen, Limmie Patricia, MD  REFERRING DIAG: [phone] (ICD-10-CM) - Acute right-sided low back pain with right-sided sciatica  Rationale for Evaluation and Treatment: Rehabilitation  THERAPY DIAG:  Low back pain with right-sided sciatica, unspecified back pain laterality, unspecified chronicity  Muscle weakness (generalized)  Cramp and spasm  ONSET DATE: early May when on vacation  SUBJECTIVE:                                                                                                                                                                                            SUBJECTIVE STATEMENT: I'm keeping up with ex's and I've already stretched this morning.  Reports a Christofferson understanding of current HEP.  I especially like the HS stretch with cross over.  Last had radiating pain down right leg on Sunday for a short time 10-15 minutes.  I do 20-30 min on TM at 3.0 mph on an incline.  I do dead lifting 70# at the gym.  Stopped running b/c it hurt my back.  I do the leg press at the gym.    PERTINENT HISTORY:  Anemia, HTN, bilateral shoulder rotator cuff repair  PAIN:  Are you having pain? Yes: NPRS scale: 0/10 Pain location: low back, radiating down right leg Pain description: stabbing Aggravating factors: movement Relieving factors: rest  PRECAUTIONS: None  WEIGHT BEARING RESTRICTIONS: No  FALLS:  Has patient fallen in last 6 months? No  LIVING ENVIRONMENT: Lives with: lives with their family Lives in: House/apartment Stairs: Yes: External: 3 steps; can reach both Has following equipment at home: None  OCCUPATION: Recently Retired from working for [employer] in the [employer] department  PLOF: Independent and Leisure: travel, decorating  PATIENT GOALS: To feel better.  NEXT MD VISIT: As needed  OBJECTIVE:   DIAGNOSTIC FINDINGS:  Bone Density on 05/27/2022: Lumbar T score +0.4 Femoral Neck T score:  right- +0.1, left -0.3 Fracture Risk:  low  PATIENT SURVEYS:  Eval:  FOTO 56% (projected 72% by visit 10)  SCREENING FOR RED FLAGS: Bowel or bladder incontinence: No Spinal tumors: No Cauda equina syndrome: No Compression fracture: No Abdominal aneurysm: No  COGNITION: Overall cognitive status: Within functional limits for tasks assessed     SENSATION: WFL  MUSCLE LENGTH: Hamstrings: some tightness noted in right side  POSTURE: rounded shoulders and forward head  PALPATION: Denies tenderness to palpation, but does  have some tightness/spasms in right side lumbar paraspinals and gluteal region  LUMBAR ROM:   12/10/2022:  WFL  LOWER EXTREMITY ROM:     12/10/2022:  WFL  LOWER EXTREMITY MMT:    12/10/2022:   Right hip strength is 4 to 4+/5 grossly throughout, right hamstring is 4+/5 Left LE strength is WFL  LUMBAR SPECIAL TESTS:   12/10/2022: Slump test: Positive  FUNCTIONAL TESTS:   12/10/2022: 5 times sit to stand: 8.59 sec without UE use Timed up and go (TUG): 7.03 ec  GAIT: Distance walked: >500 ft Assistive device utilized: None Level of assistance: Complete Independence Comments: Pt did not report increased pain with shorter distance ambulation  TODAY'S TREATMENT:         DATE: 01/07/2023  Nustep level 5 x6 min with PT present to discuss status Hip Matrix 45# for extension and abduction 2x10 each bilat Leg Press (seat at 7) 95# 3x10 (with cuing for slowing down velocity) Supine 90/90 heel tap 2x10 bilat Side-lying clamshells with yellow loop x10 bilat Bird dogs UE 5x, Les 5x; opp UE/LE unable to stabilize so added red physioball 10x alternating arms/legs Box squats 16 inch 10# kettlebell goblet style 20x Staggered stance deadlifts 10# kettlebell 10x right/left Matrix low row 25# 20x 30# barbell from orange mini parallel bars with overhead press (pt demos what she does at the gym) 7x                                                                                                                DATE: 12/26/2022  Nustep level 5 x6 min with PT present  to discuss status Seated sciatic nerve tensioner 2x10 bilat Hip Matrix 45# for extension and abduction 2x10 each bilat Leg Press (seat at 7) 95# 3x10 (with cuing for slowing down velocity) Supine hamstring stretch with strap 2x20 sec bilat Supine IT band stretch with strap 2x20 sec bilat Supine piriformis stretch 2x20 sec bilat Supine lower trunk rotation with LE on red pball x10 bilat Supine 90/90 heel tap 2x10 bilat Side-lying  clamshells with green loop 2x10 bilat Prone hamstring curl with 2#  2x10 bilat Prone hip extension with 2# x10 bilat Quadruped cat/cow and quadruped 'wag the tail' x10 each Standing "L" counter stretch 2x20 sec Standing rockerboard DF/PF x2 min   DATE: 12/24/2022  Nustep level 5 x6 min with PT present to discuss status Seated sciatic nerve tensioner x10 bilat Supine hamstring stretch with strap 2x20 sec bilat Supine IT band stretch with strap 2x20 sec bilat Supine piriformis stretch 2x20 sec bilat Supine lower trunk rotation with LE on red pball x10 bilat Supine bridge with LE on red pball 2x10 Supine 90/90 heel tap 2x10 bilat Side-lying clamshells 2x10 bilat Hip Matrix 40# for extension and abduction x12 each bilat Leg Press (seat at 7) 90# 2x10 Standing rockerboard DF/PF x2 min     PATIENT EDUCATION:  Education details: Issued HEP Person educated: Patient Education method: Explanation Education comprehension: verbalized understanding and returned demonstration  HOME EXERCISE PROGRAM: Access Code: [email or web address] URL: [email or web address] Date: 12/10/2022 Prepared by: Clydie Braun Menke  Exercises - Supine Lower Trunk Rotation  - 1-2 x daily - 7 x weekly - 1 sets - 10 reps - Supine Posterior Pelvic Tilt  - 1-2 x daily - 7 x weekly - 2 sets - 10 reps - Supine Piriformis Stretch with Foot on Ground  - 1-2 x daily - 7 x weekly - 1 sets - 2 reps - 20 sec hold - Supine Sciatic Nerve Glide  - 1 x daily - 7 x weekly - 1 sets - 10 reps - 1 sec hold - Supine Transversus Abdominis Bracing - Hands on Thighs  - 1-2 x daily - 7 x weekly - 2 sets - 10 reps - Standing 'L' Stretch at Counter  - 2 x daily - 7 x weekly - 1 sets - 2 reps - 20 sec hold - Seated Piriformis Stretch  - 1-2 x daily - 7 x weekly - 1 sets - 2 reps - 20 sec hold - Seated Sciatic Tensioner  - 1-2 x daily - 7 x weekly - 1-2 sets - 10 reps  ASSESSMENT:  CLINICAL IMPRESSION: Patient demonstrates excellent  compliance with HEP and some modified gym exercise.  Added additional trunk extensor strengthening including seated rows, dead lifts and bird dogs.  She has difficulty stabilizing with bird dogs with compensatory trunk and pelvic rotation.  Verbal and tactile cues provided and added physioball for stability.  No pain prior to, during or following treatment session.     OBJECTIVE IMPAIRMENTS: difficulty walking, decreased strength, increased muscle spasms, improper body mechanics, postural dysfunction, and pain.   ACTIVITY LIMITATIONS: lifting and bending  PARTICIPATION LIMITATIONS: community activity  PERSONAL FACTORS: 1 comorbidity: HTN  are also affecting patient's functional outcome.   REHAB POTENTIAL: [facility]  CLINICAL DECISION MAKING: Stable/uncomplicated  EVALUATION COMPLEXITY: Low   GOALS: Goals reviewed with patient? Yes  SHORT TERM GOALS: Target date: 12/27/2022  Pt will be independent with initial HEP. Baseline: Goal status: MET  2.  Patient will report at least a 40% improvement in symptoms since  starting PT. Baseline:  Goal status: MET   LONG TERM GOALS: Target date: 01/31/2023  Pt will be independent with advanced HEP. Baseline:  Goal status: IN PROGRESS  2.  Patient will increase FOTO to at least 72% to demonstrate improvements in functional mobility. Baseline: 56% Goal status: INITIAL  3.  Patient will increase right hip and hamstring strength to at least 4+ to 5-/5 to improve gait pattern and ability to navigate stairs. Baseline:  Goal status: INITIAL  4.  Patient will report ability to perform desired community activities and ambulation without increased pain. Baseline:  Goal status: INITIAL   PLAN:  PT FREQUENCY: 1-2x/week  PT DURATION: 8 weeks  PLANNED INTERVENTIONS: Therapeutic exercises, Therapeutic activity, Neuromuscular re-education, Balance training, Gait training, Patient/Family education, Self Care, Joint mobilization, Joint manipulation,  Stair training, Aquatic Therapy, Dry Needling, Electrical stimulation, Cryotherapy, Moist heat, Taping, Traction, Ultrasound, Ionotophoresis 4mg /ml Dexamethasone, Manual therapy, and Re-evaluation.  PLAN FOR NEXT SESSION: Assess and progress HEP as indicated, strengthening, core stability, flexibility   Lavinia Sharps, PT 01/07/23 1:04 PM Phone: [phone] Fax: [phone]  [facility] [address], [address] [place], [place] [address] Phone # [phone] Fax [phone]

## 2023-01-09 ENCOUNTER — Ambulatory Visit: Payer: Medicare Other | Admitting: Physical Therapy

## 2023-01-09 DIAGNOSIS — R252 Cramp and spasm: Secondary | ICD-10-CM | POA: Diagnosis not present

## 2023-01-09 DIAGNOSIS — M6281 Muscle weakness (generalized): Secondary | ICD-10-CM

## 2023-01-09 DIAGNOSIS — M5441 Lumbago with sciatica, right side: Secondary | ICD-10-CM

## 2023-01-09 NOTE — Therapy (Signed)
OUTPATIENT PHYSICAL THERAPY TREATMENT NOTE   Patient Name: Erica Sutton MRN: [id number] DOB:[birth year], 67 y.o., female Today's Date: 01/09/2023  END OF SESSION:  PT End of Session - 01/09/23 1057     Visit Number 6    Date for PT Re-Evaluation 01/31/23    Authorization Type [employer] Medicare    Progress Note Due on Visit 10    PT Start Time 1100    PT Stop Time 1139    PT Time Calculation (min) 39 min    Activity Tolerance Patient tolerated treatment well             Past Medical History:  Diagnosis Date   Anemia    intermittent her whole life, reports she had eval and was told to take MV   Eclampsia    GERD (gastroesophageal reflux disease) 08/03/2015   resolved 2016   Hyperglycemia 02/19/2013   Hyperlipidemia 02/19/2013   Hypertension    Obesity 08/03/2015   Past Surgical History:  Procedure Laterality Date   ABDOMINAL HYSTERECTOMY     heavy menses. no concerns for cancer.    CHOLECYSTECTOMY  11/10/12   lap chole with IOC   shoulder shoulder Bilateral    rotator cuff bilat; right shoulder also frozen.    Patient Active Problem List   Diagnosis Date Noted   Vitamin D deficiency 02/18/2022   Hyperlipidemia associated with type 2 diabetes mellitus (HCC) 10/02/2017   Type 2 diabetes mellitus without complication, without long-term current use of insulin (HCC) 10/02/2017   Morbid obesity (HCC) 08/03/2015   Essential hypertension 01/19/2007    PCP: Philip Aspen, Limmie Patricia, MD  REFERRING PROVIDER: Philip Aspen, Limmie Patricia, MD  REFERRING DIAG: [phone] (ICD-10-CM) - Acute right-sided low back pain with right-sided sciatica  Rationale for Evaluation and Treatment: Rehabilitation  THERAPY DIAG:  Low back pain with right-sided sciatica, unspecified back pain laterality, unspecified chronicity  Muscle weakness (generalized)  ONSET DATE: early May when on vacation  SUBJECTIVE:                                                                                                                                                                                            SUBJECTIVE STATEMENT:  Family coming to visit this weekend.  I did Sek after last time.  It bothered me a little yesterday just while sitting 5-10 min.  Did the leg press at the gym this morning.   PERTINENT HISTORY:  Anemia, HTN, bilateral shoulder rotator cuff repair  PAIN:  Are you having pain? Yes: NPRS scale: 0/10 Pain location: low back, radiating down right leg Pain description: stabbing Aggravating factors:  movement Relieving factors: rest  PRECAUTIONS: None  WEIGHT BEARING RESTRICTIONS: No  FALLS:  Has patient fallen in last 6 months? No  LIVING ENVIRONMENT: Lives with: lives with their family Lives in: House/apartment Stairs: Yes: External: 3 steps; can reach both Has following equipment at home: None  OCCUPATION: Recently Retired from working for [employer] in the [employer] department  PLOF: Independent and Leisure: travel, decorating  PATIENT GOALS: To feel better.  NEXT MD VISIT: As needed  OBJECTIVE:   DIAGNOSTIC FINDINGS:  Bone Density on 05/27/2022: Lumbar T score +0.4 Femoral Neck T score:  right- +0.1, left -0.3 Fracture Risk:  low  PATIENT SURVEYS:  Eval:  FOTO 56% (projected 72% by visit 10)  SCREENING FOR RED FLAGS: Bowel or bladder incontinence: No Spinal tumors: No Cauda equina syndrome: No Compression fracture: No Abdominal aneurysm: No  COGNITION: Overall cognitive status: Within functional limits for tasks assessed     SENSATION: WFL  MUSCLE LENGTH: Hamstrings: some tightness noted in right side  POSTURE: rounded shoulders and forward head  PALPATION: Denies tenderness to palpation, but does have some tightness/spasms in right side lumbar paraspinals and gluteal region  LUMBAR ROM:   12/10/2022:  WFL  LOWER EXTREMITY ROM:     12/10/2022:  WFL  LOWER EXTREMITY MMT:    12/10/2022:   Right hip strength is 4 to 4+/5 grossly  throughout, right hamstring is 4+/5 Left LE strength is [facility]  LUMBAR SPECIAL TESTS:   12/10/2022: Slump test: Positive  FUNCTIONAL TESTS:   12/10/2022: 5 times sit to stand: 8.59 sec without UE use Timed up and go (TUG): 7.03 ec  GAIT: Distance walked: >500 ft Assistive device utilized: None Level of assistance: Complete Independence Comments: Pt did not report increased pain with shorter distance ambulation  TODAY'S TREATMENT:         DATE: 01/09/2023  Nustep level 5 x6 min with PT present to discuss status Discussed sitting positioning and postural alignment Pallof series staggered stance red band: 5x press out, press up, press out and up, stir the pot each way Prone over the ball: UE raises 5x, LE raise 5x; opp UE/LE 5x2 not alternating Pallof style arms out straight red band: marching and retro toe touch 5x each from each side Leaning over tall hi-lo table at crease of hips: single leg lift right/left and double leg lift 5x each Seated green ball 3 way back stretch (pt got ball for home use) Sitting on green ball pelvic rocks and circles Box squats 16 inch 10# kettlebell goblet style 20x Matrix low row 25# 20x Discussion of strategies for the car/sitting  DATE: 01/07/2023  Nustep level 5 x6 min with PT present to discuss status Hip Matrix 45# for extension and abduction 2x10 each bilat Leg Press (seat at 7) 95# 3x10 (with cuing for slowing down velocity) Supine 90/90 heel tap 2x10 bilat Side-lying clamshells with yellow loop x10 bilat Bird dogs UE 5x, Les 5x; opp UE/LE unable to stabilize so added red physioball 10x alternating arms/legs Box squats 16 inch 10# kettlebell goblet style 20x Staggered stance deadlifts 10# kettlebell 10x right/left Matrix low row 25# 20x 30# barbell from orange mini parallel bars with overhead press (pt demos what she does at the gym) 7x  DATE: 12/26/2022  Nustep level 5 x6 min with PT present to discuss status Seated sciatic nerve tensioner 2x10 bilat Hip Matrix 45# for extension and abduction 2x10 each bilat Leg Press (seat at 7) 95# 3x10 (with cuing for slowing down velocity) Supine hamstring stretch with strap 2x20 sec bilat Supine IT band stretch with strap 2x20 sec bilat Supine piriformis stretch 2x20 sec bilat Supine lower trunk rotation with LE on red pball x10 bilat Supine 90/90 heel tap 2x10 bilat Side-lying clamshells with green loop 2x10 bilat Prone hamstring curl with 2#  2x10 bilat Prone hip extension with 2# x10 bilat Quadruped cat/cow and quadruped 'wag the tail' x10 each Standing "L" counter stretch 2x20 sec Standing rockerboard DF/PF x2 min   DATE: 12/24/2022  Nustep level 5 x6 min with PT present to discuss status Seated sciatic nerve tensioner x10 bilat Supine hamstring stretch with strap 2x20 sec bilat Supine IT band stretch with strap 2x20 sec bilat Supine piriformis stretch 2x20 sec bilat Supine lower trunk rotation with LE on red pball x10 bilat Supine bridge with LE on red pball 2x10 Supine 90/90 heel tap 2x10 bilat Side-lying clamshells 2x10 bilat Hip Matrix 40# for extension and abduction x12 each bilat Leg Press (seat at 7) 90# 2x10 Standing rockerboard DF/PF x2 min     PATIENT EDUCATION:  Education details: Issued HEP Person educated: Patient Education method: Explanation Education comprehension: verbalized understanding and returned demonstration  HOME EXERCISE PROGRAM: Access Code: [email or web address] URL: [email or web address] Date: 12/10/2022 Prepared by: Clydie Braun Menke  Exercises - Supine Lower Trunk Rotation  - 1-2 x daily - 7 x weekly - 1 sets - 10 reps - Supine Posterior Pelvic Tilt  - 1-2 x daily - 7 x weekly - 2 sets - 10 reps - Supine Piriformis Stretch with Foot on Ground  - 1-2 x daily - 7 x weekly - 1 sets - 2 reps - 20 sec hold - Supine Sciatic Nerve  Glide  - 1 x daily - 7 x weekly - 1 sets - 10 reps - 1 sec hold - Supine Transversus Abdominis Bracing - Hands on Thighs  - 1-2 x daily - 7 x weekly - 2 sets - 10 reps - Standing 'L' Stretch at Counter  - 2 x daily - 7 x weekly - 1 sets - 2 reps - 20 sec hold - Seated Piriformis Stretch  - 1-2 x daily - 7 x weekly - 1 sets - 2 reps - 20 sec hold - Seated Sciatic Tensioner  - 1-2 x daily - 7 x weekly - 1-2 sets - 10 reps  ASSESSMENT:  CLINICAL IMPRESSION: Patient continues to have decreasing pain occurrence and intensity. Pain present mainly with sitting and not present during activity.  Able to progress challenge level of core ex's with a Grosz response.  Verbal cues to activate targeted muscle groups.    On track to meet LTGs.   OBJECTIVE IMPAIRMENTS: difficulty walking, decreased strength, increased muscle spasms, improper body mechanics, postural dysfunction, and pain.   ACTIVITY LIMITATIONS: lifting and bending  PARTICIPATION LIMITATIONS: community activity  PERSONAL FACTORS: 1 comorbidity: HTN  are also affecting patient's functional outcome.   REHAB POTENTIAL: [place]  CLINICAL DECISION MAKING: Stable/uncomplicated  EVALUATION COMPLEXITY: Low   GOALS: Goals reviewed with patient? Yes  SHORT TERM GOALS: Target date: 12/27/2022  Pt will be independent with initial HEP. Baseline: Goal status: MET  2.  Patient will report at least a 40% improvement in symptoms since starting PT. Baseline:  Goal status: MET   LONG TERM GOALS: Target date: 01/31/2023  Pt will be independent with advanced HEP. Baseline:  Goal status: IN PROGRESS  2.  Patient will increase FOTO to at least 72% to demonstrate improvements in functional mobility. Baseline: 56% Goal status: INITIAL  3.  Patient will increase right hip and hamstring strength to at least 4+ to 5-/5 to improve gait pattern and ability to navigate stairs. Baseline:  Goal status: INITIAL  4.  Patient will report ability to  perform desired community activities and ambulation without increased pain. Baseline:  Goal status: INITIAL   PLAN:  PT FREQUENCY: 1-2x/week  PT DURATION: 8 weeks  PLANNED INTERVENTIONS: Therapeutic exercises, Therapeutic activity, Neuromuscular re-education, Balance training, Gait training, Patient/Family education, Self Care, Joint mobilization, Joint manipulation, Stair training, Aquatic Therapy, Dry Needling, Electrical stimulation, Cryotherapy, Moist heat, Taping, Traction, Ultrasound, Ionotophoresis 4mg /ml Dexamethasone, Manual therapy, and Re-evaluation.  PLAN FOR NEXT SESSION: Assess and progress HEP as indicated, strengthening, core stability, flexibility   Lavinia Sharps, PT 01/09/23 1:54 PM Phone: [phone] Fax: [phone]   [facility] [address] [place], [place] [address] Phone # [phone] Fax [phone]

## 2023-01-14 ENCOUNTER — Encounter: Payer: Self-pay | Admitting: Rehabilitative and Restorative Service Providers"

## 2023-01-14 ENCOUNTER — Ambulatory Visit: Payer: Medicare Other | Admitting: Rehabilitative and Restorative Service Providers"

## 2023-01-14 DIAGNOSIS — M5441 Lumbago with sciatica, right side: Secondary | ICD-10-CM

## 2023-01-14 DIAGNOSIS — R252 Cramp and spasm: Secondary | ICD-10-CM

## 2023-01-14 DIAGNOSIS — M6281 Muscle weakness (generalized): Secondary | ICD-10-CM | POA: Diagnosis not present

## 2023-01-14 NOTE — Therapy (Signed)
OUTPATIENT PHYSICAL THERAPY TREATMENT NOTE   Patient Name: Erica Sutton MRN: [id number] DOB:Oct 03, 1955, 67 y.o., female Today's Date: 01/14/2023  END OF SESSION:  PT End of Session - 01/14/23 1112     Visit Number 7    Date for PT Re-Evaluation 01/31/23    Authorization Type [employer] Medicare    Progress Note Due on Visit 10    PT Start Time 1100    PT Stop Time 1140    PT Time Calculation (min) 40 min    Activity Tolerance Patient tolerated treatment well    Behavior During Therapy WFL for tasks assessed/performed             Past Medical History:  Diagnosis Date   Anemia    intermittent her whole life, reports she had eval and was told to take MV   Eclampsia    GERD (gastroesophageal reflux disease) 08/03/2015   resolved 2016   Hyperglycemia 02/19/2013   Hyperlipidemia 02/19/2013   Hypertension    Obesity 08/03/2015   Past Surgical History:  Procedure Laterality Date   ABDOMINAL HYSTERECTOMY     heavy menses. no concerns for cancer.    CHOLECYSTECTOMY  11/10/12   lap chole with IOC   shoulder shoulder Bilateral    rotator cuff bilat; right shoulder also frozen.    Patient Active Problem List   Diagnosis Date Noted   Vitamin D deficiency 02/18/2022   Hyperlipidemia associated with type 2 diabetes mellitus (HCC) 10/02/2017   Type 2 diabetes mellitus without complication, without long-term current use of insulin (HCC) 10/02/2017   Morbid obesity (HCC) 08/03/2015   Essential hypertension 01/19/2007    PCP: Philip Aspen, Limmie Patricia, MD  REFERRING PROVIDER: Philip Aspen, Limmie Patricia, MD  REFERRING DIAG: [phone] (ICD-10-CM) - Acute right-sided low back pain with right-sided sciatica  Rationale for Evaluation and Treatment: Rehabilitation  THERAPY DIAG:  Low back pain with right-sided sciatica, unspecified back pain laterality, unspecified chronicity  Muscle weakness (generalized)  Cramp and spasm  ONSET DATE: early May when on vacation  SUBJECTIVE:                                                                                                                                                                                            SUBJECTIVE STATEMENT: Pt reports visit with family went well, but she is going to have a more relaxed week this week to recuperate.  States that she is feeling 70-75% better since starting PT, but still having most difficulty with sitting.  PERTINENT HISTORY:  Anemia, HTN, bilateral shoulder rotator cuff repair  PAIN:  Are you having pain? Yes:  NPRS scale: 4/10 Pain location: low back, radiating down right leg Pain description: stabbing Aggravating factors: movement Relieving factors: rest  PRECAUTIONS: None  WEIGHT BEARING RESTRICTIONS: No  FALLS:  Has patient fallen in last 6 months? No  LIVING ENVIRONMENT: Lives with: lives with their family Lives in: House/apartment Stairs: Yes: External: 3 steps; can reach both Has following equipment at home: None  OCCUPATION: Recently Retired from working for [employer] in the [employer] department  PLOF: Independent and Leisure: travel, decorating  PATIENT GOALS: To feel better.  NEXT MD VISIT: As needed  OBJECTIVE:   DIAGNOSTIC FINDINGS:  Bone Density on 05/27/2022: Lumbar T score +0.4 Femoral Neck T score:  right- +0.1, left -0.3 Fracture Risk:  low  PATIENT SURVEYS:  Eval:  FOTO 56% (projected 72% by visit 10)  SCREENING FOR RED FLAGS: Bowel or bladder incontinence: No Spinal tumors: No Cauda equina syndrome: No Compression fracture: No Abdominal aneurysm: No  COGNITION: Overall cognitive status: Within functional limits for tasks assessed     SENSATION: WFL  MUSCLE LENGTH: Hamstrings: some tightness noted in right side  POSTURE: rounded shoulders and forward head  PALPATION: Denies tenderness to palpation, but does have some tightness/spasms in right side lumbar paraspinals and gluteal region  LUMBAR ROM:   12/10/2022:   WFL  LOWER EXTREMITY ROM:     12/10/2022:  WFL  LOWER EXTREMITY MMT:    12/10/2022:   Right hip strength is 4 to 4+/5 grossly throughout, right hamstring is 4+/5 Left LE strength is WFL  LUMBAR SPECIAL TESTS:   12/10/2022: Slump test: Positive  FUNCTIONAL TESTS:   12/10/2022: 5 times sit to stand: 8.59 sec without UE use Timed up and go (TUG): 7.03 ec  GAIT: Distance walked: >500 ft Assistive device utilized: None Level of assistance: Complete Independence Comments: Pt did not report increased pain with shorter distance ambulation  TODAY'S TREATMENT:          DATE: 01/14/2023  Nustep level 5 x6 min with PT present to discuss status Seated piriformis stretch 2x20 sec Seated hamstring stretch 2x20 sec Leaning prone over red pball performing alt UE/LE 2x10 Deep squats to 16 inch box holding 10# dumbbell 3x10 Backwards lunge with foot on slider 2x10 bilat FWD lunges with cuing to "push off the glutes" 2x10 bilat 9 inch controlled step downs 3x10 bilat   DATE: 01/09/2023  Nustep level 5 x6 min with PT present to discuss status Discussed sitting positioning and postural alignment Pallof series staggered stance red band: 5x press out, press up, press out and up, stir the pot each way Prone over the ball: UE raises 5x, LE raise 5x; opp UE/LE 5x2 not alternating Pallof style arms out straight red band: marching and retro toe touch 5x each from each side Leaning over tall hi-lo table at crease of hips: single leg lift right/left and double leg lift 5x each Seated green ball 3 way back stretch (pt got ball for home use) Sitting on green ball pelvic rocks and circles Box squats 16 inch 10# kettlebell goblet style 20x Matrix low row 25# 20x Discussion of strategies for the car/sitting  DATE: 01/07/2023  Nustep level 5 x6 min with PT present to discuss status Hip Matrix 45# for extension and abduction 2x10 each bilat Leg Press (seat at 7) 95# 3x10 (with cuing for slowing down  velocity) Supine 90/90 heel tap 2x10 bilat Side-lying clamshells with yellow loop x10 bilat Bird dogs UE 5x, Les 5x; opp UE/LE unable to stabilize  so added red physioball 10x alternating arms/legs Box squats 16 inch 10# kettlebell goblet style 20x Staggered stance deadlifts 10# kettlebell 10x right/left Matrix low row 25# 20x 30# barbell from orange mini parallel bars with overhead press (pt demos what she does at the gym) 7x                                                                                                                    PATIENT EDUCATION:  Education details: Issued HEP Person educated: Patient Education method: Explanation Education comprehension: verbalized understanding and returned demonstration  HOME EXERCISE PROGRAM: Access Code: [email or web address] URL: [email or web address] Date: 12/10/2022 Prepared by: Clydie Braun Natassja Ollis  Exercises - Supine Lower Trunk Rotation  - 1-2 x daily - 7 x weekly - 1 sets - 10 reps - Supine Posterior Pelvic Tilt  - 1-2 x daily - 7 x weekly - 2 sets - 10 reps - Supine Piriformis Stretch with Foot on Ground  - 1-2 x daily - 7 x weekly - 1 sets - 2 reps - 20 sec hold - Supine Sciatic Nerve Glide  - 1 x daily - 7 x weekly - 1 sets - 10 reps - 1 sec hold - Supine Transversus Abdominis Bracing - Hands on Thighs  - 1-2 x daily - 7 x weekly - 2 sets - 10 reps - Standing 'L' Stretch at Counter  - 2 x daily - 7 x weekly - 1 sets - 2 reps - 20 sec hold - Seated Piriformis Stretch  - 1-2 x daily - 7 x weekly - 1 sets - 2 reps - 20 sec hold - Seated Sciatic Tensioner  - 1-2 x daily - 7 x weekly - 1-2 sets - 10 reps  ASSESSMENT:  CLINICAL IMPRESSION: Patient required tactile and verbal cuing to correct lateral trunk leans during step ups/down on 9in step and on bosu ball when "toe out" compensatory mechanism was demonstrated in addition to reducing pace during step downs and in the lowering phase of BW squats. Patient demonstrated  significant knee valgus during single leg activities indicating general hip abductor weakness. However, Ms.Salemi reported loving the intensity of the new activities introduced. Specifically, the wall squats with PPT.   OBJECTIVE IMPAIRMENTS: difficulty walking, decreased strength, increased muscle spasms, improper body mechanics, postural dysfunction, and pain.   ACTIVITY LIMITATIONS: lifting and bending  PARTICIPATION LIMITATIONS: community activity  PERSONAL FACTORS: 1 comorbidity: HTN  are also affecting patient's functional outcome.   REHAB POTENTIAL: [place]  CLINICAL DECISION MAKING: Stable/uncomplicated  EVALUATION COMPLEXITY: Low   GOALS: Goals reviewed with patient? Yes  SHORT TERM GOALS: Target date: 12/27/2022  Pt will be independent with initial HEP. Baseline: Goal status: MET  2.  Patient will report at least a 40% improvement in symptoms since starting PT. Baseline:  Goal status: MET   LONG TERM GOALS: Target date: 01/31/2023  Pt will be independent with advanced HEP. Baseline:  Goal status: IN PROGRESS  2.  Patient will increase FOTO to at least 72%  to demonstrate improvements in functional mobility. Baseline: 56% Goal status: IN PROGRESS  3.  Patient will increase right hip and hamstring strength to at least 4+ to 5-/5 to improve gait pattern and ability to navigate stairs. Baseline:  Goal status: IN PROGRESS  4.  Patient will report ability to perform desired community activities and ambulation without increased pain. Baseline:  Goal status: INITIAL   PLAN:  PT FREQUENCY: 1-2x/week  PT DURATION: 8 weeks  PLANNED INTERVENTIONS: Therapeutic exercises, Therapeutic activity, Neuromuscular re-education, Balance training, Gait training, Patient/Family education, Self Care, Joint mobilization, Joint manipulation, Stair training, Aquatic Therapy, Dry Needling, Electrical stimulation, Cryotherapy, Moist heat, Taping, Traction, Ultrasound, Ionotophoresis  4mg /ml Dexamethasone, Manual therapy, and Re-evaluation.  PLAN FOR NEXT SESSION: Assess and progress HEP as indicated, strengthening, core stability, flexibility   Reather Laurence, PT 01/14/23 11:52 AM   [facility] [address] [place], [place] [address] Phone # [phone] Fax [phone]

## 2023-01-16 ENCOUNTER — Ambulatory Visit: Payer: Medicare Other | Admitting: Rehabilitative and Restorative Service Providers"

## 2023-01-16 ENCOUNTER — Encounter: Payer: Self-pay | Admitting: Rehabilitative and Restorative Service Providers"

## 2023-01-16 DIAGNOSIS — R252 Cramp and spasm: Secondary | ICD-10-CM | POA: Diagnosis not present

## 2023-01-16 DIAGNOSIS — M5441 Lumbago with sciatica, right side: Secondary | ICD-10-CM

## 2023-01-16 DIAGNOSIS — M6281 Muscle weakness (generalized): Secondary | ICD-10-CM

## 2023-01-16 NOTE — Therapy (Signed)
OUTPATIENT PHYSICAL THERAPY TREATMENT NOTE   Patient Name: Erica Sutton MRN: [id number] DOB:1955/08/03, 67 y.o., female Today's Date: 01/16/2023  END OF SESSION:  PT End of Session - 01/16/23 1022     Visit Number 8    Date for PT Re-Evaluation 01/31/23    Authorization Type [employer] Medicare    Progress Note Due on Visit 10    PT Start Time 1017    PT Stop Time 1055    PT Time Calculation (min) 38 min    Activity Tolerance Patient tolerated treatment well    Behavior During Therapy WFL for tasks assessed/performed             Past Medical History:  Diagnosis Date   Anemia    intermittent her whole life, reports she had eval and was told to take MV   Eclampsia    GERD (gastroesophageal reflux disease) 08/03/2015   resolved 2016   Hyperglycemia 02/19/2013   Hyperlipidemia 02/19/2013   Hypertension    Obesity 08/03/2015   Past Surgical History:  Procedure Laterality Date   ABDOMINAL HYSTERECTOMY     heavy menses. no concerns for cancer.    CHOLECYSTECTOMY  11/10/12   lap chole with IOC   shoulder shoulder Bilateral    rotator cuff bilat; right shoulder also frozen.    Patient Active Problem List   Diagnosis Date Noted   Vitamin D deficiency 02/18/2022   Hyperlipidemia associated with type 2 diabetes mellitus (HCC) 10/02/2017   Type 2 diabetes mellitus without complication, without long-term current use of insulin (HCC) 10/02/2017   Morbid obesity (HCC) 08/03/2015   Essential hypertension 01/19/2007    PCP: Philip Aspen, Limmie Patricia, MD  REFERRING PROVIDER: Philip Aspen, Limmie Patricia, MD  REFERRING DIAG: [phone] (ICD-10-CM) - Acute right-sided low back pain with right-sided sciatica  Rationale for Evaluation and Treatment: Rehabilitation  THERAPY DIAG:  Low back pain with right-sided sciatica, unspecified back pain laterality, unspecified chronicity  Muscle weakness (generalized)  Cramp and spasm  ONSET DATE: early May when on vacation  SUBJECTIVE:                                                                                                                                                                                            SUBJECTIVE STATEMENT: Pt reports feeling better after last visit.  PERTINENT HISTORY:  Anemia, HTN, bilateral shoulder rotator cuff repair  PAIN:  Are you having pain? Yes: NPRS scale: 3/10 Pain location: low back, radiating down right leg Pain description: stabbing Aggravating factors: movement Relieving factors: rest  PRECAUTIONS: None  WEIGHT BEARING RESTRICTIONS: No  FALLS:  Has  patient fallen in last 6 months? No  LIVING ENVIRONMENT: Lives with: lives with their family Lives in: House/apartment Stairs: Yes: External: 3 steps; can reach both Has following equipment at home: None  OCCUPATION: Recently Retired from working for [employer] in the [employer] department  PLOF: Independent and Leisure: travel, decorating  PATIENT GOALS: To feel better.  NEXT MD VISIT: As needed  OBJECTIVE:   DIAGNOSTIC FINDINGS:  Bone Density on 05/27/2022: Lumbar T score +0.4 Femoral Neck T score:  right- +0.1, left -0.3 Fracture Risk:  low  PATIENT SURVEYS:  Eval:  FOTO 56% (projected 72% by visit 10)  SCREENING FOR RED FLAGS: Bowel or bladder incontinence: No Spinal tumors: No Cauda equina syndrome: No Compression fracture: No Abdominal aneurysm: No  COGNITION: Overall cognitive status: Within functional limits for tasks assessed     SENSATION: WFL  MUSCLE LENGTH: Hamstrings: some tightness noted in right side  POSTURE: rounded shoulders and forward head  PALPATION: Denies tenderness to palpation, but does have some tightness/spasms in right side lumbar paraspinals and gluteal region  LUMBAR ROM:   12/10/2022:  WFL  LOWER EXTREMITY ROM:     12/10/2022:  WFL  LOWER EXTREMITY MMT:    12/10/2022:   Right hip strength is 4 to 4+/5 grossly throughout, right hamstring is 4+/5 Left LE  strength is WFL  LUMBAR SPECIAL TESTS:   12/10/2022: Slump test: Positive  FUNCTIONAL TESTS:   12/10/2022: 5 times sit to stand: 8.59 sec without UE use Timed up and go (TUG): 7.03 ec  GAIT: Distance walked: >500 ft Assistive device utilized: None Level of assistance: Complete Independence Comments: Pt did not report increased pain with shorter distance ambulation  TODAY'S TREATMENT:          DATE: 01/16/2023  Nustep level 5 x6 min with PT present to discuss status Seated piriformis stretch 2x20 sec Seated hamstring stretch 2x20 sec Seated modified deadlift x5 for technique, 2x10 with 5# kettlebell Wall Squats 3x15 sec FWD step ups on bosu ball with UE support 2x10 (with tactile and verbal cuing for alignment and proper technique) Split lunges 2x10 bilat Supine bridge 2x10 Supine clamshell with red tband 2x10   DATE: 01/14/2023  Nustep level 5 x6 min with PT present to discuss status Seated piriformis stretch 2x20 sec Seated hamstring stretch 2x20 sec Leaning prone over red pball performing alt UE/LE 2x10 Deep squats to 16 inch box holding 10# dumbbell 3x10 Backwards lunge with foot on slider 2x10 bilat FWD lunges with cuing to "push off the glutes" 2x10 bilat 9 inch controlled step downs 3x10 bilat   DATE: 01/09/2023  Nustep level 5 x6 min with PT present to discuss status Discussed sitting positioning and postural alignment Pallof series staggered stance red band: 5x press out, press up, press out and up, stir the pot each way Prone over the ball: UE raises 5x, LE raise 5x; opp UE/LE 5x2 not alternating Pallof style arms out straight red band: marching and retro toe touch 5x each from each side Leaning over tall hi-lo table at crease of hips: single leg lift right/left and double leg lift 5x each Seated green ball 3 way back stretch (pt got ball for home use) Sitting on green ball pelvic rocks and circles Box squats 16 inch 10# kettlebell goblet style 20x Matrix low  row 25# 20x Discussion of strategies for the car/sitting     PATIENT EDUCATION:  Education details: Issued HEP Person educated: Patient Education method: Explanation Education comprehension: verbalized understanding  and returned demonstration  HOME EXERCISE PROGRAM: Access Code: [email or web address] URL: [email or web address] Date: 12/10/2022 Prepared by: Clydie Braun Iyauna Sing  Exercises - Supine Lower Trunk Rotation  - 1-2 x daily - 7 x weekly - 1 sets - 10 reps - Supine Posterior Pelvic Tilt  - 1-2 x daily - 7 x weekly - 2 sets - 10 reps - Supine Piriformis Stretch with Foot on Ground  - 1-2 x daily - 7 x weekly - 1 sets - 2 reps - 20 sec hold - Supine Sciatic Nerve Glide  - 1 x daily - 7 x weekly - 1 sets - 10 reps - 1 sec hold - Supine Transversus Abdominis Bracing - Hands on Thighs  - 1-2 x daily - 7 x weekly - 2 sets - 10 reps - Standing 'L' Stretch at Counter  - 2 x daily - 7 x weekly - 1 sets - 2 reps - 20 sec hold - Seated Piriformis Stretch  - 1-2 x daily - 7 x weekly - 1 sets - 2 reps - 20 sec hold - Seated Sciatic Tensioner  - 1-2 x daily - 7 x weekly - 1-2 sets - 10 reps  ASSESSMENT:  CLINICAL IMPRESSION: Ms Bernardi presents to skilled PT reporting that she is feeling better today than last session.  Patient able to progress through session with minimal cuing for technique and proper alignment.  Patient with some fatigue, but did not report increased pain during session.  Patient is progressing with ability to tolerate increased exercise progression and improved functional strength and core stability.     OBJECTIVE IMPAIRMENTS: difficulty walking, decreased strength, increased muscle spasms, improper body mechanics, postural dysfunction, and pain.   ACTIVITY LIMITATIONS: lifting and bending  PARTICIPATION LIMITATIONS: community activity  PERSONAL FACTORS: 1 comorbidity: HTN  are also affecting patient's functional outcome.   REHAB POTENTIAL: [place]  CLINICAL DECISION  MAKING: Stable/uncomplicated  EVALUATION COMPLEXITY: Low   GOALS: Goals reviewed with patient? Yes  SHORT TERM GOALS: Target date: 12/27/2022  Pt will be independent with initial HEP. Baseline: Goal status: MET  2.  Patient will report at least a 40% improvement in symptoms since starting PT. Baseline:  Goal status: MET   LONG TERM GOALS: Target date: 01/31/2023  Pt will be independent with advanced HEP. Baseline:  Goal status: IN PROGRESS  2.  Patient will increase FOTO to at least 72% to demonstrate improvements in functional mobility. Baseline: 56% Goal status: IN PROGRESS  3.  Patient will increase right hip and hamstring strength to at least 4+ to 5-/5 to improve gait pattern and ability to navigate stairs. Baseline:  Goal status: IN PROGRESS  4.  Patient will report ability to perform desired community activities and ambulation without increased pain. Baseline:  Goal status: IN PROGRESS   PLAN:  PT FREQUENCY: 1-2x/week  PT DURATION: 8 weeks  PLANNED INTERVENTIONS: Therapeutic exercises, Therapeutic activity, Neuromuscular re-education, Balance training, Gait training, Patient/Family education, Self Care, Joint mobilization, Joint manipulation, Stair training, Aquatic Therapy, Dry Needling, Electrical stimulation, Cryotherapy, Moist heat, Taping, Traction, Ultrasound, Ionotophoresis 4mg /ml Dexamethasone, Manual therapy, and Re-evaluation.  PLAN FOR NEXT SESSION: Assess and progress HEP as indicated, strengthening, core stability, flexibility    Ailene Ards, SPT Reather Laurence, PT, DPT 01/16/23 11:20 AM   [facility] [address], [address] [place], [place] [address] Phone # [phone] Fax [phone]

## 2023-01-21 ENCOUNTER — Encounter: Payer: Self-pay | Admitting: Rehabilitative and Restorative Service Providers"

## 2023-01-21 ENCOUNTER — Ambulatory Visit: Payer: Medicare Other | Attending: Internal Medicine | Admitting: Rehabilitative and Restorative Service Providers"

## 2023-01-21 DIAGNOSIS — M6281 Muscle weakness (generalized): Secondary | ICD-10-CM | POA: Diagnosis not present

## 2023-01-21 DIAGNOSIS — M5441 Lumbago with sciatica, right side: Secondary | ICD-10-CM | POA: Insufficient documentation

## 2023-01-21 DIAGNOSIS — R252 Cramp and spasm: Secondary | ICD-10-CM | POA: Diagnosis not present

## 2023-01-21 NOTE — Therapy (Signed)
OUTPATIENT PHYSICAL THERAPY TREATMENT NOTE   Patient Name: Erica Sutton MRN: [id number] DOB:Feb 08, 1956, 67 y.o., female Today's Date: 01/21/2023  END OF SESSION:  PT End of Session - 01/21/23 1104     Visit Number 9    Date for PT Re-Evaluation 01/31/23    Authorization Type [employer] Medicare    Progress Note Due on Visit 10    PT Start Time 1057    PT Stop Time 1135    PT Time Calculation (min) 38 min    Activity Tolerance Patient tolerated treatment well    Behavior During Therapy WFL for tasks assessed/performed             Past Medical History:  Diagnosis Date   Anemia    intermittent her whole life, reports she had eval and was told to take MV   Eclampsia    GERD (gastroesophageal reflux disease) 08/03/2015   resolved 2016   Hyperglycemia 02/19/2013   Hyperlipidemia 02/19/2013   Hypertension    Obesity 08/03/2015   Past Surgical History:  Procedure Laterality Date   ABDOMINAL HYSTERECTOMY     heavy menses. no concerns for cancer.    CHOLECYSTECTOMY  11/10/12   lap chole with IOC   shoulder shoulder Bilateral    rotator cuff bilat; right shoulder also frozen.    Patient Active Problem List   Diagnosis Date Noted   Vitamin D deficiency 02/18/2022   Hyperlipidemia associated with type 2 diabetes mellitus (HCC) 10/02/2017   Type 2 diabetes mellitus without complication, without long-term current use of insulin (HCC) 10/02/2017   Morbid obesity (HCC) 08/03/2015   Essential hypertension 01/19/2007    PCP: Philip Aspen, Limmie Patricia, MD  REFERRING PROVIDER: Philip Aspen, Limmie Patricia, MD  REFERRING DIAG: [phone] (ICD-10-CM) - Acute right-sided low back pain with right-sided sciatica  Rationale for Evaluation and Treatment: Rehabilitation  THERAPY DIAG:  Low back pain with right-sided sciatica, unspecified back pain laterality, unspecified chronicity  Muscle weakness (generalized)  Cramp and spasm  ONSET DATE: early May when on vacation  SUBJECTIVE:                                                                                                                                                                                            SUBJECTIVE STATEMENT: Pt denies pain, states that she has not been having any pain since last PT visit.  Pt reports that she is going to the beach for a couple of days.  PERTINENT HISTORY:  Anemia, HTN, bilateral shoulder rotator cuff repair  PAIN:  Are you having pain? Yes: NPRS scale: 0/10 Pain location: low back, radiating  down right leg Pain description: stabbing Aggravating factors: movement Relieving factors: rest  PRECAUTIONS: None  WEIGHT BEARING RESTRICTIONS: No  FALLS:  Has patient fallen in last 6 months? No  LIVING ENVIRONMENT: Lives with: lives with their family Lives in: House/apartment Stairs: Yes: External: 3 steps; can reach both Has following equipment at home: None  OCCUPATION: Recently Retired from working for [employer] in the [employer] department  PLOF: Independent and Leisure: travel, decorating  PATIENT GOALS: To feel better.  NEXT MD VISIT: As needed  OBJECTIVE:   DIAGNOSTIC FINDINGS:  Bone Density on 05/27/2022: Lumbar T score +0.4 Femoral Neck T score:  right- +0.1, left -0.3 Fracture Risk:  low  PATIENT SURVEYS:  Eval:  FOTO 56% (projected 72% by visit 10) 01/21/2023:  FOTO 94%  SCREENING FOR RED FLAGS: Bowel or bladder incontinence: No Spinal tumors: No Cauda equina syndrome: No Compression fracture: No Abdominal aneurysm: No  COGNITION: Overall cognitive status: Within functional limits for tasks assessed     SENSATION: WFL  MUSCLE LENGTH: Hamstrings: some tightness noted in right side  POSTURE: rounded shoulders and forward head  PALPATION: Denies tenderness to palpation, but does have some tightness/spasms in right side lumbar paraspinals and gluteal region  LUMBAR ROM:   12/10/2022:  WFL  LOWER EXTREMITY ROM:     12/10/2022:  WFL  LOWER  EXTREMITY MMT:    12/10/2022:   Right hip strength is 4 to 4+/5 grossly throughout, right hamstring is 4+/5 Left LE strength is WFL  LUMBAR SPECIAL TESTS:   12/10/2022: Slump test: Positive  FUNCTIONAL TESTS:   12/10/2022: 5 times sit to stand: 8.59 sec without UE use Timed up and go (TUG): 7.03 ec  GAIT: Distance walked: >500 ft Assistive device utilized: None Level of assistance: Complete Independence Comments: Pt did not report increased pain with shorter distance ambulation  TODAY'S TREATMENT:          DATE: 01/21/2023  Nustep level 5 x6 min with PT present to discuss status Seated modified deadlift with 10# kettlebell 2x10  Seated piriformis stretch 2x20 sec Seated hamstring stretch 2x20 sec Leg Press (seat at 6) 80# 2x10 Wall Squats 3x20 sec FWD lunge onto bosu ball 2x10 Supine bridge with abduction with yellow loop 2x10   DATE: 01/16/2023  Nustep level 5 x6 min with PT present to discuss status Seated piriformis stretch 2x20 sec Seated hamstring stretch 2x20 sec Seated modified deadlift x5 for technique, 2x10 with 5# kettlebell Wall Squats 3x15 sec FWD step ups on bosu ball with UE support 2x10 (with tactile and verbal cuing for alignment and proper technique) Split lunges 2x10 bilat Supine bridge 2x10 Supine clamshell with red tband 2x10   DATE: 01/14/2023  Nustep level 5 x6 min with PT present to discuss status Seated piriformis stretch 2x20 sec Seated hamstring stretch 2x20 sec Leaning prone over red pball performing alt UE/LE 2x10 Deep squats to 16 inch box holding 10# dumbbell 3x10 Backwards lunge with foot on slider 2x10 bilat FWD lunges with cuing to "push off the glutes" 2x10 bilat 9 inch controlled step downs 3x10 bilat   DATE: 01/09/2023  Nustep level 5 x6 min with PT present to discuss status Discussed sitting positioning and postural alignment Pallof series staggered stance red band: 5x press out, press up, press out and up, stir the pot each  way Prone over the ball: UE raises 5x, LE raise 5x; opp UE/LE 5x2 not alternating Pallof style arms out straight red band: marching and retro toe  touch 5x each from each side Leaning over tall hi-lo table at crease of hips: single leg lift right/left and double leg lift 5x each Seated green ball 3 way back stretch (pt got ball for home use) Sitting on green ball pelvic rocks and circles Box squats 16 inch 10# kettlebell goblet style 20x Matrix low row 25# 20x Discussion of strategies for the car/sitting     PATIENT EDUCATION:  Education details: Issued HEP Person educated: Patient Education method: Explanation Education comprehension: verbalized understanding and returned demonstration  HOME EXERCISE PROGRAM: Access Code: [email or web address] URL: [email or web address] Date: 01/21/2023 Prepared by: Clydie Braun Sayra Frisby  Exercises - Supine Lower Trunk Rotation  - 1-2 x daily - 7 x weekly - 1 sets - 10 reps - Supine Piriformis Stretch with Foot on Ground  - 1-2 x daily - 7 x weekly - 1 sets - 2 reps - 20 sec hold - Supine Transversus Abdominis Bracing - Hands on Thighs  - 1-2 x daily - 7 x weekly - 2 sets - 10 reps - Seated Piriformis Stretch  - 1-2 x daily - 7 x weekly - 1 sets - 2 reps - 20 sec hold - Seated Sciatic Tensioner  - 1-2 x daily - 7 x weekly - 1-2 sets - 10 reps - Standing 'L' Stretch at Counter  - 2 x daily - 7 x weekly - 1 sets - 2 reps - 20 sec hold - Wall Squat  - 1 x daily - 7 x weekly - 3 reps - 15-20 sec hold - Mini Lunge  - 1 x daily - 7 x weekly - 2 sets - 5 reps  ASSESSMENT:  CLINICAL IMPRESSION: Ms Kogan presented to physical therapy with no reports of concordant pain throughout the entire session. Today's session focused on exercise progression in resistance and multimodal planes of movement address limitations in lifting and bending. Overall, the patient is making significant improvements in resistance and neuromuscular control only requiring minimal cuing  for compensatory hip external rotation due to subsequent hip abductor weakness, increased her wall squat duration without form failure, and responded well to the introduction of leg press and bridges with resistance band into hip abduction. Thus, indicating improvement of activity tolerance required for return to community activity. Patient has made great improvements with score on FOTO and has met that goal earlier than anticipated/projected.   OBJECTIVE IMPAIRMENTS: difficulty walking, decreased strength, increased muscle spasms, improper body mechanics, postural dysfunction, and pain.   ACTIVITY LIMITATIONS: lifting and bending  PARTICIPATION LIMITATIONS: community activity  PERSONAL FACTORS: 1 comorbidity: HTN  are also affecting patient's functional outcome.   REHAB POTENTIAL: [place]  CLINICAL DECISION MAKING: Stable/uncomplicated  EVALUATION COMPLEXITY: Low   GOALS: Goals reviewed with patient? Yes  SHORT TERM GOALS: Target date: 12/27/2022  Pt will be independent with initial HEP. Baseline: Goal status: MET  2.  Patient will report at least a 40% improvement in symptoms since starting PT. Baseline:  Goal status: MET   LONG TERM GOALS: Target date: 01/31/2023  Pt will be independent with advanced HEP. Baseline:  Goal status: IN PROGRESS  2.  Patient will increase FOTO to at least 72% to demonstrate improvements in functional mobility. Baseline: 56% Goal status: MET on 01/21/23  3.  Patient will increase right hip and hamstring strength to at least 4+ to 5-/5 to improve gait pattern and ability to navigate stairs. Baseline:  Goal status: IN PROGRESS  4.  Patient will report ability to perform desired community activities and  ambulation without increased pain. Baseline:  Goal status: IN PROGRESS   PLAN:  PT FREQUENCY: 1-2x/week  PT DURATION: 8 weeks  PLANNED INTERVENTIONS: Therapeutic exercises, Therapeutic activity, Neuromuscular re-education, Balance training,  Gait training, Patient/Family education, Self Care, Joint mobilization, Joint manipulation, Stair training, Aquatic Therapy, Dry Needling, Electrical stimulation, Cryotherapy, Moist heat, Taping, Traction, Ultrasound, Ionotophoresis 4mg /ml Dexamethasone, Manual therapy, and Re-evaluation.  PLAN FOR NEXT SESSION: Assess and progress HEP as indicated, strengthening, core stability, flexibility    Ailene Ards, SPT Reather Laurence, PT, DPT 01/21/23 12:39 PM   [facility] [address] [place], [place] [address] Phone # [phone] Fax [phone]

## 2023-01-22 ENCOUNTER — Ambulatory Visit: Payer: Medicare Other | Admitting: Rehabilitative and Restorative Service Providers"

## 2023-01-28 ENCOUNTER — Ambulatory Visit: Payer: Medicare Other | Admitting: Rehabilitative and Restorative Service Providers"

## 2023-01-28 ENCOUNTER — Encounter: Payer: Self-pay | Admitting: Rehabilitative and Restorative Service Providers"

## 2023-01-28 DIAGNOSIS — M6281 Muscle weakness (generalized): Secondary | ICD-10-CM

## 2023-01-28 DIAGNOSIS — M5441 Lumbago with sciatica, right side: Secondary | ICD-10-CM

## 2023-01-28 DIAGNOSIS — R252 Cramp and spasm: Secondary | ICD-10-CM

## 2023-01-28 NOTE — Therapy (Signed)
OUTPATIENT PHYSICAL THERAPY TREATMENT NOTE AND DISCHARGE SUMMARY   Patient Name: Erica Sutton MRN: [id number] DOB:[birth year], 67 y.o., female Today's Date: 01/28/2023  END OF SESSION:  PT End of Session - 01/28/23 1027     Visit Number 10    Date for PT Re-Evaluation 01/31/23    Authorization Type [employer] Medicare    PT Start Time 1015    PT Stop Time 1055    PT Time Calculation (min) 40 min    Activity Tolerance Patient tolerated treatment well    Behavior During Therapy WFL for tasks assessed/performed             Past Medical History:  Diagnosis Date   Anemia    intermittent her whole life, reports she had eval and was told to take MV   Eclampsia    GERD (gastroesophageal reflux disease) 08/03/2015   resolved 2016   Hyperglycemia 02/19/2013   Hyperlipidemia 02/19/2013   Hypertension    Obesity 08/03/2015   Past Surgical History:  Procedure Laterality Date   ABDOMINAL HYSTERECTOMY     heavy menses. no concerns for cancer.    CHOLECYSTECTOMY  11/10/12   lap chole with IOC   shoulder shoulder Bilateral    rotator cuff bilat; right shoulder also frozen.    Patient Active Problem List   Diagnosis Date Noted   Vitamin D deficiency 02/18/2022   Hyperlipidemia associated with type 2 diabetes mellitus (HCC) 10/02/2017   Type 2 diabetes mellitus without complication, without long-term current use of insulin (HCC) 10/02/2017   Morbid obesity (HCC) 08/03/2015   Essential hypertension 01/19/2007    PCP: Philip Aspen, Limmie Patricia, MD  REFERRING PROVIDER: Philip Aspen, Limmie Patricia, MD  REFERRING DIAG: [phone] (ICD-10-CM) - Acute right-sided low back pain with right-sided sciatica  Rationale for Evaluation and Treatment: Rehabilitation  THERAPY DIAG:  Low back pain with right-sided sciatica, unspecified back pain laterality, unspecified chronicity  Muscle weakness (generalized)  Cramp and spasm  ONSET DATE: early May when on vacation  SUBJECTIVE:                                                                                                                                                                                            SUBJECTIVE STATEMENT: Pt denies any pain today and reported feeling [place] after prior session denying any muscular soreness. Patient agreed that today's session would involve her discharge from the plan of care.   PERTINENT HISTORY:  Anemia, HTN, bilateral shoulder rotator cuff repair  PAIN:  Are you having pain? Yes: NPRS scale: 0/10 Pain location: low back, radiating down right leg Pain description: stabbing  Aggravating factors: movement Relieving factors: rest  PRECAUTIONS: None  WEIGHT BEARING RESTRICTIONS: No  FALLS:  Has patient fallen in last 6 months? No  LIVING ENVIRONMENT: Lives with: lives with their family Lives in: House/apartment Stairs: Yes: External: 3 steps; can reach both Has following equipment at home: None  OCCUPATION: Recently Retired from working for [employer] in the [employer] department  PLOF: Independent and Leisure: travel, decorating  PATIENT GOALS: To feel better.  NEXT MD VISIT: As needed  OBJECTIVE:   DIAGNOSTIC FINDINGS:  Bone Density on 05/27/2022: Lumbar T score +0.4 Femoral Neck T score:  right- +0.1, left -0.3 Fracture Risk:  low  PATIENT SURVEYS:  Eval:  FOTO 56% (projected 72% by visit 10) 01/21/2023:  FOTO 94%  SCREENING FOR RED FLAGS: Bowel or bladder incontinence: No Spinal tumors: No Cauda equina syndrome: No Compression fracture: No Abdominal aneurysm: No  COGNITION: Overall cognitive status: Within functional limits for tasks assessed     SENSATION: WFL  MUSCLE LENGTH: Hamstrings: some tightness noted in right side  POSTURE: rounded shoulders and forward head  PALPATION: Denies tenderness to palpation, but does have some tightness/spasms in right side lumbar paraspinals and gluteal region  LUMBAR ROM:   12/10/2022:  WFL  LOWER EXTREMITY ROM:      12/10/2022:  WFL  LOWER EXTREMITY MMT:    12/10/2022:   Right hip strength is 4 to 4+/5 grossly throughout, right hamstring is 4+/5 Left LE strength is WFL  LUMBAR SPECIAL TESTS:   12/10/2022: Slump test: Positive  FUNCTIONAL TESTS:   12/10/2022: 5 times sit to stand: 8.59 sec without UE use Timed up and go (TUG): 7.03 ec  GAIT: Distance walked: >500 ft Assistive device utilized: None Level of assistance: Complete Independence Comments: Pt did not report increased pain with shorter distance ambulation  TODAY'S TREATMENT:          DATE: 01/28/2023  Nustep level 5 x6 min with PT present to discuss status Seated piriformis stretch 2x20 sec Seated hamstring stretch 2x20 sec Wall Squats 3x20 sec Supine bridge with abduction with blue loop 2 x 10  Single leg step ups with 6 in platform 1 x 10 bilaterally  Leg press #6 100 lbs 2 x 10  Leg press #6 80 lbs 1 x 10 Bear push up 3 x 10 sec  Bridge with hip abduction blue loop 2 x 10  Lateral band walks 8 steps each direction 3 times    DATE: 01/21/2023  Nustep level 5 x6 min with PT present to discuss status Seated modified deadlift with 10# kettlebell 2x10  Seated piriformis stretch 2x20 sec Seated hamstring stretch 2x20 sec Leg Press (seat at 6) 80# 2x10 Wall Squats 3x20 sec FWD lunge onto bosu ball 2x10 Supine bridge with abduction with yellow loop 2x10   DATE: 01/16/2023  Nustep level 5 x6 min with PT present to discuss status Seated piriformis stretch 2x20 sec Seated hamstring stretch 2x20 sec Seated modified deadlift x5 for technique, 2x10 with 5# kettlebell Wall Squats 3x15 sec FWD step ups on bosu ball with UE support 2x10 (with tactile and verbal cuing for alignment and proper technique) Split lunges 2x10 bilat Supine bridge 2x10 Supine clamshell with red tband 2x10     PATIENT EDUCATION:  Education details: Issued HEP Person educated: Patient Education method: Explanation Education comprehension:  verbalized understanding and returned demonstration  HOME EXERCISE PROGRAM: Access Code: [email or web address] URL: [email or web address] Date: 01/28/2023 Prepared by: Reather Laurence  Exercises - Supine Lower Trunk Rotation  -  1-2 x daily - 7 x weekly - 1 sets - 10 reps - Supine Piriformis Stretch with Foot on Ground  - 1-2 x daily - 7 x weekly - 1 sets - 2 reps - 20 sec hold - Supine Transversus Abdominis Bracing - Hands on Thighs  - 1-2 x daily - 7 x weekly - 2 sets - 10 reps - Bridge with Hip Abduction and Resistance  - 1 x daily - 7 x weekly - 2 sets - 10 reps - Clamshell with Resistance  - 1 x daily - 7 x weekly - 2 sets - 10 reps - Bear Plank from [employer]  - 1 x daily - 7 x weekly - 1 sets - 2 reps - 15 sec hold - Seated Piriformis Stretch  - 1-2 x daily - 7 x weekly - 1 sets - 2 reps - 20 sec hold - Seated Sciatic Tensioner  - 1-2 x daily - 7 x weekly - 1-2 sets - 10 reps - Standing 'L' Stretch at Counter  - 2 x daily - 7 x weekly - 1 sets - 2 reps - 20 sec hold - Wall Squat  - 1 x daily - 7 x weekly - 3 reps - 15-20 sec hold - Mini Lunge  - 1 x daily - 7 x weekly - 2 sets - 5 reps - Side Stepping with Resistance at Ankles  - 1 x daily - 7 x weekly - 2 sets - 10 reps  ASSESSMENT:  CLINICAL IMPRESSION: Ms Shermer presented to clinic with no reports of concordant pain. Patient continues to make gains in improved body biomechanics, activity tolerance, muscular endurance, and muscular strength required for lifting and bending for subsequent community activity. Patient is ready for discharge on 01/28/2023 as evident by meeting all of her short and long term goals, demonstrating improved muscular strength and endurance by tolerating several progressions in resistance and higher level therapeutic exercise, requiring minimal verbal and tactile cues, and no reports of concordant pain the last 3 sessions. Patient also demonstrated significant improvements in neuromuscular control. SPT observed  patient perform self-corrected knee valgus and proper aligned feet in leg press, wall squats, and single leg step downs. Patient also verbalized performing several of the exercises at her gym independently indicating autonomous motor learning required for independent maintenance. Patient was eager to discharge and provided with updated HEP and reviewed exercises to perform in the gym.     OBJECTIVE IMPAIRMENTS: difficulty walking, decreased strength, increased muscle spasms, improper body mechanics, postural dysfunction, and pain.   ACTIVITY LIMITATIONS: lifting and bending  PARTICIPATION LIMITATIONS: community activity  PERSONAL FACTORS: 1 comorbidity: HTN  are also affecting patient's functional outcome.   REHAB POTENTIAL: [place]  CLINICAL DECISION MAKING: Stable/uncomplicated  EVALUATION COMPLEXITY: Low   GOALS: Goals reviewed with patient? Yes  SHORT TERM GOALS: Target date: 12/27/2022  Pt will be independent with initial HEP. Baseline: Goal status: MET  2.  Patient will report at least a 40% improvement in symptoms since starting PT. Baseline:  Goal status: MET   LONG TERM GOALS: Target date: 01/31/2023  Pt will be independent with advanced HEP. Baseline:  Goal status: MET  2.  Patient will increase FOTO to at least 72% to demonstrate improvements in functional mobility. Baseline: 56% Goal status: MET on 01/21/23  3.  Patient will increase right hip and hamstring strength to at least 4+ to 5-/5 to improve gait pattern and ability to navigate stairs. Baseline:  Goal status: MET  4.  Patient will report ability to perform desired community activities and ambulation without increased pain. Baseline:  Goal status: MET   PLAN:  PT FREQUENCY: 1-2x/week  PT DURATION: 8 weeks  PLANNED INTERVENTIONS: Therapeutic exercises, Therapeutic activity, Neuromuscular re-education, Balance training, Gait training, Patient/Family education, Self Care, Joint mobilization, Joint  manipulation, Stair training, Aquatic Therapy, Dry Needling, Electrical stimulation, Cryotherapy, Moist heat, Taping, Traction, Ultrasound, Ionotophoresis 4mg /ml Dexamethasone, Manual therapy, and Re-evaluation.    PHYSICAL THERAPY DISCHARGE SUMMARY  Patient agrees to discharge. Patient goals were met. Patient is being discharged due to meeting the stated rehab goals.    Ailene Ards, SPT Reather Laurence, PT, DPT 01/28/23 11:10 AM   [facility] [address], [address] [place], [place] [address] Phone # [phone] Fax [phone]

## 2023-01-30 ENCOUNTER — Ambulatory Visit: Payer: Medicare Other | Admitting: Rehabilitative and Restorative Service Providers"

## 2023-02-03 ENCOUNTER — Other Ambulatory Visit: Payer: Self-pay | Admitting: Internal Medicine

## 2023-02-03 DIAGNOSIS — M5441 Lumbago with sciatica, right side: Secondary | ICD-10-CM

## 2023-02-14 ENCOUNTER — Other Ambulatory Visit: Payer: Self-pay | Admitting: Internal Medicine

## 2023-02-14 MED ORDER — BENAZEPRIL HCL 40 MG PO TABS: 40.0000 mg | ORAL_TABLET | Freq: Every day | ORAL | 1 refills | Status: DC

## 2023-04-02 ENCOUNTER — Other Ambulatory Visit: Payer: Self-pay | Admitting: Internal Medicine

## 2023-04-08 ENCOUNTER — Encounter: Payer: Medicare Other | Admitting: Internal Medicine

## 2023-04-24 ENCOUNTER — Encounter: Payer: Medicare Other | Admitting: Internal Medicine

## 2023-04-29 LAB — HM DIABETES EYE EXAM

## 2023-05-05 ENCOUNTER — Ambulatory Visit: Payer: Medicare Other | Admitting: Internal Medicine

## 2023-05-05 VITALS — BP 110/78 | HR 74 | Temp 97.5°F | Ht 63.0 in | Wt 183.1 lb

## 2023-05-05 DIAGNOSIS — E1169 Type 2 diabetes mellitus with other specified complication: Secondary | ICD-10-CM

## 2023-05-05 DIAGNOSIS — E785 Hyperlipidemia, unspecified: Secondary | ICD-10-CM

## 2023-05-05 DIAGNOSIS — E119 Type 2 diabetes mellitus without complications: Secondary | ICD-10-CM

## 2023-05-05 DIAGNOSIS — Z23 Encounter for immunization: Secondary | ICD-10-CM | POA: Diagnosis not present

## 2023-05-05 DIAGNOSIS — I10 Essential (primary) hypertension: Secondary | ICD-10-CM

## 2023-05-05 DIAGNOSIS — Z0000 Encounter for general adult medical examination without abnormal findings: Secondary | ICD-10-CM

## 2023-05-05 DIAGNOSIS — E559 Vitamin D deficiency, unspecified: Secondary | ICD-10-CM | POA: Diagnosis not present

## 2023-05-05 DIAGNOSIS — Z6832 Body mass index (BMI) 32.0-32.9, adult: Secondary | ICD-10-CM

## 2023-05-05 NOTE — Addendum Note (Signed)
Addended by: Kern Reap B on: 05/05/2023 01:42 PM   Modules accepted: Orders

## 2023-05-05 NOTE — Progress Notes (Signed)
Established Patient Office Visit     CC/Reason for Visit: Annual preventive exam  HPI: Erica Sutton is a 67 y.o. female who is coming in today for the above mentioned reasons. Past Medical History is significant for: Hypertension, hyperlipidemia, type 2 diabetes, obesity.  Feeling well, no acute concerns or complaints.  Has routine eye and dental care.  Is due for pneumonia and COVID vaccines.  Cancer screening is up-to-date.   Past Medical/Surgical History: Past Medical History:  Diagnosis Date   Anemia    intermittent her whole life, reports she had eval and was told to take MV   Eclampsia    GERD (gastroesophageal reflux disease) 08/03/2015   resolved 2016   Hyperglycemia 02/19/2013   Hyperlipidemia 02/19/2013   Hypertension    Obesity 08/03/2015    Past Surgical History:  Procedure Laterality Date   ABDOMINAL HYSTERECTOMY     heavy menses. no concerns for cancer.    CHOLECYSTECTOMY  11/10/12   lap chole with IOC   shoulder shoulder Bilateral    rotator cuff bilat; right shoulder also frozen.     Social History:  reports that she has quit smoking. She has never used smokeless tobacco. She reports current alcohol use of about 3.0 standard drinks of alcohol per week. She reports that she does not use drugs.  Allergies: No Known Allergies  Family History:  Family History  Problem Relation Age of Onset   Diabetes Mother    Liver disease Father    Alcohol abuse Father    Healthy Brother    Stroke Sister 49     Current Outpatient Medications:    Accu-Chek FastClix Lancets MISC, Use as directed once a day, Disp: 102 each, Rfl: 3   ACCU-CHEK GUIDE test strip, Use as instructed once a day, Disp: 100 each, Rfl: 3   amLODipine (NORVASC) 5 MG tablet, TAKE 1 TABLET(5 MG) BY MOUTH DAILY, Disp: 90 tablet, Rfl: 1   benazepril (LOTENSIN) 40 MG tablet, Take 1 tablet (40 mg total) by mouth daily., Disp: 90 tablet, Rfl: 1   Blood Glucose Monitoring Suppl (ACCU-CHEK GUIDE ME)  w/Device KIT, 1 each by Does not apply route as directed., Disp: 1 kit, Rfl: 0   ciclopirox (PENLAC) 8 % solution, Apply topically at bedtime. Apply over nail and surrounding skin. Apply daily over previous coat. After seven (7) days, may remove with alcohol and continue cycle., Disp: 6.6 mL, Rfl: 5   hydrochlorothiazide (HYDRODIURIL) 25 MG tablet, TAKE 1 TABLET(25 MG) BY MOUTH DAILY, Disp: 90 tablet, Rfl: 1   Multiple Vitamins-Minerals (MULTIVITAMIN WITH MINERALS) tablet, Take 1 tablet by mouth daily., Disp: , Rfl:    rosuvastatin (CRESTOR) 20 MG tablet, TAKE 1 TABLET(20 MG) BY MOUTH DAILY, Disp: 90 tablet, Rfl: 1  Review of Systems:  Negative unless indicated in HPI.   Physical Exam: Vitals:   05/05/23 1018  BP: 110/78  Pulse: 74  Temp: (!) 97.5 F (36.4 C)  TempSrc: Oral  SpO2: 98%  Weight: 183 lb 1.6 oz (83.1 kg)  Height: 5\' 3"  (1.6 m)    Body mass index is 32.43 kg/m.   Physical Exam Vitals reviewed.  Constitutional:      General: She is not in acute distress.    Appearance: Normal appearance. She is not ill-appearing, toxic-appearing or diaphoretic.  HENT:     Head: Normocephalic.     Right Ear: Tympanic membrane, ear canal and external ear normal. There is no impacted cerumen.  Left Ear: Tympanic membrane, ear canal and external ear normal. There is no impacted cerumen.     Nose: Nose normal.     Mouth/Throat:     Mouth: Mucous membranes are moist.     Pharynx: Oropharynx is clear. No oropharyngeal exudate or posterior oropharyngeal erythema.  Eyes:     General: No scleral icterus.       Right eye: No discharge.        Left eye: No discharge.     Conjunctiva/sclera: Conjunctivae normal.     Pupils: Pupils are equal, round, and reactive to light.  Neck:     Vascular: No carotid bruit.  Cardiovascular:     Rate and Rhythm: Normal rate and regular rhythm.     Pulses: Normal pulses.     Heart sounds: Normal heart sounds.  Pulmonary:     Effort: Pulmonary  effort is normal. No respiratory distress.     Breath sounds: Normal breath sounds.  Abdominal:     General: Abdomen is flat. Bowel sounds are normal.     Palpations: Abdomen is soft.  Musculoskeletal:        General: Normal range of motion.     Cervical back: Normal range of motion.  Skin:    General: Skin is warm and dry.  Neurological:     General: No focal deficit present.     Mental Status: She is alert and oriented to person, place, and time. Mental status is at baseline.  Psychiatric:        Mood and Affect: Mood normal.        Behavior: Behavior normal.        Thought Content: Thought content normal.        Judgment: Judgment normal.     Flowsheet Row Office Visit from 05/05/2023 in [facility] [place]  PHQ-9 Total Score 0       Impression and Plan:  Vitamin D deficiency -     VITAMIN D 25 Hydroxy (Vit-D Deficiency, Fractures); Future  Hyperlipidemia associated with type 2 diabetes mellitus (HCC) -     Lipid panel; Future  Type 2 diabetes mellitus without complication, without long-term current use of insulin (HCC) -     CBC with Differential/Platelet; Future -     Comprehensive metabolic panel; Future -     Hemoglobin A1c; Future -     Microalbumin / creatinine urine ratio; Future  Encounter for preventive health examination  Essential hypertension  Immunization due  Morbid obesity (HCC) -     TSH; Future -     Vitamin B12; Future  -Recommend routine eye and dental care. -Healthy lifestyle discussed in detail. -Labs to be updated today. -Prostate cancer screening: N/A Health Maintenance  Topic Date Due   Medicare Annual Wellness Visit  Never done   Pneumonia Vaccine (2 of 2 - PCV) 11/02/2021   Hemoglobin A1C  07/17/2022   Yearly kidney health urinalysis for diabetes  01/16/2023   Yearly kidney function blood test for diabetes  02/14/2023   COVID-19 Vaccine (4 - 2023-24 season) 03/23/2023   Eye exam for diabetics   05/05/2023*   Cologuard (Stool DNA test)  09/06/2023   Complete foot exam   05/04/2024   Mammogram  08/14/2024   DTaP/Tdap/Td vaccine (4 - Td or Tdap) 06/04/2032   Flu Shot  Completed   DEXA scan (bone density measurement)  Completed   Hepatitis C Screening  Completed   Zoster (Shingles) Vaccine  Completed  HPV Vaccine  Aged Out  *Topic was postponed. The date shown is not the original due date.      -PCV 20 in office today.  Other immunizations are up-to-date -All cancer screening is up-to-date.     Chaya Jan, MD [facility]

## 2023-05-08 ENCOUNTER — Encounter: Payer: Self-pay | Admitting: Internal Medicine

## 2023-05-12 ENCOUNTER — Encounter: Payer: Self-pay | Admitting: Internal Medicine

## 2023-05-12 ENCOUNTER — Other Ambulatory Visit (INDEPENDENT_AMBULATORY_CARE_PROVIDER_SITE_OTHER): Payer: Medicare Other

## 2023-05-12 DIAGNOSIS — E119 Type 2 diabetes mellitus without complications: Secondary | ICD-10-CM

## 2023-05-12 DIAGNOSIS — E1169 Type 2 diabetes mellitus with other specified complication: Secondary | ICD-10-CM | POA: Diagnosis not present

## 2023-05-12 DIAGNOSIS — E559 Vitamin D deficiency, unspecified: Secondary | ICD-10-CM

## 2023-05-12 DIAGNOSIS — E785 Hyperlipidemia, unspecified: Secondary | ICD-10-CM

## 2023-05-12 LAB — LIPID PANEL
Cholesterol: 153 mg/dL (ref 0–200)
HDL: 52.7 mg/dL (ref >39.00)
LDL Cholesterol: 69 mg/dL (ref 0–99)
NonHDL: 100.13
Total CHOL/HDL Ratio: 3
Triglycerides: 154 mg/dL — ABNORMAL HIGH (ref 0.0–149.0)
VLDL: 30.8 mg/dL (ref 0.0–40.0)

## 2023-05-12 LAB — CBC WITH DIFFERENTIAL/PLATELET
Basophils Absolute: 0 10*3/uL (ref 0.0–0.1)
Basophils Relative: 0.5 % (ref 0.0–3.0)
Eosinophils Absolute: 0.1 10*3/uL (ref 0.0–0.7)
Eosinophils Relative: 1.8 % (ref 0.0–5.0)
HCT: 39.7 % (ref 36.0–46.0)
Hemoglobin: 13 g/dL (ref 12.0–15.0)
Lymphocytes Relative: 42.8 % (ref 12.0–46.0)
Lymphs Abs: 2.3 10*3/uL (ref 0.7–4.0)
MCHC: 32.9 g/dL (ref 30.0–36.0)
MCV: 96.6 fL (ref 78.0–100.0)
Monocytes Absolute: 0.4 10*3/uL (ref 0.1–1.0)
Monocytes Relative: 6.6 % (ref 3.0–12.0)
Neutro Abs: 2.6 10*3/uL (ref 1.4–7.7)
Neutrophils Relative %: 48.3 % (ref 43.0–77.0)
Platelets: 313 10*3/uL (ref 150.0–400.0)
RBC: 4.1 Mil/uL (ref 3.87–5.11)
RDW: 12 % (ref 11.5–15.5)
WBC: 5.3 10*3/uL (ref 4.0–10.5)

## 2023-05-12 LAB — COMPREHENSIVE METABOLIC PANEL
ALT: 17 U/L (ref 0–35)
AST: 20 U/L (ref 0–37)
Albumin: 4.5 g/dL (ref 3.5–5.2)
Alkaline Phosphatase: 63 U/L (ref 39–117)
BUN: 17 mg/dL (ref 6–23)
CO2: 30 meq/L (ref 19–32)
Calcium: 9.9 mg/dL (ref 8.4–10.5)
Chloride: 100 meq/L (ref 96–112)
Creatinine, Ser: 1.05 mg/dL (ref 0.40–1.20)
GFR: 55.36 mL/min — ABNORMAL LOW (ref >60.00)
Glucose, Bld: 134 mg/dL — ABNORMAL HIGH (ref 70–99)
Potassium: 4 meq/L (ref 3.5–5.1)
Sodium: 138 meq/L (ref 135–145)
Total Bilirubin: 0.8 mg/dL (ref 0.2–1.2)
Total Protein: 7.4 g/dL (ref 6.0–8.3)

## 2023-05-12 LAB — HEMOGLOBIN A1C: Hgb A1c MFr Bld: 5.9 % (ref 4.6–6.5)

## 2023-05-12 LAB — TSH: TSH: 0.9 u[IU]/mL (ref 0.35–5.50)

## 2023-05-12 LAB — VITAMIN B12: Vitamin B-12: 501 pg/mL (ref 211–911)

## 2023-05-12 LAB — VITAMIN D 25 HYDROXY (VIT D DEFICIENCY, FRACTURES): VITD: 31.97 ng/mL (ref 30.00–100.00)

## 2023-05-20 ENCOUNTER — Encounter: Payer: Self-pay | Admitting: Internal Medicine

## 2023-05-26 ENCOUNTER — Telehealth: Payer: Self-pay | Admitting: Internal Medicine

## 2023-05-26 NOTE — Telephone Encounter (Signed)
Placed on Dr Hernandez's desk 

## 2023-05-26 NOTE — Telephone Encounter (Signed)
Patient dropped off document  [employer] , to be filled out by provider. Patient requested to send it back via Call Patient to pick up within 7-days. Document is located in providers tray at front office.Please advise at [facility] [phone]

## 2023-05-29 NOTE — Telephone Encounter (Signed)
Form is ready to be picked up and the patient is aware 

## 2023-06-13 ENCOUNTER — Telehealth: Payer: Self-pay

## 2023-06-13 NOTE — Patient Outreach (Signed)
Attempted to contact patient regarding care gaps. Left voicemail for patient to return my call at [phone].  Nicholes Rough, CMA Care Guide [employer]

## 2023-06-27 DIAGNOSIS — M7731 Calcaneal spur, right foot: Secondary | ICD-10-CM | POA: Diagnosis not present

## 2023-06-27 DIAGNOSIS — M79671 Pain in right foot: Secondary | ICD-10-CM | POA: Diagnosis not present

## 2023-07-09 DIAGNOSIS — R2689 Other abnormalities of gait and mobility: Secondary | ICD-10-CM | POA: Diagnosis not present

## 2023-07-09 DIAGNOSIS — M25571 Pain in right ankle and joints of right foot: Secondary | ICD-10-CM | POA: Diagnosis not present

## 2023-07-11 DIAGNOSIS — M25571 Pain in right ankle and joints of right foot: Secondary | ICD-10-CM | POA: Diagnosis not present

## 2023-07-11 DIAGNOSIS — R2689 Other abnormalities of gait and mobility: Secondary | ICD-10-CM | POA: Diagnosis not present

## 2023-07-15 DIAGNOSIS — M25571 Pain in right ankle and joints of right foot: Secondary | ICD-10-CM | POA: Diagnosis not present

## 2023-07-15 DIAGNOSIS — R2689 Other abnormalities of gait and mobility: Secondary | ICD-10-CM | POA: Diagnosis not present

## 2023-07-24 ENCOUNTER — Encounter: Payer: Self-pay | Admitting: Internal Medicine

## 2023-07-25 ENCOUNTER — Ambulatory Visit (INDEPENDENT_AMBULATORY_CARE_PROVIDER_SITE_OTHER): Payer: Medicare Other

## 2023-07-25 DIAGNOSIS — Z111 Encounter for screening for respiratory tuberculosis: Secondary | ICD-10-CM

## 2023-07-25 NOTE — Progress Notes (Signed)
 PPD Placement note Erica Sutton, 68 y.o. female is here today for placement of PPD test Reason for PPD test: job Pt taken PPD test before: yes Verified in allergy area and with patient that they are not allergic to the products PPD is made of (Phenol or Tween). No:  Is patient taking any oral or IV steroid medication now or have they taken it in the last month? no Has the patient ever received the BCG vaccine?: no Has the patient been in recent contact with anyone known or suspected of having active TB disease?: no      Date of exposure (if applicable):       Name of person they were exposed to (if applicable):  Patient's Country of origin?: [place]  O: Alert and oriented in NAD. P:  PPD placed on 07/25/2023.  Patient advised to return for reading within 48-72 hours.  PPD was placed on L fore arm.

## 2023-07-28 ENCOUNTER — Ambulatory Visit: Payer: Medicare Other | Admitting: *Deleted

## 2023-07-28 DIAGNOSIS — Z111 Encounter for screening for respiratory tuberculosis: Secondary | ICD-10-CM

## 2023-07-28 LAB — TB SKIN TEST
Induration: 0 mm
TB Skin Test: NEGATIVE

## 2023-07-28 NOTE — Progress Notes (Signed)
 Per orders of Dr. Ardyth Harps, PPD reading was done by Kern Reap.

## 2023-07-29 DIAGNOSIS — M25571 Pain in right ankle and joints of right foot: Secondary | ICD-10-CM | POA: Diagnosis not present

## 2023-07-29 DIAGNOSIS — R2689 Other abnormalities of gait and mobility: Secondary | ICD-10-CM | POA: Diagnosis not present

## 2023-07-29 NOTE — Telephone Encounter (Signed)
 PPD was read 07/28/23.

## 2023-08-05 DIAGNOSIS — R2689 Other abnormalities of gait and mobility: Secondary | ICD-10-CM | POA: Diagnosis not present

## 2023-08-05 DIAGNOSIS — M25571 Pain in right ankle and joints of right foot: Secondary | ICD-10-CM | POA: Diagnosis not present

## 2023-08-07 DIAGNOSIS — R2689 Other abnormalities of gait and mobility: Secondary | ICD-10-CM | POA: Diagnosis not present

## 2023-08-07 DIAGNOSIS — M25571 Pain in right ankle and joints of right foot: Secondary | ICD-10-CM | POA: Diagnosis not present

## 2023-08-12 DIAGNOSIS — M25571 Pain in right ankle and joints of right foot: Secondary | ICD-10-CM | POA: Diagnosis not present

## 2023-08-12 DIAGNOSIS — R2689 Other abnormalities of gait and mobility: Secondary | ICD-10-CM | POA: Diagnosis not present

## 2023-08-13 ENCOUNTER — Other Ambulatory Visit: Payer: Self-pay | Admitting: Internal Medicine

## 2023-08-13 DIAGNOSIS — Z1231 Encounter for screening mammogram for malignant neoplasm of breast: Secondary | ICD-10-CM

## 2023-08-14 DIAGNOSIS — R2689 Other abnormalities of gait and mobility: Secondary | ICD-10-CM | POA: Diagnosis not present

## 2023-08-14 DIAGNOSIS — M25571 Pain in right ankle and joints of right foot: Secondary | ICD-10-CM | POA: Diagnosis not present

## 2023-08-16 ENCOUNTER — Other Ambulatory Visit: Payer: Self-pay | Admitting: Internal Medicine

## 2023-08-18 ENCOUNTER — Ambulatory Visit
Admission: RE | Admit: 2023-08-18 | Discharge: 2023-08-18 | Disposition: A | Discharge status: Home / Self Care | Payer: Medicare Other | Source: Ambulatory Visit | Attending: Internal Medicine | Admitting: Internal Medicine

## 2023-08-18 DIAGNOSIS — Z1231 Encounter for screening mammogram for malignant neoplasm of breast: Secondary | ICD-10-CM | POA: Diagnosis not present

## 2023-09-20 ENCOUNTER — Other Ambulatory Visit: Payer: Self-pay | Admitting: Internal Medicine

## 2023-09-23 DIAGNOSIS — H43313 Vitreous membranes and strands, bilateral: Secondary | ICD-10-CM | POA: Diagnosis not present

## 2023-10-07 DIAGNOSIS — M25571 Pain in right ankle and joints of right foot: Secondary | ICD-10-CM | POA: Diagnosis not present

## 2023-10-07 DIAGNOSIS — M7731 Calcaneal spur, right foot: Secondary | ICD-10-CM | POA: Diagnosis not present

## 2023-10-07 DIAGNOSIS — M79671 Pain in right foot: Secondary | ICD-10-CM | POA: Diagnosis not present

## 2023-10-27 DIAGNOSIS — M79671 Pain in right foot: Secondary | ICD-10-CM | POA: Diagnosis not present

## 2023-10-28 ENCOUNTER — Ambulatory Visit: Payer: Medicare Other | Admitting: Family Medicine

## 2023-11-03 DIAGNOSIS — M6701 Short Achilles tendon (acquired), right ankle: Secondary | ICD-10-CM | POA: Diagnosis not present

## 2023-11-03 DIAGNOSIS — M2021 Hallux rigidus, right foot: Secondary | ICD-10-CM | POA: Diagnosis not present

## 2023-12-09 ENCOUNTER — Encounter: Payer: Self-pay | Admitting: Internal Medicine

## 2023-12-09 DIAGNOSIS — Z1211 Encounter for screening for malignant neoplasm of colon: Secondary | ICD-10-CM

## 2024-01-27 DIAGNOSIS — H5213 Myopia, bilateral: Secondary | ICD-10-CM | POA: Diagnosis not present

## 2024-02-02 ENCOUNTER — Encounter: Payer: Self-pay | Admitting: Internal Medicine

## 2024-02-02 DIAGNOSIS — Z1211 Encounter for screening for malignant neoplasm of colon: Secondary | ICD-10-CM

## 2024-02-10 DIAGNOSIS — Z1211 Encounter for screening for malignant neoplasm of colon: Secondary | ICD-10-CM | POA: Diagnosis not present

## 2024-02-17 LAB — COLOGUARD: COLOGUARD: NEGATIVE

## 2024-02-18 ENCOUNTER — Ambulatory Visit: Payer: Self-pay | Admitting: Internal Medicine

## 2024-02-20 ENCOUNTER — Other Ambulatory Visit: Payer: Self-pay | Admitting: Internal Medicine

## 2024-02-26 ENCOUNTER — Encounter: Payer: Self-pay | Admitting: Family Medicine

## 2024-02-26 ENCOUNTER — Ambulatory Visit: Admitting: Family Medicine

## 2024-02-26 VITALS — Wt 180.0 lb

## 2024-02-26 DIAGNOSIS — Z0000 Encounter for general adult medical examination without abnormal findings: Secondary | ICD-10-CM

## 2024-02-26 NOTE — Progress Notes (Signed)
 PATIENT CHECK-IN and HEALTH RISK ASSESSMENT QUESTIONNAIRE:  -completed by phone/video for upcoming Medicare Preventive Visit   Pre-Visit Check-in: 1)Vitals (height, wt, BP, etc) - record in vitals section for visit on day of visit Request home vitals (wt, BP, etc.) and enter into vitals, THEN update Vital Signs SmartPhrase below at the top of the HPI. See below.  2)Review and Update Medications, Allergies PMH, Surgeries, Social history in Epic 3)Hospitalizations in the last year with date/reason? no  4)Review and Update Care Team (patient's specialists) in Epic 5) Complete PHQ9 in Epic  6) Complete Fall Screening in Epic 7)Review all Health Maintenance Due and order if not done.  Medicare Wellness Patient Questionnaire:  Answer theses question about your habits: How often do you have a drink containing alcohol? 2 drinks a week How many drinks containing alcohol do you have on a typical day when you are drinking?2 How often do you have six or more drinks on one occasion?no Have you ever smoked?yes Quit date if applicable? 43 years ago  How many packs a day do/did you smoke? Pack a week Do you use smokeless tobacco? no Do you use an illicit drugs? no On average, how many days per week do you engage in moderate to strenuous exercise (like a brisk walk)? 3-4 times a week On average, how many minutes do you engage in exercise at this level? 1 hour; cardio, weights, and walking Are you sexually active?  Yes Number of partners? 1 Typical breakfast:  fruit Typical lunch: tuna or chicken breast - veggies Typical dinner: fish - loves seafood, chicken, beef 2 x a month, greens, salads, kale - lots of veggies Typical snacks: M&M peanut butter and bananas, cucumbers and vinegar   Beverages: mostly water with lemon juice   Answer theses question about your everyday activities: Can you perform most household chores? yes Are you deaf or have significant trouble hearing? no Do you feel that you  have a problem with memory?no Do you feel safe at home? yes Last dentist visit? April 2025 8. Do you have any difficulty performing your everyday activities? no Are you having any difficulty walking, taking medications on your own, and or difficulty managing daily home needs? no Do you have difficulty walking or climbing stairs? no Do you have difficulty dressing or bathing? no Do you have difficulty doing errands alone such as visiting a doctor's office or shopping? no Do you currently have any difficulty preparing food and eating? no Do you currently have any difficulty using the toilet?no Do you have any difficulty managing your finances?no Do you have any difficulties with housekeeping of managing your housekeeping?no   Do you have Advanced Directives in place (Living Will, Healthcare Power or Attorney)? yes   Last eye Exam and location? July 2025 Dr [facility]   Do you currently use prescribed or non-prescribed narcotic or opioid pain medications? no  Do you have a history or close family history of breast, ovarian, tubal or peritoneal cancer or a family member with BRCA (breast cancer susceptibility 1 and 2) gene mutations? no  Request home vitals (wt, BP, etc.) and enter into vitals, THEN update Vital Signs SmartPhrase below at the top of the HPI. See below.   Nurse/Assistant Credentials/time stamp:   Vernell English CMA ----------------------------------------------------------------------------------------------------------------------------------------------------------------------------------------------------------------------  Because this visit was a virtual/telehealth visit, some criteria may be missing or patient reported. Any vitals not documented were not able to be obtained and vitals that have been documented are patient reported.  MEDICARE ANNUAL PREVENTIVE VISIT WITH PROVIDER: (Welcome to Medicare, initial annual wellness or annual wellness  exam)  Virtual Visit via Video Note  I connected with Erica Sutton on 02/26/24 by a video enabled telemedicine application and verified that I am speaking with the correct person using two identifiers.  Location patient: home Location provider:work or home office Persons participating in the virtual visit: patient, provider  Concerns and/or follow up today: stable   See HM section in Epic for other details of completed HM.    ROS: negative for report of fevers, unintentional weight loss, vision changes, vision loss, hearing loss or change, chest pain, sob, hemoptysis, melena, hematochezia, hematuria, falls, bleeding or bruising, thoughts of suicide or self harm, memory loss  Patient-completed extensive health risk assessment - reviewed and discussed with the patient: See Health Risk Assessment completed with patient prior to the visit either above or in recent phone note. This was reviewed in detailed with the patient today and appropriate recommendations, orders and referrals were placed as needed per Summary below and patient instructions.   Review of Medical History: -PMH, PSH, Family History and current specialty and care providers reviewed and updated and listed below   Patient Care Team: Theophilus Andrews, Tully GRADE, MD as PCP - General (Internal Medicine) Watt Hun, NP (Nurse Practitioner) Leigh Venetia CROME, MD as Consulting Physician (Neurology)   Past Medical History:  Diagnosis Date   Anemia    intermittent her whole life, reports she had eval and was told to take MV   Eclampsia    GERD (gastroesophageal reflux disease) 08/03/2015   resolved 2016   Hyperglycemia 02/19/2013   Hyperlipidemia 02/19/2013   Hypertension    Obesity 08/03/2015    Past Surgical History:  Procedure Laterality Date   ABDOMINAL HYSTERECTOMY     heavy menses. no concerns for cancer.    CHOLECYSTECTOMY  11/10/12   lap chole with IOC   shoulder shoulder Bilateral    rotator cuff bilat; right  shoulder also frozen.     Social History   Socioeconomic History   Marital status: Married    Spouse name: Not on file   Number of children: Not on file   Years of education: Not on file   Highest education level: Master's degree (e.g., MA, MS, MEng, MEd, MSW, MBA)  Occupational History   Not on file  Tobacco Use   Smoking status: Former   Smokeless tobacco: Never   Tobacco comments:    quit in her 23s, light smoker  Vaping Use   Vaping status: Never Used  Substance and Sexual Activity   Alcohol use: Yes    Alcohol/week: 3.0 standard drinks of alcohol    Types: 3 Glasses of wine per week    Comment: occ   Drug use: No   Sexual activity: Yes    Birth control/protection: Surgical    Comment: hysterectomy   Other Topics Concern   Not on file  Social History Narrative   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 [employer] Situation: lives with her husband and 45 yo son (baby of 4 children) (2016)      Spiritual Beliefs: Sherlean, Baptist      Lifestyle: not great 03/2016      Right Handed    Lives in a one story home    Social Drivers of Health   Financial Resource Strain: Low Risk  (02/25/2024)   Overall Financial Resource Strain (CARDIA)  Difficulty of Paying Living Expenses: Not very hard  Food Insecurity: No Food Insecurity (02/25/2024)   Hunger Vital Sign    Worried About Running Out of Food in the Last Year: Never true    Ran Out of Food in the Last Year: Never true  Transportation Needs: No Transportation Needs (02/25/2024)   PRAPARE - Administrator, Civil Service (Medical): No    Lack of Transportation (Non-Medical): No  Physical Activity: Sufficiently Active (02/25/2024)   Exercise Vital Sign    Days of Exercise per Week: 4 days    Minutes of Exercise per Session: 60 min  Stress: No Stress Concern Present (02/25/2024)   [employer] of Occupational Health - Occupational Stress Questionnaire    Feeling of Stress: Only a little  Social  Connections: Unknown (02/25/2024)   Social Connection and Isolation Panel    Frequency of Communication with Friends and Family: More than three times a week    Frequency of Social Gatherings with Friends and Family: More than three times a week    Attends Religious Services: 1 to 4 times per year    Active Member of [employer] or Organizations: Patient declined    Attends Banker Meetings: Not on file    Marital Status: Married  Intimate Partner Violence: Not At Risk (02/26/2024)   Humiliation, Afraid, Rape, and Kick questionnaire    Fear of Current or Ex-Partner: No    Emotionally Abused: No    Physically Abused: No    Sexually Abused: No    Family History  Problem Relation Age of Onset   Diabetes Mother    Liver disease Father    Alcohol abuse Father    Healthy Brother    Stroke Sister 83    Current Outpatient Medications on File Prior to Visit  Medication Sig Dispense Refill   Accu-Chek FastClix Lancets MISC Use as directed once a day 102 each 3   ACCU-CHEK GUIDE test strip Use as instructed once a day 100 each 3   amLODipine  (NORVASC ) 5 MG tablet TAKE 1 TABLET(5 MG) BY MOUTH DAILY 90 tablet 1   benazepril  (LOTENSIN ) 40 MG tablet TAKE 1 TABLET(40 MG) BY MOUTH DAILY 90 tablet 1   Blood Glucose Monitoring Suppl (ACCU-CHEK GUIDE ME) w/Device KIT 1 each by Does not apply route as directed. 1 kit 0   hydrochlorothiazide  (HYDRODIURIL ) 25 MG tablet TAKE 1 TABLET(25 MG) BY MOUTH DAILY 90 tablet 1   Multiple Vitamins-Minerals (MULTIVITAMIN WITH MINERALS) tablet Take 1 tablet by mouth daily.     rosuvastatin  (CRESTOR ) 20 MG tablet TAKE 1 TABLET(20 MG) BY MOUTH DAILY 90 tablet 1   No current facility-administered medications on file prior to visit.    No Known Allergies     Physical Exam Vitals requested from patient and listed below if patient had equipment and was able to obtain at home for this virtual visit: There were no vitals filed for this visit. Estimated body mass  index is 31.89 kg/m as calculated from the following:   Height as of 05/05/23: 5' 3 (1.6 m).   Weight as of this encounter: 180 lb (81.6 kg).  EKG (optional): deferred due to virtual visit  GENERAL: alert, oriented, no acute distress detected, full vision exam deferred due to pandemic and/or virtual encounter  HEENT: atraumatic, conjunttiva clear, no obvious abnormalities on inspection of external nose and ears  NECK: normal movements of the head and neck  LUNGS: on inspection no signs of respiratory distress,  breathing rate appears normal, no obvious gross SOB, gasping or wheezing  CV: no obvious cyanosis  MS: moves all visible extremities without noticeable abnormality  PSYCH/NEURO: pleasant and cooperative, no obvious depression or anxiety, speech and thought processing grossly intact, Cognitive function grossly intact  Flowsheet Row Clinical Support from 02/26/2024 in [facility]  PHQ-9 Total Score 0        02/26/2024   10:05 AM 05/05/2023   10:26 AM 07/09/2021   12:08 PM 06/30/2020    9:01 AM 06/28/2019   12:33 PM  Depression screen PHQ 2/9  Decreased Interest 0 0 0 0 0  Down, Depressed, Hopeless 0 0 0 0 0  PHQ - 2 Score 0 0 0 0 0  Altered sleeping 0 0 1    Tired, decreased energy 0 0 0    Change in appetite 0 0 0    Feeling bad or failure about yourself  0 0 0    Trouble concentrating 0 0 0    Moving slowly or fidgety/restless 0 0 0    Suicidal thoughts 0 0 0    PHQ-9 Score 0 0 1    Difficult doing work/chores  Not difficult at all          02/18/2022    8:08 AM 04/25/2022   12:50 PM 05/05/2023   10:25 AM 02/25/2024   10:46 AM 02/26/2024   10:05 AM  Fall Risk  Falls in the past year? 0 0 0 0 0  Was there an injury with Fall? 0 0 0 0 0  Fall Risk Category Calculator 0 0 0 0  0  Fall Risk Category (Retired) Low  Low      (RETIRED) Patient Fall Risk Level Low fall risk  Low fall risk      Patient at Risk for Falls Due to No Fall  Risks      Fall risk Follow up Falls evaluation completed   Falls evaluation completed  Falls evaluation completed     Patient-reported   Data saved with a previous flowsheet row definition     SUMMARY AND PLAN:  Encounter for Medicare annual wellness exam  Discussed applicable health maintenance/preventive health measures and advised and referred or ordered per patient preferences: -discussed vaccines due recs and risks - she gets with her old employer and agrees to provide proof of receipt once done so can update her record.  -had dexa 05/2022, normal, plan to repeat in next few years -had negative cologuard recently -she plans to get labs at her next office visit - says will be in the next few months.  Health Maintenance  Topic Date Due   Diabetic kidney evaluation - Urine ACR  06/30/2021   COVID-19 Vaccine (4 - 2024-25 season) 03/23/2023   HEMOGLOBIN A1C  11/10/2023   INFLUENZA VACCINE  02/20/2024   OPHTHALMOLOGY EXAM  04/28/2024   FOOT EXAM  05/04/2024   Diabetic kidney evaluation - eGFR measurement  05/11/2024   Medicare Annual Wellness (AWV)  02/25/2025   MAMMOGRAM  08/17/2025   Fecal DNA (Cologuard)  02/10/2027   DTaP/Tdap/Td (4 - Td or Tdap) 06/04/2032   Pneumococcal Vaccine: 50+ Years  Completed   DEXA SCAN  Completed   Hepatitis C Screening  Completed   Zoster Vaccines- Shingrix   Completed   Hepatitis B Vaccines  Aged Out   HPV VACCINES  Aged Out   Meningococcal B Vaccine  Aged [employer] and counseling  on the following was provided based on the above review of health and a plan/checklist for the patient, along with additional information discussed, was provided for the patient in the patient instructions :   -Advised and counseled on a healthy lifestyle - including the importance of a healthy diet, regular physical activity, etc - she is doing great and feels great -Reviewed patient's current diet. Advised and counseled on a whole foods based healthy  diet. Encouraged to continue to make healthy choices. A summary of a healthy diet was provided in the Patient Instructions.  -reviewed patient's current physical activity level and discussed exercise guidelines for adults. Congratulated on healthy choices and encouraged to continue.  -Advise yearly dental visits at minimum and regular eye exams -Advised and counseled on alcohol safe limits, risks - advised to limit to 1 drink per 24 hours  Follow up: see patient instructions     Patient Instructions  I really enjoyed getting to talk with you today! I am available on Tuesdays and Thursdays for virtual visits if you have any questions or concerns, or if I can be of any further assistance.   CHECKLIST FROM ANNUAL WELLNESS VISIT:  -Follow up (please call to schedule if not scheduled after visit):   -yearly for annual wellness visit with primary care office  Here is a list of your preventive care/health maintenance measures and the plan for each if any are due:  PLAN For any measures below that may be due:    1. Can get flu and covid vaccines at the pharmacy - please provide proof of receipt when you do so that we can update your record.   2. Please get diabetic kidney exam and [id number] at your visit.    Health Maintenance  Topic Date Due   Diabetic kidney evaluation - Urine ACR  06/30/2021   COVID-19 Vaccine (4 - 2024-25 season) 03/23/2023   HEMOGLOBIN A1C  11/10/2023   INFLUENZA VACCINE  02/20/2024   OPHTHALMOLOGY EXAM  04/28/2024   FOOT EXAM  05/04/2024   Diabetic kidney evaluation - eGFR measurement  05/11/2024   Medicare Annual Wellness (AWV)  02/25/2025   MAMMOGRAM  08/17/2025   Fecal DNA (Cologuard)  02/10/2027   DTaP/Tdap/Td (4 - Td or Tdap) 06/04/2032   Pneumococcal Vaccine: 50+ Years  Completed   DEXA SCAN  Completed   Hepatitis C Screening  Completed   Zoster Vaccines- Shingrix   Completed   Hepatitis B Vaccines  Aged Out   HPV VACCINES  Aged Out   Meningococcal B  Vaccine  Aged Out    -See a dentist at least yearly  -Get your eyes checked and then per your eye specialist's recommendations  -Other issues addressed today:   1. Please limit alcohol to no more than 1 drink per 24 hours.    -I have included below further information regarding a healthy whole foods based diet, physical activity guidelines for adults, stress management and opportunities for social connections. I hope you find this information useful.   -----------------------------------------------------------------------------------------------------------------------------------------------------------------------------------------------------------------------------------------------------------    NUTRITION: -eat real food: lots of colorful vegetables (half the plate) and fruits -5-7 servings of vegetables and fruits per day (fresh or steamed is best), exp. 2 servings of vegetables with lunch and dinner and 2 servings of fruit per day. Berries and greens such as kale and collards are great choices.  -consume on a regular basis:  fresh fruits, fresh veggies, fish, nuts, seeds, healthy oils (such as olive oil, avocado oil), whole grains (make sure  for bread/pasta/crackers/etc., that the first ingredient on label contains the word whole), legumes. -can eat small amounts of dairy and lean meat (no larger than the palm of your hand), but avoid processed meats such as ham, bacon, lunch meat, etc. -drink water -try to avoid fast food and pre-packaged foods, processed meat, ultra processed foods/beverages (donuts, candy, etc.) -most experts advise limiting sodium to < 2300mg  per day, should limit further is any chronic conditions such as high blood pressure, heart disease, diabetes, etc. The American Heart Association advised that < 1500mg  is is ideal -try to avoid foods/beverages that contain any ingredients with names you do not recognize  -try to avoid foods/beverages  with added sugar  or sweeteners/sweets  -try to avoid sweet drinks (including diet drinks): soda, juice, Gatorade, sweet tea, power drinks, diet drinks -try to avoid white rice, white bread, pasta (unless whole grain)  EXERCISE GUIDELINES FOR ADULTS: -if you wish to increase your physical activity, do so gradually and with the approval of your doctor -STOP and seek medical care immediately if you have any chest pain, chest discomfort or trouble breathing when starting or increasing exercise  -move and stretch your body, legs, feet and arms when sitting for long periods -Physical activity guidelines for optimal health in adults: -get at least 150 minutes per week of moderate exercise (can talk, but not sing); this is about 20-30 minutes of sustained activity 5-7 days per week or two 10-15 minute episodes of sustained activity 5-7 days per week -do some muscle building/resistance training/strength training at least 2 days per week  -balance exercises 3+ days per week:   Stand somewhere where you have something sturdy to hold onto if you lose balance    1) lift up on toes, then back down, start with 5x per day and work up to 20x   2) stand and lift one leg straight out to the side so that foot is a few inches of the floor, start with 5x each side and work up to 20x each side   3) stand on one foot, start with 5 seconds each side and work up to 20 seconds on each side  If you need ideas or help with getting more active:  -Silver sneakers https://tools.silversneakers.com  -Walk with a Doc: http://www.duncan-williams.com/  -try to include resistance (weight lifting/strength building) and balance exercises twice per week: or the following link for ideas: http://castillo-powell.com/  BuyDucts.dk  STRESS MANAGEMENT: -can try meditating, or just sitting quietly with deep breathing while intentionally relaxing all parts of your body  for 5 minutes daily -if you need further help with stress, anxiety or depression please follow up with your primary doctor or contact the wonderful folks at [facility]: [phone]  SOCIAL CONNECTIONS: -options in [place] if you wish to engage in more social and exercise related activities:  -Silver sneakers https://tools.silversneakers.com  -Walk with a Doc: http://www.duncan-williams.com/  -Check out the [facility] Active Adults 50+ section on the [employer] [place] [employer] (hiking clubs, book clubs, cards and games, chess, exercise classes, aquatic classes and much more) - see the website for details: https://www.[place].gov/departments/parks-recreation/active-adults50  -YouTube has lots of exercise videos for different ages and abilities as well  -[facility] (a variety of indoor and outdoor inperson activities for adults). 602-060-4139. [address]  -Virtual Online Classes (a variety of topics): see seniorplanet.org or call [phone]  -consider volunteering at a school, hospice center, church, senior center or elsewhere  Chiquita JONELLE Cramp, DO

## 2024-02-26 NOTE — Patient Instructions (Signed)
 I really enjoyed getting to talk with you today! I am available on Tuesdays and Thursdays for virtual visits if you have any questions or concerns, or if I can be of any further assistance.   CHECKLIST FROM ANNUAL WELLNESS VISIT:  -Follow up (please call to schedule if not scheduled after visit):   -yearly for annual wellness visit with primary care office  Here is a list of your preventive care/health maintenance measures and the plan for each if any are due:  PLAN For any measures below that may be due:    1. Can get flu and covid vaccines at the pharmacy - please provide proof of receipt when you do so that we can update your record.   2. Please get diabetic kidney exam and hgba1c at your visit.    Health Maintenance  Topic Date Due   Diabetic kidney evaluation - Urine ACR  06/30/2021   COVID-19 Vaccine (4 - 2024-25 season) 03/23/2023   HEMOGLOBIN A1C  11/10/2023   INFLUENZA VACCINE  02/20/2024   OPHTHALMOLOGY EXAM  04/28/2024   FOOT EXAM  05/04/2024   Diabetic kidney evaluation - eGFR measurement  05/11/2024   Medicare Annual Wellness (AWV)  02/25/2025   MAMMOGRAM  08/17/2025   Fecal DNA (Cologuard)  02/10/2027   DTaP/Tdap/Td (4 - Td or Tdap) 06/04/2032   Pneumococcal Vaccine: 50+ Years  Completed   DEXA SCAN  Completed   Hepatitis C Screening  Completed   Zoster Vaccines- Shingrix   Completed   Hepatitis B Vaccines  Aged Out   HPV VACCINES  Aged Out   Meningococcal B Vaccine  Aged Out    -See a dentist at least yearly  -Get your eyes checked and then per your eye specialist's recommendations  -Other issues addressed today:   1. Please limit alcohol to no more than 1 drink per 24 hours.    -I have included below further information regarding a healthy whole foods based diet, physical activity guidelines for adults, stress management and opportunities for social connections. I hope you find this information useful.    -----------------------------------------------------------------------------------------------------------------------------------------------------------------------------------------------------------------------------------------------------------    NUTRITION: -eat real food: lots of colorful vegetables (half the plate) and fruits -5-7 servings of vegetables and fruits per day (fresh or steamed is best), exp. 2 servings of vegetables with lunch and dinner and 2 servings of fruit per day. Berries and greens such as kale and collards are great choices.  -consume on a regular basis:  fresh fruits, fresh veggies, fish, nuts, seeds, healthy oils (such as olive oil, avocado oil), whole grains (make sure for bread/pasta/crackers/etc., that the first ingredient on label contains the word whole), legumes. -can eat small amounts of dairy and lean meat (no larger than the palm of your hand), but avoid processed meats such as ham, bacon, lunch meat, etc. -drink water -try to avoid fast food and pre-packaged foods, processed meat, ultra processed foods/beverages (donuts, candy, etc.) -most experts advise limiting sodium to < 2300mg  per day, should limit further is any chronic conditions such as high blood pressure, heart disease, diabetes, etc. The American Heart Association advised that < 1500mg  is is ideal -try to avoid foods/beverages that contain any ingredients with names you do not recognize  -try to avoid foods/beverages  with added sugar or sweeteners/sweets  -try to avoid sweet drinks (including diet drinks): soda, juice, Gatorade, sweet tea, power drinks, diet drinks -try to avoid white rice, white bread, pasta (unless whole grain)  EXERCISE GUIDELINES FOR ADULTS: -if you wish to  increase your physical activity, do so gradually and with the approval of your doctor -STOP and seek medical care immediately if you have any chest pain, chest discomfort or trouble breathing when starting or  increasing exercise  -move and stretch your body, legs, feet and arms when sitting for long periods -Physical activity guidelines for optimal health in adults: -get at least 150 minutes per week of moderate exercise (can talk, but not sing); this is about 20-30 minutes of sustained activity 5-7 days per week or two 10-15 minute episodes of sustained activity 5-7 days per week -do some muscle building/resistance training/strength training at least 2 days per week  -balance exercises 3+ days per week:   Stand somewhere where you have something sturdy to hold onto if you lose balance    1) lift up on toes, then back down, start with 5x per day and work up to 20x   2) stand and lift one leg straight out to the side so that foot is a few inches of the floor, start with 5x each side and work up to 20x each side   3) stand on one foot, start with 5 seconds each side and work up to 20 seconds on each side  If you need ideas or help with getting more active:  -Silver sneakers https://tools.silversneakers.com  -Walk with a Doc: http://www.duncan-williams.com/  -try to include resistance (weight lifting/strength building) and balance exercises twice per week: or the following link for ideas: http://castillo-powell.com/  BuyDucts.dk  STRESS MANAGEMENT: -can try meditating, or just sitting quietly with deep breathing while intentionally relaxing all parts of your body for 5 minutes daily -if you need further help with stress, anxiety or depression please follow up with your primary doctor or contact the wonderful folks at [facility]: [phone]  SOCIAL CONNECTIONS: -options in [place] if you wish to engage in more social and exercise related activities:  -Silver sneakers https://tools.silversneakers.com  -Walk with a Doc: http://www.duncan-williams.com/  -Check out the [facility] Active Adults 50+  section on the [place] [employer] (hiking clubs, book clubs, cards and games, chess, exercise classes, aquatic classes and much more) - see the website for details: https://www.[place].gov/departments/parks-recreation/active-adults50  -YouTube has lots of exercise videos for different ages and abilities as well  -[facility] (a variety of indoor and outdoor inperson activities for adults). [phone]. [address]  -Virtual Online Classes (a variety of topics): see seniorplanet.org or call [phone]  -consider volunteering at a school, hospice center, church, senior center or elsewhere

## 2024-03-20 ENCOUNTER — Other Ambulatory Visit: Payer: Self-pay | Admitting: Internal Medicine

## 2024-05-24 ENCOUNTER — Encounter: Payer: Self-pay | Admitting: Internal Medicine

## 2024-05-25 ENCOUNTER — Ambulatory Visit: Admitting: Internal Medicine

## 2024-05-25 ENCOUNTER — Encounter: Payer: Self-pay | Admitting: Internal Medicine

## 2024-05-25 VITALS — BP 120/84 | HR 70 | Temp 97.8°F | Wt 180.4 lb

## 2024-05-25 DIAGNOSIS — N3001 Acute cystitis with hematuria: Secondary | ICD-10-CM

## 2024-05-25 LAB — URINALYSIS, ROUTINE W REFLEX MICROSCOPIC
Bilirubin Urine: NEGATIVE
Ketones, ur: NEGATIVE
Nitrite: NEGATIVE
Specific Gravity, Urine: 1.015 (ref 1.000–1.030)
Total Protein, Urine: NEGATIVE
Urine Glucose: NEGATIVE
Urobilinogen, UA: 0.2 (ref 0.0–1.0)
pH: 7.5 (ref 5.0–8.0)

## 2024-05-25 LAB — POC URINALSYSI DIPSTICK (AUTOMATED)
Bilirubin, UA: NEGATIVE
Glucose, UA: NEGATIVE
Ketones, UA: NEGATIVE
Nitrite, UA: NEGATIVE
Protein, UA: POSITIVE — AB
Spec Grav, UA: 1.01 (ref 1.010–1.025)
Urobilinogen, UA: 0.2 U/dL
pH, UA: 7 (ref 5.0–8.0)

## 2024-05-25 MED ORDER — SULFAMETHOXAZOLE-TRIMETHOPRIM 800-160 MG PO TABS: 1.0000 | ORAL_TABLET | Freq: Two times a day (BID) | ORAL | 0 refills | Status: AC

## 2024-05-25 NOTE — Addendum Note (Signed)
 Addended by: KATHRYNE MILLMAN B on: 05/25/2024 12:01 PM   Modules accepted: Orders

## 2024-05-25 NOTE — Progress Notes (Signed)
     Established Patient Office Visit     CC/Reason for Visit: UTI symptoms  HPI: Erica Sutton is a 68 y.o. female who is coming in today for the above mentioned reasons.  For the past 4 days has been having increased urinary frequency, dysuria, sensation of incomplete bladder emptying.   Past Medical/Surgical History: Past Medical History:  Diagnosis Date   Anemia    intermittent her whole life, reports she had eval and was told to take MV   Eclampsia    GERD (gastroesophageal reflux disease) 08/03/2015   resolved 2016   Hyperglycemia 02/19/2013   Hyperlipidemia 02/19/2013   Hypertension    Obesity 08/03/2015    Past Surgical History:  Procedure Laterality Date   ABDOMINAL HYSTERECTOMY     heavy menses. no concerns for cancer.    CHOLECYSTECTOMY  11/10/12   lap chole with IOC   shoulder shoulder Bilateral    rotator cuff bilat; right shoulder also frozen.     Social History:  reports that she has quit smoking. She has never used smokeless tobacco. She reports current alcohol use of about 3.0 standard drinks of alcohol per week. She reports that she does not use drugs.  Allergies: No Known Allergies  Family History:  Family History  Problem Relation Age of Onset   Diabetes Mother    Liver disease Father    Alcohol abuse Father    Healthy Brother    Stroke Sister 69     Current Outpatient Medications:    Accu-Chek FastClix Lancets MISC, Use as directed once a day, Disp: 102 each, Rfl: 3   ACCU-CHEK GUIDE test strip, Use as instructed once a day, Disp: 100 each, Rfl: 3   amLODipine  (NORVASC ) 5 MG tablet, TAKE 1 TABLET(5 MG) BY MOUTH DAILY, Disp: 90 tablet, Rfl: 1   benazepril  (LOTENSIN ) 40 MG tablet, TAKE 1 TABLET(40 MG) BY MOUTH DAILY, Disp: 90 tablet, Rfl: 1   Blood Glucose Monitoring Suppl (ACCU-CHEK GUIDE ME) w/Device KIT, 1 each by Does not apply route as directed., Disp: 1 kit, Rfl: 0   hydrochlorothiazide  (HYDRODIURIL ) 25 MG tablet, TAKE 1 TABLET(25 MG) BY  MOUTH DAILY, Disp: 90 tablet, Rfl: 1   Multiple Vitamins-Minerals (MULTIVITAMIN WITH MINERALS) tablet, Take 1 tablet by mouth daily., Disp: , Rfl:    rosuvastatin  (CRESTOR ) 20 MG tablet, TAKE 1 TABLET(20 MG) BY MOUTH DAILY, Disp: 90 tablet, Rfl: 1   sulfamethoxazole -trimethoprim  (BACTRIM  DS) 800-160 MG tablet, Take 1 tablet by mouth 2 (two) times daily for 7 days., Disp: 14 tablet, Rfl: 0  Review of Systems:  Negative unless indicated in HPI.   Physical Exam: Vitals:   05/25/24 1117  BP: 120/84  Pulse: 70  Temp: 97.8 F (36.6 C)  TempSrc: Oral  SpO2: 98%  Weight: 180 lb 6.4 oz (81.8 kg)    Body mass index is 31.96 kg/m.    Impression and Plan:  Acute cystitis with hematuria -     POCT Urinalysis Dipstick (Automated) -     Sulfamethoxazole -Trimethoprim ; Take 1 tablet by mouth 2 (two) times daily for 7 days.  Dispense: 14 tablet; Refill: 0   - In office urine dipstick with blood and leukocytes.  Sent for culture, treat with Bactrim  DS for 7 days.  Time spent:30 minutes reviewing chart, interviewing and examining patient and formulating plan of care.     Tully Theophilus Andrews, MD [facility] at [facility]

## 2024-05-26 ENCOUNTER — Ambulatory Visit: Admitting: Internal Medicine

## 2024-05-26 VITALS — BP 110/80 | HR 69 | Temp 97.7°F | Ht 62.0 in | Wt 182.9 lb

## 2024-05-26 DIAGNOSIS — Z780 Asymptomatic menopausal state: Secondary | ICD-10-CM | POA: Diagnosis not present

## 2024-05-26 DIAGNOSIS — Z0000 Encounter for general adult medical examination without abnormal findings: Secondary | ICD-10-CM | POA: Diagnosis not present

## 2024-05-26 DIAGNOSIS — E559 Vitamin D deficiency, unspecified: Secondary | ICD-10-CM | POA: Diagnosis not present

## 2024-05-26 DIAGNOSIS — E1169 Type 2 diabetes mellitus with other specified complication: Secondary | ICD-10-CM | POA: Diagnosis not present

## 2024-05-26 DIAGNOSIS — E785 Hyperlipidemia, unspecified: Secondary | ICD-10-CM

## 2024-05-26 DIAGNOSIS — E119 Type 2 diabetes mellitus without complications: Secondary | ICD-10-CM

## 2024-05-26 DIAGNOSIS — I10 Essential (primary) hypertension: Secondary | ICD-10-CM

## 2024-05-26 NOTE — Progress Notes (Signed)
 Established Patient Office Visit     CC/Reason for Visit: Annual preventive exam and subsequent Medicare wellness visit  HPI: Erica Sutton is a 68 y.o. female who is coming in today for the above mentioned reasons. Past Medical History is significant for: Hypertension, hyperlipidemia, type 2 diabetes, obesity.  She is feeling well.  No concerns or complaints.  Has routine eye and dental care.  Is due for COVID-vaccine, all cancer screening is up-to-date.  Is due for DEXA.   Past Medical/Surgical History: Past Medical History:  Diagnosis Date   Anemia    intermittent her whole life, reports she had eval and was told to take MV   Eclampsia    GERD (gastroesophageal reflux disease) 08/03/2015   resolved 2016   Hyperglycemia 02/19/2013   Hyperlipidemia 02/19/2013   Hypertension    Obesity 08/03/2015    Past Surgical History:  Procedure Laterality Date   ABDOMINAL HYSTERECTOMY     heavy menses. no concerns for cancer.    CHOLECYSTECTOMY  11/10/12   lap chole with IOC   shoulder shoulder Bilateral    rotator cuff bilat; right shoulder also frozen.     Social History:  reports that she has quit smoking. She has never used smokeless tobacco. She reports current alcohol use of about 3.0 standard drinks of alcohol per week. She reports that she does not use drugs.  Allergies: No Known Allergies  Family History:  Family History  Problem Relation Age of Onset   Diabetes Mother    Liver disease Father    Alcohol abuse Father    Healthy Brother    Stroke Sister 66     Current Outpatient Medications:    Accu-Chek FastClix Lancets MISC, Use as directed once a day, Disp: 102 each, Rfl: 3   ACCU-CHEK GUIDE test strip, Use as instructed once a day, Disp: 100 each, Rfl: 3   amLODipine  (NORVASC ) 5 MG tablet, TAKE 1 TABLET(5 MG) BY MOUTH DAILY, Disp: 90 tablet, Rfl: 1   benazepril  (LOTENSIN ) 40 MG tablet, TAKE 1 TABLET(40 MG) BY MOUTH DAILY, Disp: 90 tablet, Rfl: 1   Blood  Glucose Monitoring Suppl (ACCU-CHEK GUIDE ME) w/Device KIT, 1 each by Does not apply route as directed., Disp: 1 kit, Rfl: 0   hydrochlorothiazide  (HYDRODIURIL ) 25 MG tablet, TAKE 1 TABLET(25 MG) BY MOUTH DAILY, Disp: 90 tablet, Rfl: 1   Multiple Vitamins-Minerals (MULTIVITAMIN WITH MINERALS) tablet, Take 1 tablet by mouth daily., Disp: , Rfl:    rosuvastatin  (CRESTOR ) 20 MG tablet, TAKE 1 TABLET(20 MG) BY MOUTH DAILY, Disp: 90 tablet, Rfl: 1   sulfamethoxazole -trimethoprim  (BACTRIM  DS) 800-160 MG tablet, Take 1 tablet by mouth 2 (two) times daily for 7 days., Disp: 14 tablet, Rfl: 0  Review of Systems:  Negative unless indicated in HPI.   Physical Exam: Vitals:   05/26/24 1551  BP: 110/80  Pulse: 69  Temp: 97.7 F (36.5 C)  TempSrc: Oral  SpO2: 97%  Weight: 182 lb 14.4 oz (83 kg)  Height: 5' 2 (1.575 m)    Body mass index is 33.45 kg/m.   Physical Exam Vitals reviewed.  Constitutional:      General: She is not in acute distress.    Appearance: Normal appearance. She is not ill-appearing, toxic-appearing or diaphoretic.  HENT:     Head: Normocephalic.     Right Ear: Tympanic membrane, ear canal and external ear normal. There is no impacted cerumen.     Left Ear: Tympanic membrane, ear  canal and external ear normal. There is no impacted cerumen.     Nose: Nose normal.     Mouth/Throat:     Mouth: Mucous membranes are moist.     Pharynx: Oropharynx is clear. No oropharyngeal exudate or posterior oropharyngeal erythema.  Eyes:     General: No scleral icterus.       Right eye: No discharge.        Left eye: No discharge.     Conjunctiva/sclera: Conjunctivae normal.     Pupils: Pupils are equal, round, and reactive to light.  Neck:     Vascular: No carotid bruit.  Cardiovascular:     Rate and Rhythm: Normal rate and regular rhythm.     Pulses: Normal pulses.     Heart sounds: Normal heart sounds.  Pulmonary:     Effort: Pulmonary effort is normal. No respiratory  distress.     Breath sounds: Normal breath sounds.  Abdominal:     General: Abdomen is flat. Bowel sounds are normal.     Palpations: Abdomen is soft.  Musculoskeletal:        General: Normal range of motion.     Cervical back: Normal range of motion.  Skin:    General: Skin is warm and dry.  Neurological:     General: No focal deficit present.     Mental Status: She is alert and oriented to person, place, and time. Mental status is at baseline.  Psychiatric:        Mood and Affect: Mood normal.        Behavior: Behavior normal.        Thought Content: Thought content normal.        Judgment: Judgment normal.    Subsequent Medicare wellness visit   1. Risk factors, based on past  M,S,F -  history of hypertension, hyperlipidemia, diabetes and obesity   2.  Physical activities: Dietary issues and exercise activities discussed:      3.  Depression/mood:  Flowsheet Row Clinical Support from 02/26/2024 in [facility] Total Score 0     4.  ADL's:    05/26/2024    6:52 AM 02/25/2024   10:46 AM  In your present state of health, do you have any difficulty performing the following activities:  Hearing? 0 0  Vision? 0 0  Difficulty concentrating or making decisions? 0 0  Walking or climbing stairs? 0 0  Dressing or bathing? 0 0  Doing errands, shopping? 0 0  Preparing Food and eating ? N N  Using the Toilet? N N  In the past six months, have you accidently leaked urine? N N  Do you have problems with loss of bowel control? N N  Managing your Medications? N N  Managing your Finances? N N  Housekeeping or managing your Housekeeping? N N     5.  Fall risk:     05/05/2023   10:25 AM 02/25/2024   10:46 AM 02/26/2024   10:05 AM 05/26/2024    6:52 AM 05/26/2024    3:42 PM  Fall Risk  Falls in the past year? 0 0 0 0 0  Was there an injury with Fall? 0 0 0 0 0  Fall Risk Category Calculator 0 0  0 0  0  Fall risk Follow up Falls evaluation  completed  Falls evaluation completed  Falls evaluation completed     Patient-reported     6.  Home safety: No  problems identified   7.  Height weight, and visual acuity: height and weight as above, vision/hearing: Vision Screening   Right eye Left eye Both eyes  Without correction     With correction 20/20 20/20 20/20      8.  Counseling: Counseling given: Not Answered Tobacco comments: quit in her 20s, light smoker    9. Lab orders based on risk factors: Laboratory update will be reviewed   10. Cognitive assessment:        05/26/2024    3:49 PM  6CIT Screen  What Year? 0 points  What month? 0 points  What time? 0 points  Count back from 20 0 points  Months in reverse 0 points  Repeat phrase 0 points  Total Score 0 points     11. Screening: Patient provided with a written and personalized 5-10 year screening schedule in the AVS. Health Maintenance  Topic Date Due   Yearly kidney health urinalysis for diabetes  06/30/2021   Hemoglobin A1C  11/10/2023   COVID-19 Vaccine (4 - 2025-26 season) 03/22/2024   Yearly kidney function blood test for diabetes  05/11/2024   Eye exam for diabetics  01/26/2025   Medicare Annual Wellness Visit  02/25/2025   Complete foot exam   05/26/2025   Breast Cancer Screening  08/17/2025   Cologuard (Stool DNA test)  02/10/2027   DTaP/Tdap/Td vaccine (4 - Td or Tdap) 06/04/2032   Pneumococcal Vaccine for age over 25  Completed   Flu Shot  Completed   DEXA scan (bone density measurement)  Completed   Hepatitis C Screening  Completed   Zoster (Shingles) Vaccine  Completed   Meningitis B Vaccine  Aged Out    12. Provider List Update: Patient Care Team    Relationship Specialty Notifications Start End  Theophilus Andrews, Tully GRADE, MD PCP - General Internal Medicine  08/14/22   Watt Hun, NP  Nurse Practitioner  04/07/19   Leigh Venetia CROME, MD Consulting Physician Neurology  04/25/22      13. Advance Directives: Does Patient Have a  Medical Advance Directive?: Yes Type of Advance Directive: Healthcare Power of Attorney, Living will Does patient want to make changes to medical advance directive?: No - Patient declined Copy of Healthcare Power of Attorney in Chart?: No - copy requested  14. Opioids: Patient is not on any opioid prescriptions and has no risk factors for a substance use disorder.   15.   Goals      start a podcast         I have personally reviewed and noted the following in the patient's chart:   Medical and social history Use of alcohol, tobacco or illicit drugs  Current medications and supplements Functional ability and status Nutritional status Physical activity Advanced directives List of other physicians Hospitalizations, surgeries, and ER visits in previous 12 months Vitals Screenings to include cognitive, depression, and falls Referrals and appointments  In addition, I have reviewed and discussed with patient certain preventive protocols, quality metrics, and best practice recommendations. A written personalized care plan for preventive services as well as general preventive health recommendations were provided to patient.   Impression and Plan:  Encounter for Medicare annual wellness exam  Vitamin D  deficiency -     Vitamin B12; Future -     VITAMIN D  25 Hydroxy (Vit-D Deficiency, Fractures); Future  Hyperlipidemia associated with type 2 diabetes mellitus (HCC) -     Lipid panel; Future  Type 2 diabetes mellitus without complication,  without long-term current use of insulin (HCC) -     Hemoglobin A1c; Future -     CBC with Differential/Platelet; Future -     Comprehensive metabolic panel with GFR; Future -     TSH; Future -     Microalbumin / creatinine urine ratio; Future  Essential hypertension  Postmenopausal estrogen deficiency -     DG Bone Density; Future   -Recommend routine eye and dental care. -Healthy lifestyle discussed in detail. -Labs to be updated  today. -Prostate cancer screening: N/A Health Maintenance  Topic Date Due   Yearly kidney health urinalysis for diabetes  06/30/2021   Hemoglobin A1C  11/10/2023   COVID-19 Vaccine (4 - 2025-26 season) 03/22/2024   Yearly kidney function blood test for diabetes  05/11/2024   Eye exam for diabetics  01/26/2025   Medicare Annual Wellness Visit  02/25/2025   Complete foot exam   05/26/2025   Breast Cancer Screening  08/17/2025   Cologuard (Stool DNA test)  02/10/2027   DTaP/Tdap/Td vaccine (4 - Td or Tdap) 06/04/2032   Pneumococcal Vaccine for age over 41  Completed   Flu Shot  Completed   DEXA scan (bone density measurement)  Completed   Hepatitis C Screening  Completed   Zoster (Shingles) Vaccine  Completed   Meningitis B Vaccine  Aged Out    - Advised to update COVID-vaccine at pharmacy.    Tully Theophilus Andrews, MD [facility] at [facility]

## 2024-05-27 ENCOUNTER — Ambulatory Visit: Payer: Self-pay | Admitting: Internal Medicine

## 2024-05-27 DIAGNOSIS — E559 Vitamin D deficiency, unspecified: Secondary | ICD-10-CM

## 2024-05-27 DIAGNOSIS — E1129 Type 2 diabetes mellitus with other diabetic kidney complication: Secondary | ICD-10-CM

## 2024-05-27 LAB — COMPREHENSIVE METABOLIC PANEL WITH GFR
ALT: 23 U/L (ref 0–35)
AST: 18 U/L (ref 0–37)
Albumin: 4.1 g/dL (ref 3.5–5.2)
Alkaline Phosphatase: 58 U/L (ref 39–117)
BUN: 26 mg/dL — ABNORMAL HIGH (ref 6–23)
CO2: 28 meq/L (ref 19–32)
Calcium: 9.3 mg/dL (ref 8.4–10.5)
Chloride: 100 meq/L (ref 96–112)
Creatinine, Ser: 1.24 mg/dL — ABNORMAL HIGH (ref 0.40–1.20)
GFR: 45.02 mL/min — ABNORMAL LOW (ref >60.00)
Glucose, Bld: 103 mg/dL — ABNORMAL HIGH (ref 70–99)
Potassium: 4 meq/L (ref 3.5–5.1)
Sodium: 136 meq/L (ref 135–145)
Total Bilirubin: 0.6 mg/dL (ref 0.2–1.2)
Total Protein: 6.9 g/dL (ref 6.0–8.3)

## 2024-05-27 LAB — CBC WITH DIFFERENTIAL/PLATELET
Basophils Absolute: 0.1 K/uL (ref 0.0–0.1)
Basophils Relative: 1.7 % (ref 0.0–3.0)
Eosinophils Absolute: 0.1 K/uL (ref 0.0–0.7)
Eosinophils Relative: 1.5 % (ref 0.0–5.0)
HCT: 36.4 % (ref 36.0–46.0)
Hemoglobin: 12.2 g/dL (ref 12.0–15.0)
Lymphocytes Relative: 35.5 % (ref 12.0–46.0)
Lymphs Abs: 2.7 K/uL (ref 0.7–4.0)
MCHC: 33.6 g/dL (ref 30.0–36.0)
MCV: 95.5 fl (ref 78.0–100.0)
Monocytes Absolute: 0.5 K/uL (ref 0.1–1.0)
Monocytes Relative: 6.5 % (ref 3.0–12.0)
Neutro Abs: 4.1 K/uL (ref 1.4–7.7)
Neutrophils Relative %: 54.8 % (ref 43.0–77.0)
Platelets: 321 K/uL (ref 150.0–400.0)
RBC: 3.81 Mil/uL — ABNORMAL LOW (ref 3.87–5.11)
RDW: 12.9 % (ref 11.5–15.5)
WBC: 7.6 K/uL (ref 4.0–10.5)

## 2024-05-27 LAB — TSH: TSH: 0.72 u[IU]/mL (ref 0.35–5.50)

## 2024-05-27 LAB — LIPID PANEL
Cholesterol: 169 mg/dL (ref 0–200)
HDL: 53.7 mg/dL (ref >39.00)
LDL Cholesterol: 74 mg/dL (ref 0–99)
NonHDL: 115.46
Total CHOL/HDL Ratio: 3
Triglycerides: 205 mg/dL — ABNORMAL HIGH (ref 0.0–149.0)
VLDL: 41 mg/dL — ABNORMAL HIGH (ref 0.0–40.0)

## 2024-05-27 LAB — MICROALBUMIN / CREATININE URINE RATIO
Creatinine,U: 62.2 mg/dL
Microalb Creat Ratio: 153.5 mg/g — ABNORMAL HIGH (ref 0.0–30.0)
Microalb, Ur: 9.5 mg/dL — ABNORMAL HIGH (ref 0.0–1.9)

## 2024-05-27 LAB — URINE CULTURE
MICRO NUMBER:: 17188276
SPECIMEN QUALITY:: ADEQUATE

## 2024-05-27 LAB — HEMOGLOBIN A1C: Hgb A1c MFr Bld: 7.1 % — ABNORMAL HIGH (ref 4.6–6.5)

## 2024-05-27 LAB — VITAMIN B12: Vitamin B-12: 436 pg/mL (ref 211–911)

## 2024-05-27 LAB — VITAMIN D 25 HYDROXY (VIT D DEFICIENCY, FRACTURES): VITD: 29.17 ng/mL — ABNORMAL LOW (ref 30.00–100.00)

## 2024-05-27 MED ORDER — VITAMIN D (ERGOCALCIFEROL) 1.25 MG (50000 UNIT) PO CAPS: 50000.0000 [IU] | ORAL_CAPSULE | ORAL | 0 refills | Status: AC

## 2024-05-27 MED ORDER — EMPAGLIFLOZIN 10 MG PO TABS: 10.0000 mg | ORAL_TABLET | Freq: Every day | ORAL | 1 refills | Status: AC

## 2024-05-29 ENCOUNTER — Encounter: Payer: Self-pay | Admitting: Internal Medicine

## 2024-05-31 NOTE — Telephone Encounter (Signed)
 Spoke with patient, she does not qualify for the assistance program but she may try to setup the payment plan with insurance to get the cost down. Provided patient with steps on how to do so, this can take some time. Provided 4 weeks of samples and scheduled a follow up at that time to assess.

## 2024-06-08 NOTE — Progress Notes (Signed)
 Erica Sutton                                          MRN: [id number]   06/08/2024   The [facility]BCI Quality Team Specialist reviewed this patient medical record for the purposes of chart review for care gap closure. The following were reviewed: abstraction for care gap closure-glycemic status assessment.    VBCI Quality Team

## 2024-06-10 ENCOUNTER — Other Ambulatory Visit

## 2024-06-10 ENCOUNTER — Ambulatory Visit: Payer: Self-pay | Admitting: Internal Medicine

## 2024-06-10 DIAGNOSIS — R899 Unspecified abnormal finding in specimens from other organs, systems and tissues: Secondary | ICD-10-CM

## 2024-06-10 DIAGNOSIS — R809 Proteinuria, unspecified: Secondary | ICD-10-CM | POA: Diagnosis not present

## 2024-06-10 DIAGNOSIS — E559 Vitamin D deficiency, unspecified: Secondary | ICD-10-CM | POA: Diagnosis not present

## 2024-06-10 DIAGNOSIS — E1129 Type 2 diabetes mellitus with other diabetic kidney complication: Secondary | ICD-10-CM | POA: Diagnosis not present

## 2024-06-10 LAB — BASIC METABOLIC PANEL WITH GFR
BUN: 20 mg/dL (ref 6–23)
CO2: 29 meq/L (ref 19–32)
Calcium: 9.8 mg/dL (ref 8.4–10.5)
Chloride: 102 meq/L (ref 96–112)
Creatinine, Ser: 1.37 mg/dL — ABNORMAL HIGH (ref 0.40–1.20)
GFR: 39.93 mL/min — ABNORMAL LOW (ref >60.00)
Glucose, Bld: 149 mg/dL — ABNORMAL HIGH (ref 70–99)
Potassium: 4.2 meq/L (ref 3.5–5.1)
Sodium: 139 meq/L (ref 135–145)

## 2024-06-10 LAB — VITAMIN D 25 HYDROXY (VIT D DEFICIENCY, FRACTURES): VITD: 33.76 ng/mL (ref 30.00–100.00)

## 2024-06-21 ENCOUNTER — Other Ambulatory Visit

## 2024-06-21 DIAGNOSIS — E1129 Type 2 diabetes mellitus with other diabetic kidney complication: Secondary | ICD-10-CM

## 2024-06-21 NOTE — Progress Notes (Signed)
 06/21/2024 Name: Erica Sutton MRN: [id number] DOB: [birth year]  Chief Complaint  Patient presents with   Medication Management   Diabetes    Erica Sutton is a 68 y.o. year old female who presented for a telephone visit.   They were referred to the pharmacist by their PCP for assistance in managing medication access.    Subjective:  Care Team: Primary Care Provider: Theophilus Andrews, Tully GRADE, MD   Medication Access/Adherence  Current Pharmacy:  [facility] [phone] - [place], [place] - [address] AT [facility] [address] [place] [address] Phone: [phone] Fax: [phone]   Patient reports affordability concerns with their medications: Yes  - Jardiance  Patient reports access/transportation concerns to their pharmacy: No  Patient reports adherence concerns with their medications:  No     Diabetes:  Current medications: Jardiance  10mg  Medications tried in the past: None  -Is taking provided samples and has no concerns with side effects or use at this time -Reports she contacted insurance company and can setup a payment plan for Jardiance  to make it affordable as of 07/22/24 -Needs more samples to stay on for remainder of year   Last urinary albumin/creatinine ratio:  Lab Results  Component Value Date   MICRALBCREAT 153.5 (H) 05/26/2024   MICRALBCREAT 17 06/30/2020   Last eye exam:  Lab Results  Component Value Date   HMDIABEYEEXA No Retinopathy 04/29/2023   Last foot exam: 05/26/2024 Tobacco Use:  Tobacco Use: Medium Risk (05/26/2024)   Patient History    Smoking Tobacco Use: Former    Smokeless Tobacco Use: Never    Passive Exposure: Not on file     Objective:  Lab Results  Component Value Date   HGBA1C 7.1 (H) 05/26/2024    Lab Results  Component Value Date   CREATININE 1.37 (H) 06/10/2024   BUN 20 06/10/2024   NA 139 06/10/2024   K 4.2 06/10/2024   CL 102 06/10/2024   CO2 29  06/10/2024    Lab Results  Component Value Date   CHOL 169 05/26/2024   HDL 53.70 05/26/2024   LDLCALC 74 05/26/2024   LDLDIRECT 149.0 05/24/2013   TRIG 205.0 (H) 05/26/2024   CHOLHDL 3 05/26/2024    Medications Reviewed Today     Reviewed by Lionell Jon DEL, RPH (Pharmacist) on 06/21/24 at 0944  Med List Status: <None>   Medication Order Taking? Sig Documenting Provider Last Dose Status Informant  Accu-Chek FastClix Lancets MISC [id number]  Use as directed once a day Luke Chiquita SAUNDERS, DO  Active   ACCU-CHEK GUIDE test strip [id number]  Use as instructed once a day Luke Chiquita SAUNDERS, DO  Active   amLODipine  (NORVASC ) 5 MG tablet [id number]  TAKE 1 TABLET(5 MG) BY MOUTH DAILY Theophilus Andrews, Tully GRADE, MD  Active   benazepril  (LOTENSIN ) 40 MG tablet [id number]  TAKE 1 TABLET(40 MG) BY MOUTH DAILY Theophilus Andrews, Tully GRADE, MD  Active   Blood Glucose Monitoring Suppl (ACCU-CHEK GUIDE ME) w/Device KIT [id number]  1 each by Does not apply route as directed. Luke Chiquita SAUNDERS, DO  Active   empagliflozin  (JARDIANCE ) 10 MG TABS tablet [id number]  Take 1 tablet (10 mg total) by mouth daily. Theophilus Andrews, Tully GRADE, MD  Active   hydrochlorothiazide  (HYDRODIURIL ) 25 MG tablet [id number]  TAKE 1 TABLET(25 MG) BY MOUTH DAILY Theophilus Andrews, Tully GRADE, MD  Active   Multiple Vitamins-Minerals (MULTIVITAMIN WITH MINERALS) tablet [id number]  Take  1 tablet by mouth daily. [provider]  Active Self  rosuvastatin  (CRESTOR ) 20 MG tablet [id number]  TAKE 1 TABLET(20 MG) BY MOUTH DAILY Theophilus Andrews, Tully GRADE, MD  Active   Vitamin D , Ergocalciferol , (DRISDOL ) 1.25 MG (50000 UNIT) CAPS capsule [id number]  Take 1 capsule (50,000 Units total) by mouth every 7 (seven) days for 12 doses. Theophilus Andrews, Tully GRADE, MD  Active               Assessment/Plan:   Diabetes: - Currently uncontrolled; goal A1c <7%.  -Counseled to continue Jardiance  10mg  once daily. Providing more samples to maintain through  end of year until patient can setup a payment plan for 2026 starting 07/22/24.   Follow Up Plan: 4 weeks  Jon VEAR Lindau, PharmD Clinical Pharmacist [phone]

## 2024-06-28 ENCOUNTER — Other Ambulatory Visit (INDEPENDENT_AMBULATORY_CARE_PROVIDER_SITE_OTHER)

## 2024-06-28 ENCOUNTER — Ambulatory Visit: Payer: Self-pay | Admitting: Internal Medicine

## 2024-06-28 DIAGNOSIS — R899 Unspecified abnormal finding in specimens from other organs, systems and tissues: Secondary | ICD-10-CM | POA: Diagnosis not present

## 2024-06-28 LAB — BASIC METABOLIC PANEL WITH GFR
BUN: 22 mg/dL (ref 6–23)
CO2: 29 meq/L (ref 19–32)
Calcium: 10.2 mg/dL (ref 8.4–10.5)
Chloride: 102 meq/L (ref 96–112)
Creatinine, Ser: 1.25 mg/dL — ABNORMAL HIGH (ref 0.40–1.20)
GFR: 44.56 mL/min — ABNORMAL LOW (ref >60.00)
Glucose, Bld: 122 mg/dL — ABNORMAL HIGH (ref 70–99)
Potassium: 4.5 meq/L (ref 3.5–5.1)
Sodium: 140 meq/L (ref 135–145)

## 2024-06-29 ENCOUNTER — Telehealth: Payer: Self-pay | Admitting: *Deleted

## 2024-06-29 NOTE — Telephone Encounter (Signed)
 Copied from CRM #[id number]. Topic: Clinical - Lab/Test Results >> Jun 29, 2024  2:33 PM Larissa S wrote: Reason for CRM: Relayed lab results per provider note. Patient verbalized understanding and wants to know if she is to continue her medication. Requesting a callback.

## 2024-06-29 NOTE — Telephone Encounter (Signed)
 See lab result note.

## 2024-07-28 ENCOUNTER — Other Ambulatory Visit

## 2024-07-28 DIAGNOSIS — E1129 Type 2 diabetes mellitus with other diabetic kidney complication: Secondary | ICD-10-CM

## 2024-07-28 DIAGNOSIS — R809 Proteinuria, unspecified: Secondary | ICD-10-CM

## 2024-07-28 NOTE — Progress Notes (Signed)
 "  07/28/2024 Name: Erica Sutton MRN: [id number] DOB: [birth year]  Chief Complaint  Patient presents with   Medication Management   Medication Assistance   Diabetes    Audryna Wendt is a 69 y.o. year old female who presented for a telephone visit.   They were referred to the pharmacist by their PCP for assistance in managing medication access.    Subjective:  Care Team: Primary Care Provider: Theophilus Andrews, Tully GRADE, MD   Medication Access/Adherence  Current Pharmacy:  [facility] [phone] - [place], [place] - [address] AT [facility] [address] [place] [address] Phone: [phone] Fax: [phone]   Patient reports affordability concerns with their medications: Yes  - Jardiance  Patient reports access/transportation concerns to their pharmacy: No  Patient reports adherence concerns with their medications:  No     Diabetes:  Current medications: Jardiance  10mg  Medications tried in the past: None  -Is taking provided samples and has no concerns with side effects or use at this time -Has called her insurance company and setup a payment plan for Jardiance , plans to pick up at pharmacy once it takes effect this month   Last urinary albumin/creatinine ratio:  Lab Results  Component Value Date   MICRALBCREAT 153.5 (H) 05/26/2024   MICRALBCREAT 17 06/30/2020   Last eye exam:  Lab Results  Component Value Date   HMDIABEYEEXA No Retinopathy 04/29/2023   Last foot exam: 05/26/2024 Tobacco Use:  Tobacco Use: Medium Risk (05/26/2024)   Patient History    Smoking Tobacco Use: Former    Smokeless Tobacco Use: Never    Passive Exposure: Not on file     Objective:  Lab Results  Component Value Date   HGBA1C 7.1 (H) 05/26/2024    Lab Results  Component Value Date   CREATININE 1.25 (H) 06/28/2024   BUN 22 06/28/2024   NA 140 06/28/2024   K 4.5 06/28/2024   CL 102 06/28/2024   CO2 29 06/28/2024    Lab  Results  Component Value Date   CHOL 169 05/26/2024   HDL 53.70 05/26/2024   LDLCALC 74 05/26/2024   LDLDIRECT 149.0 05/24/2013   TRIG 205.0 (H) 05/26/2024   CHOLHDL 3 05/26/2024    Medications Reviewed Today     Reviewed by Lionell Jon DEL, RPH (Pharmacist) on 07/28/24 at 0848  Med List Status: <None>   Medication Order Taking? Sig Documenting Provider Last Dose Status Informant  Accu-Chek FastClix Lancets MISC [id number]  Use as directed once a day Luke Chiquita SAUNDERS, DO  Active   ACCU-CHEK GUIDE test strip [id number]  Use as instructed once a day Luke Chiquita SAUNDERS, DO  Active   amLODipine  (NORVASC ) 5 MG tablet [id number]  TAKE 1 TABLET(5 MG) BY MOUTH DAILY Theophilus Andrews, Tully GRADE, MD  Active   benazepril  (LOTENSIN ) 40 MG tablet [id number]  TAKE 1 TABLET(40 MG) BY MOUTH DAILY Theophilus Andrews, Tully GRADE, MD  Active   Blood Glucose Monitoring Suppl (ACCU-CHEK GUIDE ME) w/Device KIT [id number]  1 each by Does not apply route as directed. Luke Chiquita SAUNDERS, DO  Active   empagliflozin  (JARDIANCE ) 10 MG TABS tablet [id number]  Take 1 tablet (10 mg total) by mouth daily. Theophilus Andrews, Tully GRADE, MD  Active   hydrochlorothiazide  (HYDRODIURIL ) 25 MG tablet [id number]  TAKE 1 TABLET(25 MG) BY MOUTH DAILY Theophilus Andrews, Tully GRADE, MD  Active   Multiple Vitamins-Minerals (MULTIVITAMIN WITH MINERALS) tablet [id number]  Take 1  tablet by mouth daily. [provider]  Active Self  rosuvastatin  (CRESTOR ) 20 MG tablet [id number]  TAKE 1 TABLET(20 MG) BY MOUTH DAILY Theophilus Andrews, Tully GRADE, MD  Active   Vitamin D , Ergocalciferol , (DRISDOL ) 1.25 MG (50000 UNIT) CAPS capsule [id number]  Take 1 capsule (50,000 Units total) by mouth every 7 (seven) days for 12 doses. Theophilus Andrews, Tully GRADE, MD  Active               Assessment/Plan:   Diabetes: - Currently uncontrolled; goal A1c <7%.  -Counseled to continue Jardiance  10mg  once daily. Provided 2 weeks of samples while patient waits for her payment  plan to kick in on her insurance   Follow Up Plan: Pt to reach out as needed if any new medication access concerns arise  Jon VEAR Lindau, PharmD Clinical Pharmacist [phone]  "

## 2024-08-03 ENCOUNTER — Other Ambulatory Visit: Payer: Self-pay | Admitting: Internal Medicine

## 2024-08-03 DIAGNOSIS — Z1231 Encounter for screening mammogram for malignant neoplasm of breast: Secondary | ICD-10-CM

## 2024-08-18 ENCOUNTER — Encounter: Payer: Self-pay | Admitting: Internal Medicine

## 2024-08-18 ENCOUNTER — Other Ambulatory Visit: Payer: Self-pay | Admitting: Internal Medicine

## 2024-08-19 ENCOUNTER — Ambulatory Visit
Admission: RE | Admit: 2024-08-19 | Discharge: 2024-08-19 | Disposition: A | Discharge status: Home / Self Care | Source: Ambulatory Visit | Attending: Internal Medicine | Admitting: Internal Medicine

## 2024-08-19 DIAGNOSIS — Z1231 Encounter for screening mammogram for malignant neoplasm of breast: Secondary | ICD-10-CM

## 2024-08-23 ENCOUNTER — Other Ambulatory Visit (HOSPITAL_BASED_OUTPATIENT_CLINIC_OR_DEPARTMENT_OTHER)

## 2024-09-08 ENCOUNTER — Other Ambulatory Visit (HOSPITAL_BASED_OUTPATIENT_CLINIC_OR_DEPARTMENT_OTHER)
